# Patient Record
Sex: Female | Born: 1950 | Race: White | Hispanic: No | Smoking: Never smoker
Health system: Southern US, Community
[De-identification: ages and names within clinical notes are randomized; demographics above are authoritative.]

## PROBLEM LIST (undated history)

## (undated) DIAGNOSIS — Z974 Presence of external hearing-aid: Secondary | ICD-10-CM

## (undated) DIAGNOSIS — R198 Other specified symptoms and signs involving the digestive system and abdomen: Secondary | ICD-10-CM

## (undated) DIAGNOSIS — C55 Malignant neoplasm of uterus, part unspecified: Secondary | ICD-10-CM

## (undated) DIAGNOSIS — C801 Malignant (primary) neoplasm, unspecified: Secondary | ICD-10-CM

## (undated) HISTORY — PX: ABDOMINAL HYSTERECTOMY: SHX81

## (undated) HISTORY — PX: TONSILLECTOMY: SUR1361

## (undated) HISTORY — DX: Malignant (primary) neoplasm, unspecified: C80.1

## (undated) HISTORY — DX: Malignant neoplasm of uterus, part unspecified: C55

## (undated) HISTORY — DX: Other specified symptoms and signs involving the digestive system and abdomen: R19.8

---

## 2010-12-06 DIAGNOSIS — C801 Malignant (primary) neoplasm, unspecified: Secondary | ICD-10-CM

## 2010-12-06 HISTORY — DX: Malignant (primary) neoplasm, unspecified: C80.1

## 2011-04-06 ENCOUNTER — Ambulatory Visit: Payer: Self-pay | Admitting: Gynecologic Oncology

## 2011-04-30 ENCOUNTER — Ambulatory Visit: Payer: Self-pay | Admitting: Obstetrics and Gynecology

## 2011-05-04 ENCOUNTER — Ambulatory Visit: Payer: Self-pay | Admitting: Gynecologic Oncology

## 2011-05-05 LAB — CA 125: CA 125: 12.2 U/mL (ref 0.0–34.0)

## 2011-05-07 ENCOUNTER — Ambulatory Visit: Payer: Self-pay | Admitting: Gynecologic Oncology

## 2011-05-18 ENCOUNTER — Ambulatory Visit: Payer: Self-pay | Admitting: Obstetrics and Gynecology

## 2011-05-25 ENCOUNTER — Inpatient Hospital Stay: Payer: Self-pay | Admitting: Gynecologic Oncology

## 2011-05-25 ENCOUNTER — Encounter: Payer: Self-pay | Admitting: Oncology

## 2011-06-06 ENCOUNTER — Ambulatory Visit: Payer: Self-pay | Admitting: Gynecologic Oncology

## 2011-06-24 ENCOUNTER — Ambulatory Visit: Payer: Self-pay | Admitting: Vascular Surgery

## 2011-07-07 ENCOUNTER — Ambulatory Visit: Payer: Self-pay | Admitting: Gynecologic Oncology

## 2011-08-07 ENCOUNTER — Ambulatory Visit: Payer: Self-pay | Admitting: Gynecologic Oncology

## 2011-09-06 ENCOUNTER — Ambulatory Visit: Payer: Self-pay | Admitting: Gynecologic Oncology

## 2011-09-26 ENCOUNTER — Emergency Department: Payer: Self-pay | Admitting: Emergency Medicine

## 2011-09-28 ENCOUNTER — Inpatient Hospital Stay: Payer: Self-pay | Admitting: Internal Medicine

## 2011-10-07 ENCOUNTER — Ambulatory Visit: Payer: Self-pay | Admitting: Gynecologic Oncology

## 2011-11-06 ENCOUNTER — Ambulatory Visit: Payer: Self-pay | Admitting: Gynecologic Oncology

## 2011-12-07 ENCOUNTER — Ambulatory Visit: Payer: Self-pay | Admitting: Gynecologic Oncology

## 2011-12-07 DIAGNOSIS — R198 Other specified symptoms and signs involving the digestive system and abdomen: Secondary | ICD-10-CM

## 2011-12-07 HISTORY — DX: Other specified symptoms and signs involving the digestive system and abdomen: R19.8

## 2011-12-08 ENCOUNTER — Ambulatory Visit: Payer: Self-pay | Admitting: Obstetrics and Gynecology

## 2011-12-08 LAB — CBC CANCER CENTER
Basophil #: 0 x10 3/mm (ref 0.0–0.1)
Eosinophil #: 0.5 x10 3/mm (ref 0.0–0.7)
HCT: 33.7 % — ABNORMAL LOW (ref 35.0–47.0)
HGB: 11.8 g/dL — ABNORMAL LOW (ref 12.0–16.0)
Lymphocyte %: 12.5 %
MCH: 31.6 pg (ref 26.0–34.0)
MCHC: 35 g/dL (ref 32.0–36.0)
Monocyte %: 6.9 %
Neutrophil #: 2.4 x10 3/mm (ref 1.4–6.5)
Neutrophil %: 65.9 %
Platelet: 188 x10 3/mm (ref 150–440)
RBC: 3.73 10*6/uL — ABNORMAL LOW (ref 3.80–5.20)
RDW: 13.4 % (ref 11.5–14.5)

## 2011-12-08 LAB — COMPREHENSIVE METABOLIC PANEL
Alkaline Phosphatase: 109 U/L (ref 50–136)
Anion Gap: 8 (ref 7–16)
BUN: 17 mg/dL (ref 7–18)
Co2: 28 mmol/L (ref 21–32)
Creatinine: 0.68 mg/dL (ref 0.60–1.30)
EGFR (African American): >60
EGFR (Non-African Amer.): >60
Glucose: 83 mg/dL (ref 65–99)
Osmolality: 276 (ref 275–301)
SGOT(AST): 27 U/L (ref 15–37)
SGPT (ALT): 44 U/L
Total Protein: 6.7 g/dL (ref 6.4–8.2)

## 2011-12-15 LAB — CBC CANCER CENTER
Eosinophil #: 0.3 x10 3/mm (ref 0.0–0.7)
Eosinophil %: 9.2 %
MCH: 31.2 pg (ref 26.0–34.0)
Monocyte #: 0.3 x10 3/mm (ref 0.0–0.7)
Neutrophil #: 2.4 x10 3/mm (ref 1.4–6.5)
Neutrophil %: 68.7 %
Platelet: 183 x10 3/mm (ref 150–440)
RBC: 3.76 10*6/uL — ABNORMAL LOW (ref 3.80–5.20)
RDW: 13.3 % (ref 11.5–14.5)

## 2012-01-07 ENCOUNTER — Ambulatory Visit: Payer: Self-pay | Admitting: Gynecologic Oncology

## 2012-02-03 ENCOUNTER — Ambulatory Visit: Payer: Self-pay | Admitting: Gastroenterology

## 2012-02-04 ENCOUNTER — Ambulatory Visit: Payer: Self-pay | Admitting: Gynecologic Oncology

## 2012-02-07 LAB — CBC CANCER CENTER
Basophil %: 0.3 %
Eosinophil %: 3.8 %
HGB: 11.2 g/dL — ABNORMAL LOW (ref 12.0–16.0)
Lymphocyte #: 0.3 x10 3/mm — ABNORMAL LOW (ref 1.0–3.6)
MCV: 89 fL (ref 80–100)
Monocyte #: 0.3 x10 3/mm (ref 0.0–0.7)
Neutrophil %: 78.7 %
Platelet: 191 x10 3/mm (ref 150–440)
RBC: 3.59 10*6/uL — ABNORMAL LOW (ref 3.80–5.20)
WBC: 3.6 x10 3/mm (ref 3.6–11.0)

## 2012-02-07 LAB — COMPREHENSIVE METABOLIC PANEL
Albumin: 3.6 g/dL (ref 3.4–5.0)
Anion Gap: 9 (ref 7–16)
BUN: 12 mg/dL (ref 7–18)
Calcium, Total: 8.4 mg/dL — ABNORMAL LOW (ref 8.5–10.1)
Glucose: 91 mg/dL (ref 65–99)
Osmolality: 282 (ref 275–301)
Potassium: 3.4 mmol/L — ABNORMAL LOW (ref 3.5–5.1)
Sodium: 142 mmol/L (ref 136–145)
Total Protein: 6.6 g/dL (ref 6.4–8.2)

## 2012-02-08 LAB — CA 125: CA 125: 7 U/mL (ref 0.0–34.0)

## 2012-03-06 ENCOUNTER — Ambulatory Visit: Payer: Self-pay | Admitting: Gynecologic Oncology

## 2012-04-24 ENCOUNTER — Ambulatory Visit: Payer: Self-pay | Admitting: Oncology

## 2012-04-24 LAB — URINALYSIS, COMPLETE
Bacteria: NONE SEEN
Glucose,UR: NEGATIVE mg/dL (ref 0–75)
Ketone: NEGATIVE
Leukocyte Esterase: NEGATIVE
Protein: NEGATIVE
RBC,UR: NONE SEEN /HPF (ref 0–5)
Squamous Epithelial: NONE SEEN
WBC UR: <1 /HPF (ref 0–5)

## 2012-04-28 LAB — BASIC METABOLIC PANEL
BUN: 14 mg/dL (ref 7–18)
Chloride: 102 mmol/L (ref 98–107)
Co2: 26 mmol/L (ref 21–32)
EGFR (African American): >60
EGFR (Non-African Amer.): >60
Glucose: 95 mg/dL (ref 65–99)
Osmolality: 276 (ref 275–301)
Potassium: 4.2 mmol/L (ref 3.5–5.1)
Sodium: 138 mmol/L (ref 136–145)

## 2012-04-28 LAB — CBC CANCER CENTER
Basophil %: 1.1 %
Eosinophil #: 0.2 x10 3/mm (ref 0.0–0.7)
Eosinophil %: 5.6 %
HCT: 37.9 % (ref 35.0–47.0)
Lymphocyte %: 17.1 %
Monocyte #: 0.3 x10 3/mm (ref 0.2–0.9)
Monocyte %: 8.4 %
Neutrophil #: 2 x10 3/mm (ref 1.4–6.5)
Platelet: 199 x10 3/mm (ref 150–440)
RBC: 4.22 10*6/uL (ref 3.80–5.20)
RDW: 12.9 % (ref 11.5–14.5)
WBC: 3 x10 3/mm — ABNORMAL LOW (ref 3.6–11.0)

## 2012-04-28 LAB — MAGNESIUM: Magnesium: 2 mg/dL

## 2012-05-06 ENCOUNTER — Ambulatory Visit: Payer: Self-pay | Admitting: Oncology

## 2012-05-16 LAB — COMPREHENSIVE METABOLIC PANEL
Albumin: 3.6 g/dL (ref 3.4–5.0)
Alkaline Phosphatase: 72 U/L (ref 50–136)
BUN: 12 mg/dL (ref 7–18)
Chloride: 104 mmol/L (ref 98–107)
Co2: 25 mmol/L (ref 21–32)
EGFR (African American): >60
Glucose: 100 mg/dL — ABNORMAL HIGH (ref 65–99)
SGOT(AST): 30 U/L (ref 15–37)
SGPT (ALT): 32 U/L
Sodium: 140 mmol/L (ref 136–145)

## 2012-05-16 LAB — CBC CANCER CENTER
Eosinophil #: 0.2 x10 3/mm (ref 0.0–0.7)
Eosinophil %: 4.6 %
Lymphocyte #: 0.5 x10 3/mm — ABNORMAL LOW (ref 1.0–3.6)
MCH: 29.8 pg (ref 26.0–34.0)
MCHC: 32.7 g/dL (ref 32.0–36.0)
Monocyte #: 0.3 x10 3/mm (ref 0.2–0.9)
Neutrophil #: 2.6 x10 3/mm (ref 1.4–6.5)
Platelet: 204 x10 3/mm (ref 150–440)
RBC: 4.17 10*6/uL (ref 3.80–5.20)
RDW: 13.6 % (ref 11.5–14.5)
WBC: 3.5 x10 3/mm — ABNORMAL LOW (ref 3.6–11.0)

## 2012-06-05 ENCOUNTER — Ambulatory Visit: Payer: Self-pay | Admitting: Oncology

## 2012-06-08 ENCOUNTER — Inpatient Hospital Stay: Payer: Self-pay | Admitting: Family Medicine

## 2012-06-08 LAB — URINALYSIS, COMPLETE
Bilirubin,UR: NEGATIVE
Blood: NEGATIVE
Granular Cast: 83
Hyaline Cast: 17
Ketone: NEGATIVE
Ph: 5 (ref 4.5–8.0)
RBC,UR: 1 /HPF (ref 0–5)
Specific Gravity: 1.017 (ref 1.003–1.030)
Squamous Epithelial: 2

## 2012-06-08 LAB — CBC
HCT: 40.7 % (ref 35.0–47.0)
RDW: 13.4 % (ref 11.5–14.5)

## 2012-06-08 LAB — COMPREHENSIVE METABOLIC PANEL
Alkaline Phosphatase: 89 U/L (ref 50–136)
Bilirubin,Total: 0.4 mg/dL (ref 0.2–1.0)
Calcium, Total: 9.5 mg/dL (ref 8.5–10.1)
Co2: 23 mmol/L (ref 21–32)
Creatinine: 0.96 mg/dL (ref 0.60–1.30)
EGFR (African American): >60
EGFR (Non-African Amer.): >60
Glucose: 122 mg/dL — ABNORMAL HIGH (ref 65–99)
Osmolality: 280 (ref 275–301)
SGOT(AST): 28 U/L (ref 15–37)
Sodium: 139 mmol/L (ref 136–145)

## 2012-06-10 LAB — CBC WITH DIFFERENTIAL/PLATELET
Eosinophil %: 4.8 %
Lymphocyte #: 0.7 10*3/uL — ABNORMAL LOW (ref 1.0–3.6)
MCHC: 34.3 g/dL (ref 32.0–36.0)
MCV: 90 fL (ref 80–100)
Monocyte %: 9.8 %
Neutrophil %: 56.6 %
Platelet: 168 10*3/uL (ref 150–440)
RBC: 3.45 10*6/uL — ABNORMAL LOW (ref 3.80–5.20)
RDW: 13.3 % (ref 11.5–14.5)
WBC: 2.3 10*3/uL — ABNORMAL LOW (ref 3.6–11.0)

## 2012-06-10 LAB — BASIC METABOLIC PANEL
BUN: 5 mg/dL — ABNORMAL LOW (ref 7–18)
Calcium, Total: 8.1 mg/dL — ABNORMAL LOW (ref 8.5–10.1)
Co2: 24 mmol/L (ref 21–32)
Creatinine: 0.69 mg/dL (ref 0.60–1.30)
EGFR (African American): >60
Potassium: 3.6 mmol/L (ref 3.5–5.1)
Sodium: 144 mmol/L (ref 136–145)

## 2012-06-11 LAB — CBC WITH DIFFERENTIAL/PLATELET
Basophil #: 0 10*3/uL (ref 0.0–0.1)
Basophil %: 0.8 %
Eosinophil #: 0.1 10*3/uL (ref 0.0–0.7)
Eosinophil %: 5.3 %
HCT: 31.3 % — ABNORMAL LOW (ref 35.0–47.0)
HGB: 10.8 g/dL — ABNORMAL LOW (ref 12.0–16.0)
Lymphocyte #: 0.6 10*3/uL — ABNORMAL LOW (ref 1.0–3.6)
MCH: 30.7 pg (ref 26.0–34.0)
MCV: 89 fL (ref 80–100)
Monocyte #: 0.2 x10 3/mm (ref 0.2–0.9)
Monocyte %: 9.7 %
Neutrophil #: 1.6 10*3/uL (ref 1.4–6.5)
RBC: 3.52 10*6/uL — ABNORMAL LOW (ref 3.80–5.20)
WBC: 2.5 10*3/uL — ABNORMAL LOW (ref 3.6–11.0)

## 2012-06-11 LAB — BASIC METABOLIC PANEL
Anion Gap: 6 — ABNORMAL LOW (ref 7–16)
Calcium, Total: 8.5 mg/dL (ref 8.5–10.1)
Co2: 28 mmol/L (ref 21–32)
EGFR (African American): >60
Osmolality: 283 (ref 275–301)

## 2012-06-12 LAB — CBC WITH DIFFERENTIAL/PLATELET
Basophil #: 0 10*3/uL (ref 0.0–0.1)
Basophil %: 0.8 %
Eosinophil #: 0.2 10*3/uL (ref 0.0–0.7)
Eosinophil %: 6 %
HCT: 33.8 % — ABNORMAL LOW (ref 35.0–47.0)
Lymphocyte #: 0.7 10*3/uL — ABNORMAL LOW (ref 1.0–3.6)
Lymphocyte %: 21.4 %
MCH: 30.4 pg (ref 26.0–34.0)
MCHC: 33.9 g/dL (ref 32.0–36.0)
MCV: 90 fL (ref 80–100)
Neutrophil #: 1.9 10*3/uL (ref 1.4–6.5)
RDW: 13.2 % (ref 11.5–14.5)

## 2012-06-12 LAB — BASIC METABOLIC PANEL
Anion Gap: 11 (ref 7–16)
BUN: 6 mg/dL — ABNORMAL LOW (ref 7–18)
Calcium, Total: 8.8 mg/dL (ref 8.5–10.1)
Chloride: 106 mmol/L (ref 98–107)
Co2: 28 mmol/L (ref 21–32)
Creatinine: 0.72 mg/dL (ref 0.60–1.30)
EGFR (African American): >60
Osmolality: 285 (ref 275–301)
Sodium: 145 mmol/L (ref 136–145)

## 2012-06-13 LAB — CBC WITH DIFFERENTIAL/PLATELET
Basophil #: 0 10*3/uL (ref 0.0–0.1)
Eosinophil #: 0.2 10*3/uL (ref 0.0–0.7)
HCT: 33.6 % — ABNORMAL LOW (ref 35.0–47.0)
HGB: 11.4 g/dL — ABNORMAL LOW (ref 12.0–16.0)
Lymphocyte %: 15.8 %
MCH: 30.1 pg (ref 26.0–34.0)
MCHC: 33.9 g/dL (ref 32.0–36.0)
Monocyte %: 8 %
Neutrophil #: 2.1 10*3/uL (ref 1.4–6.5)
Neutrophil %: 69.5 %
Platelet: 190 10*3/uL (ref 150–440)
RDW: 13.2 % (ref 11.5–14.5)

## 2012-06-13 LAB — BASIC METABOLIC PANEL
BUN: 8 mg/dL (ref 7–18)
Calcium, Total: 8.4 mg/dL — ABNORMAL LOW (ref 8.5–10.1)
Chloride: 108 mmol/L — ABNORMAL HIGH (ref 98–107)
Co2: 29 mmol/L (ref 21–32)
Creatinine: 0.91 mg/dL (ref 0.60–1.30)
EGFR (African American): >60
Potassium: 3.8 mmol/L (ref 3.5–5.1)

## 2012-06-14 LAB — CBC WITH DIFFERENTIAL/PLATELET
Basophil #: 0 10*3/uL (ref 0.0–0.1)
Basophil %: 1 %
Eosinophil %: 7.2 %
HCT: 31.3 % — ABNORMAL LOW (ref 35.0–47.0)
Lymphocyte #: 0.6 10*3/uL — ABNORMAL LOW (ref 1.0–3.6)
MCV: 89 fL (ref 80–100)
Monocyte #: 0.3 x10 3/mm (ref 0.2–0.9)
Monocyte %: 9.9 %
Neutrophil #: 1.6 10*3/uL (ref 1.4–6.5)
Neutrophil %: 59.9 %
Platelet: 171 10*3/uL (ref 150–440)
RBC: 3.53 10*6/uL — ABNORMAL LOW (ref 3.80–5.20)
RDW: 13.2 % (ref 11.5–14.5)
WBC: 2.7 10*3/uL — ABNORMAL LOW (ref 3.6–11.0)

## 2012-06-14 LAB — COMPREHENSIVE METABOLIC PANEL
Albumin: 3 g/dL — ABNORMAL LOW (ref 3.4–5.0)
Alkaline Phosphatase: 75 U/L (ref 50–136)
Anion Gap: 6 — ABNORMAL LOW (ref 7–16)
BUN: 5 mg/dL — ABNORMAL LOW (ref 7–18)
Calcium, Total: 8.2 mg/dL — ABNORMAL LOW (ref 8.5–10.1)
EGFR (Non-African Amer.): >60
Glucose: 78 mg/dL (ref 65–99)
SGOT(AST): 40 U/L — ABNORMAL HIGH (ref 15–37)
SGPT (ALT): 42 U/L
Total Protein: 5.7 g/dL — ABNORMAL LOW (ref 6.4–8.2)

## 2012-06-14 LAB — LIPASE, BLOOD: Lipase: 60 U/L — ABNORMAL LOW (ref 73–393)

## 2012-06-15 LAB — CBC WITH DIFFERENTIAL/PLATELET
Basophil #: 0 x10 3/mm 3
Basophil %: 0.3 %
Eosinophil #: 0.2 x10 3/mm 3
Eosinophil %: 3.7 %
HCT: 33.1 % — ABNORMAL LOW
HGB: 11.3 g/dL — ABNORMAL LOW
Lymphocyte %: 12 %
Lymphs Abs: 0.6 x10 3/mm 3 — ABNORMAL LOW
MCH: 30.3 pg
MCHC: 34.1 g/dL
MCV: 89 fL
Monocyte #: 0.4 "x10 3/mm "
Monocyte %: 6.9 %
Neutrophil #: 4 x10 3/mm 3
Neutrophil %: 77.1 %
Platelet: 174 x10 3/mm 3
RBC: 3.72 X10 6/mm 3 — ABNORMAL LOW
RDW: 13.3 %
WBC: 5.2 x10 3/mm 3

## 2012-06-15 LAB — BASIC METABOLIC PANEL
Anion Gap: 6 — ABNORMAL LOW (ref 7–16)
BUN: 6 mg/dL — ABNORMAL LOW (ref 7–18)
Chloride: 107 mmol/L (ref 98–107)
Creatinine: 0.74 mg/dL (ref 0.60–1.30)
Osmolality: 283 (ref 275–301)
Sodium: 144 mmol/L (ref 136–145)

## 2012-06-16 LAB — BASIC METABOLIC PANEL
Anion Gap: 7 (ref 7–16)
BUN: 8 mg/dL (ref 7–18)
Calcium, Total: 8.8 mg/dL (ref 8.5–10.1)
Co2: 29 mmol/L (ref 21–32)
Creatinine: 0.76 mg/dL (ref 0.60–1.30)
EGFR (African American): >60
Glucose: 88 mg/dL (ref 65–99)
Sodium: 144 mmol/L (ref 136–145)

## 2012-06-16 LAB — CBC WITH DIFFERENTIAL/PLATELET
Basophil #: 0.1 10*3/uL (ref 0.0–0.1)
Eosinophil #: 0.2 10*3/uL (ref 0.0–0.7)
Eosinophil %: 5.3 %
Lymphocyte #: 0.6 10*3/uL — ABNORMAL LOW (ref 1.0–3.6)
Lymphocyte %: 18.4 %
MCH: 30.8 pg (ref 26.0–34.0)
MCHC: 34.7 g/dL (ref 32.0–36.0)
Monocyte #: 0.3 x10 3/mm (ref 0.2–0.9)
Neutrophil #: 2.1 10*3/uL (ref 1.4–6.5)
Neutrophil %: 65.8 %
RBC: 3.64 10*6/uL — ABNORMAL LOW (ref 3.80–5.20)
RDW: 13.1 % (ref 11.5–14.5)
WBC: 3.3 10*3/uL — ABNORMAL LOW (ref 3.6–11.0)

## 2012-06-17 LAB — COMPREHENSIVE METABOLIC PANEL
Alkaline Phosphatase: 128 U/L (ref 50–136)
Anion Gap: 8 (ref 7–16)
Bilirubin,Total: 0.3 mg/dL (ref 0.2–1.0)
Calcium, Total: 8.5 mg/dL (ref 8.5–10.1)
Chloride: 108 mmol/L — ABNORMAL HIGH (ref 98–107)
Creatinine: 0.79 mg/dL (ref 0.60–1.30)
EGFR (African American): >60
EGFR (Non-African Amer.): >60
Glucose: 81 mg/dL (ref 65–99)
Osmolality: 286 (ref 275–301)
Potassium: 3.6 mmol/L (ref 3.5–5.1)
SGOT(AST): 97 U/L — ABNORMAL HIGH (ref 15–37)
SGPT (ALT): 80 U/L — ABNORMAL HIGH
Total Protein: 6.2 g/dL — ABNORMAL LOW (ref 6.4–8.2)

## 2012-06-17 LAB — CBC WITH DIFFERENTIAL/PLATELET
Basophil #: 0 10*3/uL (ref 0.0–0.1)
Eosinophil #: 0.1 10*3/uL (ref 0.0–0.7)
HCT: 33.2 % — ABNORMAL LOW (ref 35.0–47.0)
Lymphocyte #: 0.7 10*3/uL — ABNORMAL LOW (ref 1.0–3.6)
Lymphocyte %: 21.4 %
MCHC: 34.6 g/dL (ref 32.0–36.0)
MCV: 89 fL (ref 80–100)
Monocyte %: 10.3 %
Neutrophil #: 2 10*3/uL (ref 1.4–6.5)
Platelet: 172 10*3/uL (ref 150–440)
RDW: 13.6 % (ref 11.5–14.5)

## 2012-06-19 LAB — WBCS, STOOL

## 2012-06-29 LAB — COMPREHENSIVE METABOLIC PANEL
Albumin: 3.8 g/dL (ref 3.4–5.0)
Alkaline Phosphatase: 169 U/L — ABNORMAL HIGH (ref 50–136)
Anion Gap: 11 (ref 7–16)
BUN: 15 mg/dL (ref 7–18)
Calcium, Total: 8.9 mg/dL (ref 8.5–10.1)
Co2: 24 mmol/L (ref 21–32)
EGFR (African American): >60
Glucose: 87 mg/dL (ref 65–99)
Potassium: 4.2 mmol/L (ref 3.5–5.1)
SGOT(AST): 61 U/L — ABNORMAL HIGH (ref 15–37)
SGPT (ALT): 96 U/L — ABNORMAL HIGH
Total Protein: 6.8 g/dL (ref 6.4–8.2)

## 2012-06-29 LAB — CBC CANCER CENTER
Basophil %: 1 %
Eosinophil %: 3.6 %
HGB: 12.2 g/dL (ref 12.0–16.0)
Lymphocyte #: 0.6 x10 3/mm — ABNORMAL LOW (ref 1.0–3.6)
MCH: 30.9 pg (ref 26.0–34.0)
MCHC: 34.7 g/dL (ref 32.0–36.0)
MCV: 89 fL (ref 80–100)
Neutrophil %: 65.8 %
RBC: 3.96 10*6/uL (ref 3.80–5.20)
WBC: 3.2 x10 3/mm — ABNORMAL LOW (ref 3.6–11.0)

## 2012-06-30 LAB — CA 125: CA 125: 13.2 U/mL (ref 0.0–34.0)

## 2012-07-06 ENCOUNTER — Ambulatory Visit: Payer: Self-pay | Admitting: Oncology

## 2012-07-19 LAB — CA 125: CA 125: 12.1 U/mL (ref 0.0–34.0)

## 2012-08-06 ENCOUNTER — Ambulatory Visit: Payer: Self-pay | Admitting: Oncology

## 2012-08-22 LAB — COMPREHENSIVE METABOLIC PANEL
Albumin: 3.8 g/dL (ref 3.4–5.0)
Alkaline Phosphatase: 173 U/L — ABNORMAL HIGH (ref 50–136)
BUN: 13 mg/dL (ref 7–18)
Calcium, Total: 9 mg/dL (ref 8.5–10.1)
Co2: 29 mmol/L (ref 21–32)
EGFR (Non-African Amer.): >60
Glucose: 89 mg/dL (ref 65–99)
Osmolality: 275 (ref 275–301)
SGOT(AST): 85 U/L — ABNORMAL HIGH (ref 15–37)
SGPT (ALT): 127 U/L — ABNORMAL HIGH (ref 12–78)
Sodium: 138 mmol/L (ref 136–145)

## 2012-08-22 LAB — CBC CANCER CENTER
Basophil #: 0 x10 3/mm (ref 0.0–0.1)
Eosinophil #: 0.1 x10 3/mm (ref 0.0–0.7)
HCT: 36 % (ref 35.0–47.0)
HGB: 11.9 g/dL — ABNORMAL LOW (ref 12.0–16.0)
Lymphocyte %: 17.3 %
MCHC: 33.1 g/dL (ref 32.0–36.0)
Monocyte %: 9 %
Neutrophil #: 2.3 x10 3/mm (ref 1.4–6.5)
Neutrophil %: 69.7 %
Platelet: 207 x10 3/mm (ref 150–440)
RBC: 3.93 10*6/uL (ref 3.80–5.20)
RDW: 14 % (ref 11.5–14.5)
WBC: 3.3 x10 3/mm — ABNORMAL LOW (ref 3.6–11.0)

## 2012-09-05 ENCOUNTER — Ambulatory Visit: Payer: Self-pay | Admitting: Oncology

## 2012-09-19 LAB — IRON AND TIBC
Iron Bind.Cap.(Total): 319 ug/dL (ref 250–450)
Iron Saturation: 29 %
Iron: 94 ug/dL (ref 50–170)
Unbound Iron-Bind.Cap.: 225 ug/dL

## 2012-09-19 LAB — PROTIME-INR: Prothrombin Time: 12.2 secs (ref 11.5–14.7)

## 2012-09-19 LAB — HEPATIC FUNCTION PANEL A (ARMC)
Albumin: 3.7 g/dL (ref 3.4–5.0)
Alkaline Phosphatase: 154 U/L — ABNORMAL HIGH (ref 50–136)
Bilirubin, Direct: 0.1 mg/dL (ref 0.00–0.20)
SGPT (ALT): 98 U/L — ABNORMAL HIGH (ref 12–78)
Total Protein: 6.9 g/dL (ref 6.4–8.2)

## 2012-09-19 LAB — FERRITIN: Ferritin (ARMC): 63 ng/mL (ref 8–388)

## 2012-10-06 ENCOUNTER — Ambulatory Visit: Payer: Self-pay | Admitting: Oncology

## 2012-10-17 LAB — HEPATIC FUNCTION PANEL A (ARMC)
Albumin: 3.7 g/dL (ref 3.4–5.0)
Alkaline Phosphatase: 113 U/L (ref 50–136)
Bilirubin, Direct: 0.1 mg/dL (ref 0.00–0.20)
Bilirubin,Total: 0.3 mg/dL (ref 0.2–1.0)
SGPT (ALT): 43 U/L (ref 12–78)

## 2012-11-05 ENCOUNTER — Ambulatory Visit: Payer: Self-pay | Admitting: Oncology

## 2012-11-14 LAB — HEPATIC FUNCTION PANEL A (ARMC): Bilirubin, Direct: 0.1 mg/dL (ref 0.00–0.20)

## 2012-11-14 LAB — CBC CANCER CENTER
Basophil #: 0 x10 3/mm (ref 0.0–0.1)
Eosinophil #: 0.1 x10 3/mm (ref 0.0–0.7)
Eosinophil %: 2.8 %
HGB: 12.2 g/dL (ref 12.0–16.0)
Lymphocyte %: 18.9 %
MCHC: 34.7 g/dL (ref 32.0–36.0)
Neutrophil %: 69.9 %
RDW: 13 % (ref 11.5–14.5)

## 2012-11-14 LAB — COMPREHENSIVE METABOLIC PANEL
Alkaline Phosphatase: 109 U/L (ref 50–136)
BUN: 12 mg/dL (ref 7–18)
Chloride: 103 mmol/L (ref 98–107)
Co2: 29 mmol/L (ref 21–32)
EGFR (African American): >60
EGFR (Non-African Amer.): >60
Glucose: 69 mg/dL (ref 65–99)
SGOT(AST): 24 U/L (ref 15–37)
SGPT (ALT): 38 U/L (ref 12–78)
Sodium: 142 mmol/L (ref 136–145)
Total Protein: 6.6 g/dL (ref 6.4–8.2)

## 2012-12-06 ENCOUNTER — Ambulatory Visit: Payer: Self-pay | Admitting: Oncology

## 2013-01-06 ENCOUNTER — Ambulatory Visit: Payer: Self-pay | Admitting: Oncology

## 2013-01-16 LAB — URINALYSIS, COMPLETE
Glucose,UR: NEGATIVE mg/dL (ref 0–75)
Ketone: NEGATIVE
Ph: 5 (ref 4.5–8.0)
Protein: NEGATIVE
Specific Gravity: 1.02 (ref 1.003–1.030)
WBC UR: 1 /HPF (ref 0–5)

## 2013-01-17 LAB — URINE CULTURE

## 2013-01-17 LAB — CA 125: CA 125: 8.1 U/mL (ref 0.0–34.0)

## 2013-02-03 ENCOUNTER — Ambulatory Visit: Payer: Self-pay | Admitting: Oncology

## 2013-03-06 ENCOUNTER — Ambulatory Visit: Payer: Self-pay | Admitting: Oncology

## 2013-03-13 LAB — CBC CANCER CENTER
Basophil %: 0.8 %
Eosinophil #: 0.1 x10 3/mm (ref 0.0–0.7)
HCT: 35.9 % (ref 35.0–47.0)
Lymphocyte %: 12.4 %
MCH: 29.3 pg (ref 26.0–34.0)
MCHC: 33.5 g/dL (ref 32.0–36.0)
Monocyte #: 0.4 x10 3/mm (ref 0.2–0.9)
Monocyte %: 8 %
Neutrophil %: 76.4 %
Platelet: 258 x10 3/mm (ref 150–440)
RBC: 4.1 10*6/uL (ref 3.80–5.20)

## 2013-03-13 LAB — COMPREHENSIVE METABOLIC PANEL
Albumin: 3.4 g/dL (ref 3.4–5.0)
BUN: 10 mg/dL (ref 7–18)
Bilirubin,Total: 0.3 mg/dL (ref 0.2–1.0)
Calcium, Total: 8.6 mg/dL (ref 8.5–10.1)
Co2: 30 mmol/L (ref 21–32)
Creatinine: 0.85 mg/dL (ref 0.60–1.30)
EGFR (Non-African Amer.): >60
Glucose: 89 mg/dL (ref 65–99)
Osmolality: 272 (ref 275–301)
Potassium: 4.6 mmol/L (ref 3.5–5.1)
Sodium: 137 mmol/L (ref 136–145)

## 2013-04-03 ENCOUNTER — Ambulatory Visit: Payer: Self-pay | Admitting: Family Medicine

## 2013-04-05 ENCOUNTER — Ambulatory Visit: Payer: Self-pay | Admitting: Oncology

## 2013-04-11 ENCOUNTER — Ambulatory Visit: Payer: Self-pay | Admitting: Oncology

## 2013-04-16 ENCOUNTER — Ambulatory Visit (INDEPENDENT_AMBULATORY_CARE_PROVIDER_SITE_OTHER): Payer: BC Managed Care – PPO | Admitting: General Surgery

## 2013-04-16 ENCOUNTER — Encounter: Payer: Self-pay | Admitting: General Surgery

## 2013-04-16 ENCOUNTER — Other Ambulatory Visit: Payer: Self-pay

## 2013-04-16 VITALS — BP 120/60 | HR 72 | Resp 14 | Ht 63.0 in | Wt 161.0 lb

## 2013-04-16 DIAGNOSIS — Z8542 Personal history of malignant neoplasm of other parts of uterus: Secondary | ICD-10-CM | POA: Insufficient documentation

## 2013-04-16 DIAGNOSIS — R590 Localized enlarged lymph nodes: Secondary | ICD-10-CM

## 2013-04-16 DIAGNOSIS — R599 Enlarged lymph nodes, unspecified: Secondary | ICD-10-CM

## 2013-04-16 NOTE — Progress Notes (Signed)
Patient ID: [name] Graham, female   DOB: October 03, 1951, 62 y.o.   MRN: [id number]  Chief Complaint  Patient presents with  . New Patient    evaluation for supraclavicular biopsy    HPI [name] Graham is a 62 y.o. female who presents as a new patient. The patient is here today for an evaluation of a supraclavicular lymphnode. Patient has had PET scan done. Patient has previous history of uterine cancer with  hysterectomy followed by chemo and radiation.  HPI  Past Medical History  Diagnosis Date  . Cancer 2012  . GI problem 2013    Past Surgical History  Procedure Laterality Date  . Abdominal hysterectomy      History reviewed. No pertinent family history.  Social History History  Substance Use Topics  . Smoking status: Never Smoker   . Smokeless tobacco: Never Used  . Alcohol Use: Yes    Allergies  Allergen Reactions  . Penicillins Rash    Current Outpatient Prescriptions  Medication Sig Dispense Refill  . ALPRAZolam (XANAX) 0.25 MG tablet       . bismuth subsalicylate (PEPTO BISMOL) 262 MG chewable tablet Chew 524 mg by mouth as needed for indigestion.      . cholestyramine (QUESTRAN) 4 G packet Take 1 packet by mouth 3 (three) times daily as needed.       . fexofenadine (ALLEGRA) 180 MG tablet Take 180 mg by mouth daily.      . fluticasone (FLONASE) 50 MCG/ACT nasal spray Place 1-2 sprays into the nose daily as needed.      . ondansetron (ZOFRAN) 4 MG tablet Take 1 tablet by mouth every 4 (four) hours as needed.      Marland Kitchen oxybutynin (DITROPAN-XL) 10 MG 24 hr tablet Take 10 mg by mouth daily.      Marland Kitchen PENTASA 500 MG CR capsule Take 2 capsules by mouth 4 (four) times daily.      . ranitidine (ZANTAC) 150 MG tablet Take 1 tablet by mouth 2 (two) times daily.      . sertraline (ZOLOFT) 50 MG tablet Take 1 tablet by mouth daily.       No current facility-administered medications for this visit.    Review of Systems Review of Systems  Constitutional: Negative.   HENT:  Positive for neck pain. Negative for hearing loss, ear pain, nosebleeds, congestion, facial swelling, rhinorrhea, sneezing, neck stiffness, postnasal drip, tinnitus and ear discharge.   Respiratory: Negative.   Cardiovascular: Negative.   Gastrointestinal: Negative.     Blood pressure 120/60, pulse 72, resp. rate 14, height 5\' 3"  (1.6 m), weight 161 lb (73.029 kg).  Physical Exam Physical Exam  Constitutional: She is oriented to person, place, and time. She appears well-developed and well-nourished.  Eyes: Conjunctivae are normal. No scleral icterus.  Neck: Neck supple.  Cardiovascular: Normal rate, regular rhythm and normal heart sounds.   Pulmonary/Chest: Effort normal and breath sounds normal.  Abdominal: Soft. Bowel sounds are normal.  Lymphadenopathy:    She has cervical adenopathy.       Left cervical: Posterior cervical adenopathy present.    She has no axillary adenopathy.  One cm firm mobile node behind left sternomastoid.  Neurological: She is alert and oriented to person, place, and time.    Data Reviewed PET scan was reviewed  Assessment    Suspicious left neck lymph node     Plan    Core needle biopsy discussed with patient and she is agreeable  Procedure: core biopsy left posterior cervical  Node. Anesth: 1% xylocaine mixed with 0.5%marcaine, 6 ml Device: Bard 14 g U/S guidance -used. No immediate problems from procedure.    SANKAR,SEEPLAPUTHUR G 04/16/2013, 1:43 PM

## 2013-04-16 NOTE — Patient Instructions (Addendum)
Pt will be advised on path when available.

## 2013-04-18 LAB — PATHOLOGY

## 2013-05-02 LAB — COMPREHENSIVE METABOLIC PANEL WITH GFR
Albumin: 3.6 g/dL
Alkaline Phosphatase: 128 U/L
Anion Gap: 11
BUN: 11 mg/dL
Bilirubin,Total: 0.3 mg/dL
Calcium, Total: 9.1 mg/dL
Chloride: 100 mmol/L
Co2: 25 mmol/L
Creatinine: 0.78 mg/dL
EGFR (African American): >60
EGFR (Non-African Amer.): >60
Glucose: 88 mg/dL
Osmolality: 271
Potassium: 4.1 mmol/L
SGOT(AST): 27 U/L
SGPT (ALT): 42 U/L
Sodium: 136 mmol/L
Total Protein: 6.9 g/dL

## 2013-05-02 LAB — CBC CANCER CENTER
Basophil #: 0 "x10 3/mm "
Basophil %: 0.6 %
Eosinophil #: 0.1 "x10 3/mm "
Eosinophil %: 1.5 %
HCT: 35.5 %
HGB: 12 g/dL
Lymphocyte %: 14 %
Lymphs Abs: 0.7 "x10 3/mm " — ABNORMAL LOW
MCH: 28.9 pg
MCHC: 33.8 g/dL
MCV: 85 fL
Monocyte #: 0.3 "x10 3/mm "
Monocyte %: 5.6 %
Neutrophil #: 4.2 "x10 3/mm "
Neutrophil %: 78.3 %
Platelet: 244 "x10 3/mm "
RBC: 4.16 "x10 6/mm "
RDW: 14.2 %
WBC: 5.3 "x10 3/mm "

## 2013-05-02 LAB — IRON AND TIBC
Iron Bind.Cap.(Total): 279 ug/dL
Iron Saturation: 14 %
Iron: 38 ug/dL — ABNORMAL LOW
Unbound Iron-Bind.Cap.: 241 ug/dL

## 2013-05-02 LAB — FERRITIN: Ferritin (ARMC): 53 ng/mL (ref 8–388)

## 2013-05-04 LAB — OCCULT BLOOD X 1 CARD TO LAB, STOOL
Occult Blood, Feces: NEGATIVE
Occult Blood, Feces: NEGATIVE

## 2013-05-06 ENCOUNTER — Ambulatory Visit: Payer: Self-pay | Admitting: Oncology

## 2013-05-15 LAB — BASIC METABOLIC PANEL
Anion Gap: 7 (ref 7–16)
BUN: 5 mg/dL — ABNORMAL LOW (ref 7–18)
Calcium, Total: 8.5 mg/dL (ref 8.5–10.1)
Chloride: 103 mmol/L (ref 98–107)
Co2: 28 mmol/L (ref 21–32)
Creatinine: 0.87 mg/dL (ref 0.60–1.30)
EGFR (Non-African Amer.): >60
Glucose: 100 mg/dL — ABNORMAL HIGH (ref 65–99)
Sodium: 138 mmol/L (ref 136–145)

## 2013-05-15 LAB — MAGNESIUM: Magnesium: 1.5 mg/dL — ABNORMAL LOW

## 2013-05-15 LAB — CBC CANCER CENTER
Basophil #: 0 x10 3/mm (ref 0.0–0.1)
Basophil %: 0.2 %
Eosinophil #: 0.1 x10 3/mm (ref 0.0–0.7)
HCT: 32.4 % — ABNORMAL LOW (ref 35.0–47.0)
Lymphocyte #: 1 x10 3/mm (ref 1.0–3.6)
MCH: 29.3 pg (ref 26.0–34.0)
MCV: 86 fL (ref 80–100)
Neutrophil #: 17 x10 3/mm — ABNORMAL HIGH (ref 1.4–6.5)
Neutrophil %: 91.1 %
RBC: 3.78 10*6/uL — ABNORMAL LOW (ref 3.80–5.20)
WBC: 18.6 x10 3/mm — ABNORMAL HIGH (ref 3.6–11.0)

## 2013-05-17 LAB — CBC CANCER CENTER
Basophil #: 0.1 x10 3/mm (ref 0.0–0.1)
Basophil %: 0.5 %
Eosinophil #: 0.1 x10 3/mm (ref 0.0–0.7)
HGB: 10.2 g/dL — ABNORMAL LOW (ref 12.0–16.0)
Lymphocyte #: 1.8 x10 3/mm (ref 1.0–3.6)
MCH: 29 pg (ref 26.0–34.0)
MCHC: 33.8 g/dL (ref 32.0–36.0)
MCV: 86 fL (ref 80–100)
Neutrophil #: 14.9 x10 3/mm — ABNORMAL HIGH (ref 1.4–6.5)
Neutrophil %: 86.4 %
Platelet: 157 x10 3/mm (ref 150–440)

## 2013-05-17 LAB — URINALYSIS, COMPLETE
Bilirubin,UR: NEGATIVE
Blood: NEGATIVE
Glucose,UR: NEGATIVE mg/dL (ref 0–75)
Leukocyte Esterase: NEGATIVE
Protein: NEGATIVE
RBC,UR: <1 /HPF (ref 0–5)
Squamous Epithelial: NONE SEEN
WBC UR: 1 /HPF (ref 0–5)

## 2013-05-17 LAB — COMPREHENSIVE METABOLIC PANEL
Alkaline Phosphatase: 191 U/L — ABNORMAL HIGH (ref 50–136)
BUN: 10 mg/dL (ref 7–18)
Bilirubin,Total: 0.2 mg/dL (ref 0.2–1.0)
Co2: 28 mmol/L (ref 21–32)
Creatinine: 0.8 mg/dL (ref 0.60–1.30)
EGFR (Non-African Amer.): >60
Glucose: 88 mg/dL (ref 65–99)
Osmolality: 278 (ref 275–301)
SGOT(AST): 40 U/L — ABNORMAL HIGH (ref 15–37)
SGPT (ALT): 53 U/L (ref 12–78)
Sodium: 140 mmol/L (ref 136–145)
Total Protein: 5.8 g/dL — ABNORMAL LOW (ref 6.4–8.2)

## 2013-05-18 LAB — COMPREHENSIVE METABOLIC PANEL
Alkaline Phosphatase: 182 U/L — ABNORMAL HIGH (ref 50–136)
BUN: 9 mg/dL (ref 7–18)
Bilirubin,Total: 0.2 mg/dL (ref 0.2–1.0)
Calcium, Total: 8.3 mg/dL — ABNORMAL LOW (ref 8.5–10.1)
Glucose: 93 mg/dL (ref 65–99)
Osmolality: 278 (ref 275–301)
SGOT(AST): 31 U/L (ref 15–37)
SGPT (ALT): 57 U/L (ref 12–78)
Total Protein: 5.7 g/dL — ABNORMAL LOW (ref 6.4–8.2)

## 2013-05-18 LAB — CBC CANCER CENTER
Basophil #: 0 x10 3/mm (ref 0.0–0.1)
Basophil %: 0.3 %
Eosinophil #: 0 x10 3/mm (ref 0.0–0.7)
Eosinophil %: 0.2 %
HGB: 9.6 g/dL — ABNORMAL LOW (ref 12.0–16.0)
Lymphocyte %: 9.6 %
MCV: 86 fL (ref 80–100)
Monocyte %: 3.4 %
Platelet: 154 x10 3/mm (ref 150–440)
RDW: 14 % (ref 11.5–14.5)
WBC: 17.2 x10 3/mm — ABNORMAL HIGH (ref 3.6–11.0)

## 2013-05-18 LAB — MAGNESIUM: Magnesium: 1.8 mg/dL

## 2013-05-22 LAB — CBC CANCER CENTER
Basophil %: 0.5 %
Eosinophil %: 1.6 %
HCT: 35.9 % (ref 35.0–47.0)
Lymphocyte #: 0.8 x10 3/mm — ABNORMAL LOW (ref 1.0–3.6)
Lymphocyte %: 9.9 %
MCHC: 34.2 g/dL (ref 32.0–36.0)
MCV: 87 fL (ref 80–100)
Monocyte %: 5.5 %
Neutrophil #: 6.9 x10 3/mm — ABNORMAL HIGH (ref 1.4–6.5)
WBC: 8.4 x10 3/mm (ref 3.6–11.0)

## 2013-05-29 LAB — CBC CANCER CENTER
Basophil #: 0.1 x10 3/mm (ref 0.0–0.1)
Basophil %: 1.1 %
Eosinophil #: 0.1 x10 3/mm (ref 0.0–0.7)
Eosinophil %: 2.3 %
HGB: 11.4 g/dL — ABNORMAL LOW (ref 12.0–16.0)
Lymphocyte #: 0.6 x10 3/mm — ABNORMAL LOW (ref 1.0–3.6)
Lymphocyte %: 13.1 %
Monocyte #: 0.4 x10 3/mm (ref 0.2–0.9)
Monocyte %: 8.6 %
Neutrophil #: 3.5 x10 3/mm (ref 1.4–6.5)
Neutrophil %: 74.9 %
Platelet: 263 x10 3/mm (ref 150–440)
RDW: 15.2 % — ABNORMAL HIGH (ref 11.5–14.5)
WBC: 4.7 x10 3/mm (ref 3.6–11.0)

## 2013-06-05 ENCOUNTER — Ambulatory Visit: Payer: Self-pay | Admitting: Oncology

## 2013-06-05 LAB — CBC CANCER CENTER
Basophil %: 0.6 %
Eosinophil #: 0.3 x10 3/mm (ref 0.0–0.7)
HCT: 33.6 % — ABNORMAL LOW (ref 35.0–47.0)
Lymphocyte %: 17.1 %
MCHC: 34.5 g/dL (ref 32.0–36.0)
MCV: 87 fL (ref 80–100)
Monocyte %: 7.6 %
Neutrophil #: 3 x10 3/mm (ref 1.4–6.5)
Neutrophil %: 68.9 %
RBC: 3.85 10*6/uL (ref 3.80–5.20)
RDW: 15.9 % — ABNORMAL HIGH (ref 11.5–14.5)
WBC: 4.4 x10 3/mm (ref 3.6–11.0)

## 2013-06-12 LAB — CBC CANCER CENTER
Basophil #: 0 x10 3/mm (ref 0.0–0.1)
Basophil %: 0.9 %
Eosinophil %: 5.4 %
HGB: 11.4 g/dL — ABNORMAL LOW (ref 12.0–16.0)
Lymphocyte #: 0.7 x10 3/mm — ABNORMAL LOW (ref 1.0–3.6)
Lymphocyte %: 18.5 %
MCH: 29.9 pg (ref 26.0–34.0)
Neutrophil #: 2.5 x10 3/mm (ref 1.4–6.5)
Neutrophil %: 66 %
WBC: 3.8 x10 3/mm (ref 3.6–11.0)

## 2013-06-12 LAB — COMPREHENSIVE METABOLIC PANEL
Alkaline Phosphatase: 120 U/L (ref 50–136)
BUN: 18 mg/dL (ref 7–18)
Co2: 27 mmol/L (ref 21–32)
Creatinine: 0.82 mg/dL (ref 0.60–1.30)
EGFR (Non-African Amer.): >60
Glucose: 95 mg/dL (ref 65–99)
Osmolality: 281 (ref 275–301)
Potassium: 3.9 mmol/L (ref 3.5–5.1)
SGPT (ALT): 31 U/L (ref 12–78)
Sodium: 140 mmol/L (ref 136–145)
Total Protein: 6.7 g/dL (ref 6.4–8.2)

## 2013-06-13 LAB — CA 125: CA 125: 17.4 U/mL (ref 0.0–34.0)

## 2013-06-19 LAB — CBC CANCER CENTER
Basophil #: 0.2 x10 3/mm — ABNORMAL HIGH (ref 0.0–0.1)
Eosinophil #: 0.1 x10 3/mm (ref 0.0–0.7)
Eosinophil %: 0.9 %
HCT: 33.5 % — ABNORMAL LOW (ref 35.0–47.0)
HGB: 11.4 g/dL — ABNORMAL LOW (ref 12.0–16.0)
Lymphocyte %: 9.3 %
MCH: 29.8 pg (ref 26.0–34.0)
MCHC: 34 g/dL (ref 32.0–36.0)
MCV: 88 fL (ref 80–100)
Monocyte #: 0.8 x10 3/mm (ref 0.2–0.9)
Monocyte %: 5.6 %
Neutrophil #: 11.7 x10 3/mm — ABNORMAL HIGH (ref 1.4–6.5)
Neutrophil %: 82.7 %
RBC: 3.83 10*6/uL (ref 3.80–5.20)

## 2013-06-26 LAB — CBC CANCER CENTER
Basophil %: 0.8 %
Eosinophil #: 0.1 x10 3/mm (ref 0.0–0.7)
Eosinophil %: 1 %
HCT: 29.9 % — ABNORMAL LOW (ref 35.0–47.0)
HGB: 10.4 g/dL — ABNORMAL LOW (ref 12.0–16.0)
Lymphocyte #: 0.8 x10 3/mm — ABNORMAL LOW (ref 1.0–3.6)
Lymphocyte %: 11.2 %
MCH: 30.6 pg (ref 26.0–34.0)
MCHC: 34.9 g/dL (ref 32.0–36.0)
MCV: 88 fL (ref 80–100)
Neutrophil %: 81.3 %
Platelet: 207 x10 3/mm (ref 150–440)
RBC: 3.41 10*6/uL — ABNORMAL LOW (ref 3.80–5.20)

## 2013-07-03 LAB — CBC CANCER CENTER
Basophil #: 0 x10 3/mm (ref 0.0–0.1)
Basophil %: 1.3 %
Eosinophil #: 0.1 x10 3/mm (ref 0.0–0.7)
Eosinophil %: 4.2 %
HCT: 30.9 % — ABNORMAL LOW (ref 35.0–47.0)
HGB: 10.8 g/dL — ABNORMAL LOW (ref 12.0–16.0)
Lymphocyte #: 0.5 x10 3/mm — ABNORMAL LOW (ref 1.0–3.6)
Lymphocyte %: 15.9 %
MCH: 30.9 pg (ref 26.0–34.0)
MCHC: 34.8 g/dL (ref 32.0–36.0)
MCV: 89 fL (ref 80–100)
Monocyte #: 0.3 x10 3/mm (ref 0.2–0.9)
Monocyte %: 8.8 %
Neutrophil #: 2.4 x10 3/mm (ref 1.4–6.5)
Neutrophil %: 69.8 %
Platelet: 233 x10 3/mm (ref 150–440)
RBC: 3.49 10*6/uL — ABNORMAL LOW (ref 3.80–5.20)
RDW: 16.7 % — ABNORMAL HIGH (ref 11.5–14.5)
WBC: 3.4 x10 3/mm — ABNORMAL LOW (ref 3.6–11.0)

## 2013-07-03 LAB — POTASSIUM: Potassium: 4.1 mmol/L (ref 3.5–5.1)

## 2013-07-06 ENCOUNTER — Ambulatory Visit: Payer: Self-pay | Admitting: Oncology

## 2013-07-10 LAB — CBC CANCER CENTER
Basophil #: 0 x10 3/mm (ref 0.0–0.1)
Basophil %: 0.9 %
Eosinophil #: 0.2 x10 3/mm (ref 0.0–0.7)
Eosinophil %: 4.4 %
HGB: 11.6 g/dL — ABNORMAL LOW (ref 12.0–16.0)
MCH: 30.6 pg (ref 26.0–34.0)
MCHC: 34.7 g/dL (ref 32.0–36.0)
MCV: 88 fL (ref 80–100)
Monocyte #: 0.3 x10 3/mm (ref 0.2–0.9)
Monocyte %: 8.3 %
Neutrophil #: 2.7 x10 3/mm (ref 1.4–6.5)
Platelet: 241 x10 3/mm (ref 150–440)
RBC: 3.78 10*6/uL — ABNORMAL LOW (ref 3.80–5.20)
WBC: 3.7 x10 3/mm (ref 3.6–11.0)

## 2013-07-10 LAB — COMPREHENSIVE METABOLIC PANEL
Albumin: 3.8 g/dL (ref 3.4–5.0)
BUN: 18 mg/dL (ref 7–18)
Bilirubin,Total: 0.3 mg/dL (ref 0.2–1.0)
Calcium, Total: 9.3 mg/dL (ref 8.5–10.1)
Co2: 26 mmol/L (ref 21–32)
Creatinine: 0.92 mg/dL (ref 0.60–1.30)
EGFR (Non-African Amer.): >60
Glucose: 90 mg/dL (ref 65–99)
Osmolality: 277 (ref 275–301)
SGOT(AST): 19 U/L (ref 15–37)
SGPT (ALT): 23 U/L (ref 12–78)
Sodium: 138 mmol/L (ref 136–145)

## 2013-07-11 LAB — CA 125: CA 125: 16.2 U/mL (ref 0.0–34.0)

## 2013-07-17 LAB — CBC CANCER CENTER
Basophil #: 0 x10 3/mm (ref 0.0–0.1)
Eosinophil %: 0.5 %
HCT: 28.8 % — ABNORMAL LOW (ref 35.0–47.0)
HGB: 10.2 g/dL — ABNORMAL LOW (ref 12.0–16.0)
Lymphocyte #: 0.5 x10 3/mm — ABNORMAL LOW (ref 1.0–3.6)
MCHC: 35.3 g/dL (ref 32.0–36.0)
MCV: 89 fL (ref 80–100)
Monocyte #: 0.2 x10 3/mm (ref 0.2–0.9)
Platelet: 96 x10 3/mm — ABNORMAL LOW (ref 150–440)
RBC: 3.25 10*6/uL — ABNORMAL LOW (ref 3.80–5.20)
WBC: 10.5 x10 3/mm (ref 3.6–11.0)

## 2013-07-24 ENCOUNTER — Ambulatory Visit: Payer: Self-pay | Admitting: Oncology

## 2013-07-24 LAB — CBC CANCER CENTER
Eosinophil %: 0.6 %
HGB: 10.5 g/dL — ABNORMAL LOW (ref 12.0–16.0)
Lymphocyte #: 0.7 x10 3/mm — ABNORMAL LOW (ref 1.0–3.6)
Lymphocyte %: 12.3 %
MCH: 30.6 pg (ref 26.0–34.0)
MCHC: 34.3 g/dL (ref 32.0–36.0)
Monocyte %: 4.7 %
Neutrophil %: 81.8 %
Platelet: 178 x10 3/mm (ref 150–440)
RBC: 3.44 10*6/uL — ABNORMAL LOW (ref 3.80–5.20)
RDW: 15.5 % — ABNORMAL HIGH (ref 11.5–14.5)
WBC: 5.9 x10 3/mm (ref 3.6–11.0)

## 2013-07-31 LAB — CBC CANCER CENTER
Basophil %: 1 %
Eosinophil %: 2.1 %
HGB: 10.9 g/dL — ABNORMAL LOW (ref 12.0–16.0)
Lymphocyte #: 1 x10 3/mm (ref 1.0–3.6)
MCHC: 35.1 g/dL (ref 32.0–36.0)
Monocyte #: 0.4 x10 3/mm (ref 0.2–0.9)
Neutrophil #: 3.7 x10 3/mm (ref 1.4–6.5)
Neutrophil %: 69.5 %
Platelet: 223 x10 3/mm (ref 150–440)
RBC: 3.45 10*6/uL — ABNORMAL LOW (ref 3.80–5.20)
RDW: 15.8 % — ABNORMAL HIGH (ref 11.5–14.5)

## 2013-08-06 ENCOUNTER — Ambulatory Visit: Payer: Self-pay | Admitting: Oncology

## 2013-08-07 LAB — CBC CANCER CENTER
Basophil #: 0 x10 3/mm (ref 0.0–0.1)
Basophil %: 0.7 %
Eosinophil #: 0.2 x10 3/mm (ref 0.0–0.7)
Eosinophil %: 3.7 %
Lymphocyte #: 0.6 x10 3/mm — ABNORMAL LOW (ref 1.0–3.6)
MCH: 30.9 pg (ref 26.0–34.0)
MCHC: 34.4 g/dL (ref 32.0–36.0)
Monocyte #: 0.4 x10 3/mm (ref 0.2–0.9)
Monocyte %: 8.9 %
Neutrophil #: 3 x10 3/mm (ref 1.4–6.5)
Neutrophil %: 72.3 %
RBC: 3.55 10*6/uL — ABNORMAL LOW (ref 3.80–5.20)
RDW: 15.5 % — ABNORMAL HIGH (ref 11.5–14.5)

## 2013-08-07 LAB — COMPREHENSIVE METABOLIC PANEL
Anion Gap: 9 (ref 7–16)
BUN: 14 mg/dL (ref 7–18)
Co2: 27 mmol/L (ref 21–32)
Creatinine: 1.05 mg/dL (ref 0.60–1.30)
EGFR (African American): >60
Glucose: 98 mg/dL (ref 65–99)
Potassium: 3.8 mmol/L (ref 3.5–5.1)
SGPT (ALT): 24 U/L (ref 12–78)
Sodium: 139 mmol/L (ref 136–145)
Total Protein: 6.3 g/dL — ABNORMAL LOW (ref 6.4–8.2)

## 2013-08-15 LAB — CBC CANCER CENTER
Basophil #: 0.1 x10 3/mm (ref 0.0–0.1)
Eosinophil #: 0.1 x10 3/mm (ref 0.0–0.7)
Eosinophil %: 0.5 %
HCT: 30.1 % — ABNORMAL LOW (ref 35.0–47.0)
Lymphocyte %: 8.8 %
MCH: 30.2 pg (ref 26.0–34.0)
MCHC: 33.9 g/dL (ref 32.0–36.0)
MCV: 89 fL (ref 80–100)
Monocyte %: 5.1 %
Neutrophil #: 10.7 x10 3/mm — ABNORMAL HIGH (ref 1.4–6.5)
Neutrophil %: 84.6 %
RDW: 14.4 % (ref 11.5–14.5)
WBC: 12.6 x10 3/mm — ABNORMAL HIGH (ref 3.6–11.0)

## 2013-08-28 LAB — CBC CANCER CENTER
Basophil #: 0 x10 3/mm (ref 0.0–0.1)
Eosinophil #: 0.2 x10 3/mm (ref 0.0–0.7)
HCT: 33.2 % — ABNORMAL LOW (ref 35.0–47.0)
Lymphocyte #: 0.6 x10 3/mm — ABNORMAL LOW (ref 1.0–3.6)
Lymphocyte %: 15.5 %
Monocyte #: 0.3 x10 3/mm (ref 0.2–0.9)
Monocyte %: 7.8 %
Neutrophil %: 71.9 %
Platelet: 268 x10 3/mm (ref 150–440)
RDW: 14.6 % — ABNORMAL HIGH (ref 11.5–14.5)
WBC: 3.9 x10 3/mm (ref 3.6–11.0)

## 2013-09-05 ENCOUNTER — Ambulatory Visit: Payer: Self-pay | Admitting: Oncology

## 2013-09-05 LAB — CBC CANCER CENTER
HGB: 11.3 g/dL — ABNORMAL LOW (ref 12.0–16.0)
Lymphocyte %: 23.6 %
MCHC: 33.8 g/dL (ref 32.0–36.0)
MCV: 92 fL (ref 80–100)
Monocyte #: 0.3 x10 3/mm (ref 0.2–0.9)
Monocyte %: 8.3 %
Neutrophil #: 2.4 x10 3/mm (ref 1.4–6.5)
RDW: 14.9 % — ABNORMAL HIGH (ref 11.5–14.5)
WBC: 3.8 x10 3/mm (ref 3.6–11.0)

## 2013-09-05 LAB — COMPREHENSIVE METABOLIC PANEL
Albumin: 3.5 g/dL (ref 3.4–5.0)
Alkaline Phosphatase: 121 U/L (ref 50–136)
Anion Gap: 12 (ref 7–16)
BUN: 16 mg/dL (ref 7–18)
Calcium, Total: 8.5 mg/dL (ref 8.5–10.1)
Creatinine: 0.93 mg/dL (ref 0.60–1.30)
EGFR (African American): >60
EGFR (Non-African Amer.): >60
Glucose: 145 mg/dL — ABNORMAL HIGH (ref 65–99)
Osmolality: 283 (ref 275–301)
SGOT(AST): 21 U/L (ref 15–37)
Total Protein: 6.5 g/dL (ref 6.4–8.2)

## 2013-09-19 LAB — CBC CANCER CENTER
Basophil #: 0 x10 3/mm (ref 0.0–0.1)
Basophil %: 0.6 %
Eosinophil %: 1.3 %
HGB: 10.2 g/dL — ABNORMAL LOW (ref 12.0–16.0)
Lymphocyte #: 0.7 x10 3/mm — ABNORMAL LOW (ref 1.0–3.6)
Lymphocyte %: 10.4 %
MCH: 31.5 pg (ref 26.0–34.0)
MCV: 91 fL (ref 80–100)
Monocyte #: 0.4 x10 3/mm (ref 0.2–0.9)
Monocyte %: 5.6 %
Neutrophil %: 82.1 %
Platelet: 170 x10 3/mm (ref 150–440)
RBC: 3.24 10*6/uL — ABNORMAL LOW (ref 3.80–5.20)
RDW: 14.1 % (ref 11.5–14.5)
WBC: 6.9 x10 3/mm (ref 3.6–11.0)

## 2013-10-04 LAB — COMPREHENSIVE METABOLIC PANEL
Alkaline Phosphatase: 113 U/L (ref 50–136)
Anion Gap: 10 (ref 7–16)
Bilirubin,Total: 0.2 mg/dL (ref 0.2–1.0)
EGFR (African American): >60
EGFR (Non-African Amer.): >60
Glucose: 110 mg/dL — ABNORMAL HIGH (ref 65–99)
Osmolality: 280 (ref 275–301)
Potassium: 4.1 mmol/L (ref 3.5–5.1)
Sodium: 139 mmol/L (ref 136–145)

## 2013-10-04 LAB — CBC CANCER CENTER
Eosinophil %: 3 %
HCT: 32.6 % — ABNORMAL LOW (ref 35.0–47.0)
Lymphocyte #: 0.7 x10 3/mm — ABNORMAL LOW (ref 1.0–3.6)
MCH: 31 pg (ref 26.0–34.0)
MCHC: 33.5 g/dL (ref 32.0–36.0)
MCV: 93 fL (ref 80–100)
Neutrophil #: 3.4 x10 3/mm (ref 1.4–6.5)
Platelet: 247 x10 3/mm (ref 150–440)
RDW: 14.2 % (ref 11.5–14.5)
WBC: 4.6 x10 3/mm (ref 3.6–11.0)

## 2013-10-06 ENCOUNTER — Ambulatory Visit: Payer: Self-pay | Admitting: Oncology

## 2013-10-11 LAB — COMPREHENSIVE METABOLIC PANEL
Albumin: 3.5 g/dL (ref 3.4–5.0)
Anion Gap: 12 (ref 7–16)
BUN: 19 mg/dL — ABNORMAL HIGH (ref 7–18)
Calcium, Total: 8.7 mg/dL (ref 8.5–10.1)
Chloride: 101 mmol/L (ref 98–107)
Co2: 24 mmol/L (ref 21–32)
Creatinine: 1.06 mg/dL (ref 0.60–1.30)
EGFR (Non-African Amer.): 55 — ABNORMAL LOW
Glucose: 121 mg/dL — ABNORMAL HIGH (ref 65–99)
Osmolality: 277 (ref 275–301)
Potassium: 3.9 mmol/L (ref 3.5–5.1)
SGPT (ALT): 22 U/L (ref 12–78)
Sodium: 137 mmol/L (ref 136–145)
Total Protein: 6.5 g/dL (ref 6.4–8.2)

## 2013-10-11 LAB — CBC CANCER CENTER
Eosinophil #: 0 x10 3/mm (ref 0.0–0.7)
Lymphocyte %: 10.9 %
MCH: 31 pg (ref 26.0–34.0)
MCHC: 34 g/dL (ref 32.0–36.0)
MCV: 91 fL (ref 80–100)
Platelet: 130 x10 3/mm — ABNORMAL LOW (ref 150–440)
RBC: 3.62 10*6/uL — ABNORMAL LOW (ref 3.80–5.20)
RDW: 13.6 % (ref 11.5–14.5)
WBC: 5.7 x10 3/mm (ref 3.6–11.0)

## 2013-10-11 LAB — MAGNESIUM: Magnesium: 1.5 mg/dL — ABNORMAL LOW

## 2013-10-18 LAB — CBC CANCER CENTER
HCT: 27.8 % — ABNORMAL LOW (ref 35.0–47.0)
Lymphocyte %: 12.1 %
MCH: 31 pg (ref 26.0–34.0)
MCHC: 34.1 g/dL (ref 32.0–36.0)
MCV: 91 fL (ref 80–100)
Monocyte %: 5.7 %
Neutrophil #: 6.7 x10 3/mm — ABNORMAL HIGH (ref 1.4–6.5)
Platelet: 170 x10 3/mm (ref 150–440)
RBC: 3.06 10*6/uL — ABNORMAL LOW (ref 3.80–5.20)
WBC: 8.3 x10 3/mm (ref 3.6–11.0)

## 2013-11-05 ENCOUNTER — Ambulatory Visit: Payer: Self-pay | Admitting: Oncology

## 2013-11-07 LAB — COMPREHENSIVE METABOLIC PANEL
Anion Gap: 5 — ABNORMAL LOW (ref 7–16)
BUN: 13 mg/dL (ref 7–18)
Bilirubin,Total: 0.3 mg/dL (ref 0.2–1.0)
Calcium, Total: 8.9 mg/dL (ref 8.5–10.1)
Chloride: 102 mmol/L (ref 98–107)
Co2: 30 mmol/L (ref 21–32)
EGFR (African American): >60
EGFR (Non-African Amer.): >60
Glucose: 110 mg/dL — ABNORMAL HIGH (ref 65–99)
Potassium: 4.1 mmol/L (ref 3.5–5.1)
SGOT(AST): 16 U/L (ref 15–37)
Sodium: 137 mmol/L (ref 136–145)
Total Protein: 6.7 g/dL (ref 6.4–8.2)

## 2013-11-07 LAB — CBC CANCER CENTER
Basophil #: 0 x10 3/mm (ref 0.0–0.1)
Eosinophil #: 0.1 x10 3/mm (ref 0.0–0.7)
Eosinophil %: 1.7 %
HCT: 31.6 % — ABNORMAL LOW (ref 35.0–47.0)
Lymphocyte #: 0.7 x10 3/mm — ABNORMAL LOW (ref 1.0–3.6)
Lymphocyte %: 10.3 %
MCH: 31.1 pg (ref 26.0–34.0)
MCV: 93 fL (ref 80–100)
Monocyte #: 0.3 x10 3/mm (ref 0.2–0.9)
Neutrophil %: 82.4 %
WBC: 6.6 x10 3/mm (ref 3.6–11.0)

## 2013-12-06 ENCOUNTER — Ambulatory Visit: Payer: Self-pay | Admitting: Oncology

## 2013-12-17 ENCOUNTER — Ambulatory Visit: Payer: Self-pay | Admitting: Family Medicine

## 2013-12-18 ENCOUNTER — Ambulatory Visit: Payer: Self-pay | Admitting: Oncology

## 2013-12-25 ENCOUNTER — Ambulatory Visit: Payer: Self-pay | Admitting: Family Medicine

## 2013-12-31 LAB — COMPREHENSIVE METABOLIC PANEL
ALBUMIN: 3.5 g/dL (ref 3.4–5.0)
ANION GAP: 9 (ref 7–16)
AST: 17 U/L (ref 15–37)
Alkaline Phosphatase: 79 U/L
BILIRUBIN TOTAL: 0.2 mg/dL (ref 0.2–1.0)
BUN: 16 mg/dL (ref 7–18)
CREATININE: 0.97 mg/dL (ref 0.60–1.30)
Calcium, Total: 8.4 mg/dL — ABNORMAL LOW (ref 8.5–10.1)
Chloride: 100 mmol/L (ref 98–107)
Co2: 27 mmol/L (ref 21–32)
EGFR (African American): >60
EGFR (Non-African Amer.): >60
Glucose: 96 mg/dL (ref 65–99)
Osmolality: 273 (ref 275–301)
POTASSIUM: 3.8 mmol/L (ref 3.5–5.1)
SGPT (ALT): 16 U/L (ref 12–78)
Sodium: 136 mmol/L (ref 136–145)
TOTAL PROTEIN: 6.6 g/dL (ref 6.4–8.2)

## 2013-12-31 LAB — CBC CANCER CENTER
Basophil #: 0 x10 3/mm (ref 0.0–0.1)
Basophil %: 0.7 %
Eosinophil #: 0.1 x10 3/mm (ref 0.0–0.7)
Eosinophil %: 1.1 %
HCT: 32.6 % — ABNORMAL LOW (ref 35.0–47.0)
HGB: 10.8 g/dL — AB (ref 12.0–16.0)
Lymphocyte #: 1 x10 3/mm (ref 1.0–3.6)
Lymphocyte %: 17.1 %
MCH: 29.4 pg (ref 26.0–34.0)
MCHC: 32.9 g/dL (ref 32.0–36.0)
MCV: 89 fL (ref 80–100)
MONO ABS: 0.3 x10 3/mm (ref 0.2–0.9)
MONOS PCT: 5.9 %
NEUTROS ABS: 4.2 x10 3/mm (ref 1.4–6.5)
Neutrophil %: 75.2 %
Platelet: 268 x10 3/mm (ref 150–440)
RBC: 3.66 10*6/uL — AB (ref 3.80–5.20)
RDW: 12.7 % (ref 11.5–14.5)
WBC: 5.6 x10 3/mm (ref 3.6–11.0)

## 2014-01-01 LAB — CA 125: CA 125: 14.2 U/mL (ref 0.0–34.0)

## 2014-01-06 ENCOUNTER — Ambulatory Visit: Payer: Self-pay | Admitting: Oncology

## 2014-01-28 LAB — COMPREHENSIVE METABOLIC PANEL
ANION GAP: 10 (ref 7–16)
Albumin: 3.1 g/dL — ABNORMAL LOW (ref 3.4–5.0)
Alkaline Phosphatase: 127 U/L — ABNORMAL HIGH
BILIRUBIN TOTAL: 0.1 mg/dL — AB (ref 0.2–1.0)
BUN: 18 mg/dL (ref 7–18)
CHLORIDE: 104 mmol/L (ref 98–107)
CREATININE: 1.06 mg/dL (ref 0.60–1.30)
Calcium, Total: 8.6 mg/dL (ref 8.5–10.1)
Co2: 26 mmol/L (ref 21–32)
GFR CALC NON AF AMER: 55 — AB
Glucose: 94 mg/dL (ref 65–99)
Osmolality: 281 (ref 275–301)
Potassium: 4.1 mmol/L (ref 3.5–5.1)
SGOT(AST): 26 U/L (ref 15–37)
SGPT (ALT): 51 U/L (ref 12–78)
Sodium: 140 mmol/L (ref 136–145)
Total Protein: 6.5 g/dL (ref 6.4–8.2)

## 2014-01-28 LAB — CBC CANCER CENTER
BASOS PCT: 0.5 %
Basophil #: 0 x10 3/mm (ref 0.0–0.1)
Eosinophil #: 0.2 x10 3/mm (ref 0.0–0.7)
Eosinophil %: 3.2 %
HCT: 31.4 % — AB (ref 35.0–47.0)
HGB: 10.2 g/dL — AB (ref 12.0–16.0)
Lymphocyte #: 0.8 x10 3/mm — ABNORMAL LOW (ref 1.0–3.6)
Lymphocyte %: 16.5 %
MCH: 28.8 pg (ref 26.0–34.0)
MCHC: 32.4 g/dL (ref 32.0–36.0)
MCV: 89 fL (ref 80–100)
MONO ABS: 0.4 x10 3/mm (ref 0.2–0.9)
Monocyte %: 7.9 %
Neutrophil #: 3.6 x10 3/mm (ref 1.4–6.5)
Neutrophil %: 71.9 %
PLATELETS: 265 x10 3/mm (ref 150–440)
RBC: 3.54 10*6/uL — ABNORMAL LOW (ref 3.80–5.20)
RDW: 13.5 % (ref 11.5–14.5)
WBC: 5 x10 3/mm (ref 3.6–11.0)

## 2014-01-29 LAB — CA 125: CA 125: 18.3 U/mL (ref 0.0–34.0)

## 2014-02-03 ENCOUNTER — Ambulatory Visit: Payer: Self-pay | Admitting: Oncology

## 2014-03-06 ENCOUNTER — Ambulatory Visit: Payer: Self-pay | Admitting: Oncology

## 2014-04-05 ENCOUNTER — Ambulatory Visit: Payer: Self-pay | Admitting: Oncology

## 2014-05-06 ENCOUNTER — Ambulatory Visit: Payer: Self-pay | Admitting: Oncology

## 2014-05-15 ENCOUNTER — Ambulatory Visit: Payer: Self-pay | Admitting: Oncology

## 2014-05-15 LAB — COMPREHENSIVE METABOLIC PANEL
ALBUMIN: 3.4 g/dL (ref 3.4–5.0)
ALK PHOS: 110 U/L
ALT: 21 U/L (ref 12–78)
ANION GAP: 6 — AB (ref 7–16)
BUN: 12 mg/dL (ref 7–18)
Bilirubin,Total: 0.2 mg/dL (ref 0.2–1.0)
CALCIUM: 9.2 mg/dL (ref 8.5–10.1)
CO2: 26 mmol/L (ref 21–32)
Chloride: 102 mmol/L (ref 98–107)
Creatinine: 0.8 mg/dL (ref 0.60–1.30)
Glucose: 89 mg/dL (ref 65–99)
Osmolality: 267 (ref 275–301)
POTASSIUM: 4 mmol/L (ref 3.5–5.1)
SGOT(AST): 35 U/L (ref 15–37)
SODIUM: 134 mmol/L — AB (ref 136–145)
Total Protein: 7 g/dL (ref 6.4–8.2)

## 2014-05-15 LAB — CBC CANCER CENTER
Basophil #: 0 x10 3/mm (ref 0.0–0.1)
Basophil %: 0.5 %
EOS ABS: 0.2 x10 3/mm (ref 0.0–0.7)
Eosinophil %: 3.4 %
HCT: 30.6 % — ABNORMAL LOW (ref 35.0–47.0)
HGB: 10.3 g/dL — ABNORMAL LOW (ref 12.0–16.0)
LYMPHS ABS: 0.8 x10 3/mm — AB (ref 1.0–3.6)
LYMPHS PCT: 12.8 %
MCH: 29 pg (ref 26.0–34.0)
MCHC: 33.7 g/dL (ref 32.0–36.0)
MCV: 86 fL (ref 80–100)
MONOS PCT: 7.6 %
Monocyte #: 0.5 x10 3/mm (ref 0.2–0.9)
NEUTROS PCT: 75.7 %
Neutrophil #: 4.7 x10 3/mm (ref 1.4–6.5)
Platelet: 276 x10 3/mm (ref 150–440)
RBC: 3.55 10*6/uL — ABNORMAL LOW (ref 3.80–5.20)
RDW: 13.1 % (ref 11.5–14.5)
WBC: 6.2 x10 3/mm (ref 3.6–11.0)

## 2014-05-16 LAB — CA 125: CA 125: 17.2 U/mL (ref 0.0–34.0)

## 2014-05-20 DIAGNOSIS — F329 Major depressive disorder, single episode, unspecified: Secondary | ICD-10-CM | POA: Insufficient documentation

## 2014-05-20 DIAGNOSIS — D649 Anemia, unspecified: Secondary | ICD-10-CM | POA: Insufficient documentation

## 2014-05-20 DIAGNOSIS — C55 Malignant neoplasm of uterus, part unspecified: Secondary | ICD-10-CM | POA: Insufficient documentation

## 2014-05-20 DIAGNOSIS — F32A Depression, unspecified: Secondary | ICD-10-CM | POA: Insufficient documentation

## 2014-05-20 DIAGNOSIS — F3341 Major depressive disorder, recurrent, in partial remission: Secondary | ICD-10-CM | POA: Insufficient documentation

## 2014-06-05 ENCOUNTER — Ambulatory Visit: Payer: Self-pay | Admitting: Oncology

## 2014-07-06 ENCOUNTER — Ambulatory Visit: Payer: Self-pay | Admitting: Oncology

## 2014-07-23 LAB — CBC CANCER CENTER
BASOS ABS: 0.1 x10 3/mm (ref 0.0–0.1)
BASOS PCT: 1 %
Eosinophil #: 0.1 x10 3/mm (ref 0.0–0.7)
Eosinophil %: 2.1 %
HCT: 32.7 % — AB (ref 35.0–47.0)
HGB: 10.6 g/dL — ABNORMAL LOW (ref 12.0–16.0)
Lymphocyte #: 1 x10 3/mm (ref 1.0–3.6)
Lymphocyte %: 17.9 %
MCH: 27.1 pg (ref 26.0–34.0)
MCHC: 32.3 g/dL (ref 32.0–36.0)
MCV: 84 fL (ref 80–100)
MONO ABS: 0.5 x10 3/mm (ref 0.2–0.9)
Monocyte %: 8.8 %
NEUTROS PCT: 70.2 %
Neutrophil #: 3.9 x10 3/mm (ref 1.4–6.5)
Platelet: 345 x10 3/mm (ref 150–440)
RBC: 3.9 10*6/uL (ref 3.80–5.20)
RDW: 14.7 % — ABNORMAL HIGH (ref 11.5–14.5)
WBC: 5.6 x10 3/mm (ref 3.6–11.0)

## 2014-07-23 LAB — COMPREHENSIVE METABOLIC PANEL
ALBUMIN: 3.3 g/dL — AB (ref 3.4–5.0)
ALT: 20 U/L
Alkaline Phosphatase: 115 U/L
Anion Gap: 10 (ref 7–16)
BUN: 16 mg/dL (ref 7–18)
Bilirubin,Total: 0.2 mg/dL (ref 0.2–1.0)
CO2: 26 mmol/L (ref 21–32)
CREATININE: 0.99 mg/dL (ref 0.60–1.30)
Calcium, Total: 9.2 mg/dL (ref 8.5–10.1)
Chloride: 102 mmol/L (ref 98–107)
EGFR (Non-African Amer.): 60 — ABNORMAL LOW
Glucose: 81 mg/dL (ref 65–99)
OSMOLALITY: 276 (ref 275–301)
Potassium: 3.8 mmol/L (ref 3.5–5.1)
SGOT(AST): 25 U/L (ref 15–37)
Sodium: 138 mmol/L (ref 136–145)
Total Protein: 7.4 g/dL (ref 6.4–8.2)

## 2014-07-24 LAB — CA 125: CA 125: 18.8 U/mL (ref 0.0–34.0)

## 2014-07-31 ENCOUNTER — Ambulatory Visit: Payer: Self-pay | Admitting: Oncology

## 2014-08-01 LAB — COMPREHENSIVE METABOLIC PANEL
ALBUMIN: 3 g/dL — AB (ref 3.4–5.0)
ANION GAP: 9 (ref 7–16)
AST: 14 U/L — AB (ref 15–37)
Alkaline Phosphatase: 100 U/L
BUN: 14 mg/dL (ref 7–18)
Bilirubin,Total: 0.2 mg/dL (ref 0.2–1.0)
CALCIUM: 8.5 mg/dL (ref 8.5–10.1)
CHLORIDE: 99 mmol/L (ref 98–107)
CREATININE: 1.83 mg/dL — AB (ref 0.60–1.30)
Co2: 29 mmol/L (ref 21–32)
EGFR (Non-African Amer.): 28 — ABNORMAL LOW
GFR CALC AF AMER: 33 — AB
GLUCOSE: 95 mg/dL (ref 65–99)
OSMOLALITY: 274 (ref 275–301)
POTASSIUM: 3.7 mmol/L (ref 3.5–5.1)
SGPT (ALT): 16 U/L
SODIUM: 137 mmol/L (ref 136–145)
Total Protein: 6.6 g/dL (ref 6.4–8.2)

## 2014-08-01 LAB — CBC CANCER CENTER
Basophil #: 0 x10 3/mm (ref 0.0–0.1)
Basophil %: 0.8 %
EOS ABS: 0.1 x10 3/mm (ref 0.0–0.7)
EOS PCT: 1.3 %
HCT: 27.9 % — AB (ref 35.0–47.0)
HGB: 9.2 g/dL — ABNORMAL LOW (ref 12.0–16.0)
Lymphocyte #: 0.7 x10 3/mm — ABNORMAL LOW (ref 1.0–3.6)
Lymphocyte %: 13.3 %
MCH: 27.3 pg (ref 26.0–34.0)
MCHC: 32.8 g/dL (ref 32.0–36.0)
MCV: 83 fL (ref 80–100)
MONOS PCT: 7.4 %
Monocyte #: 0.4 x10 3/mm (ref 0.2–0.9)
NEUTROS ABS: 4.3 x10 3/mm (ref 1.4–6.5)
Neutrophil %: 77.2 %
Platelet: 280 x10 3/mm (ref 150–440)
RBC: 3.36 10*6/uL — ABNORMAL LOW (ref 3.80–5.20)
RDW: 14.5 % (ref 11.5–14.5)
WBC: 5.5 x10 3/mm (ref 3.6–11.0)

## 2014-08-01 LAB — MAGNESIUM: Magnesium: 1.7 mg/dL — ABNORMAL LOW

## 2014-08-06 ENCOUNTER — Ambulatory Visit: Payer: Self-pay | Admitting: Oncology

## 2014-08-26 LAB — CBC CANCER CENTER
Basophil #: 0 x10 3/mm (ref 0.0–0.1)
Basophil %: 0.6 %
EOS ABS: 0.1 x10 3/mm (ref 0.0–0.7)
Eosinophil %: 1.9 %
HCT: 32.2 % — AB (ref 35.0–47.0)
HGB: 10.6 g/dL — ABNORMAL LOW (ref 12.0–16.0)
LYMPHS ABS: 0.3 x10 3/mm — AB (ref 1.0–3.6)
Lymphocyte %: 12 %
MCH: 26.6 pg (ref 26.0–34.0)
MCHC: 32.9 g/dL (ref 32.0–36.0)
MCV: 81 fL (ref 80–100)
MONO ABS: 0.1 x10 3/mm — AB (ref 0.2–0.9)
MONOS PCT: 4.9 %
NEUTROS ABS: 2.2 x10 3/mm (ref 1.4–6.5)
NEUTROS PCT: 80.6 %
PLATELETS: 162 x10 3/mm (ref 150–440)
RBC: 3.99 10*6/uL (ref 3.80–5.20)
RDW: 14.3 % (ref 11.5–14.5)
WBC: 2.7 x10 3/mm — ABNORMAL LOW (ref 3.6–11.0)

## 2014-08-26 LAB — COMPREHENSIVE METABOLIC PANEL
ALBUMIN: 2.9 g/dL — AB (ref 3.4–5.0)
ALK PHOS: 112 U/L
ANION GAP: 10 (ref 7–16)
AST: 21 U/L (ref 15–37)
BUN: 11 mg/dL (ref 7–18)
Bilirubin,Total: 0.2 mg/dL (ref 0.2–1.0)
CALCIUM: 8.7 mg/dL (ref 8.5–10.1)
CREATININE: 0.98 mg/dL (ref 0.60–1.30)
Chloride: 99 mmol/L (ref 98–107)
Co2: 25 mmol/L (ref 21–32)
EGFR (African American): >60
EGFR (Non-African Amer.): >60
Glucose: 99 mg/dL (ref 65–99)
Osmolality: 268 (ref 275–301)
Potassium: 3.8 mmol/L (ref 3.5–5.1)
SGPT (ALT): 19 U/L
Sodium: 134 mmol/L — ABNORMAL LOW (ref 136–145)
Total Protein: 7 g/dL (ref 6.4–8.2)

## 2014-08-26 LAB — MAGNESIUM: MAGNESIUM: 1.9 mg/dL

## 2014-08-30 LAB — BASIC METABOLIC PANEL
Anion Gap: 9 (ref 7–16)
BUN: 6 mg/dL — AB (ref 7–18)
CALCIUM: 8.9 mg/dL (ref 8.5–10.1)
CO2: 29 mmol/L (ref 21–32)
Chloride: 96 mmol/L — ABNORMAL LOW (ref 98–107)
Creatinine: 0.88 mg/dL (ref 0.60–1.30)
EGFR (African American): >60
EGFR (Non-African Amer.): >60
Glucose: 88 mg/dL (ref 65–99)
Osmolality: 265 (ref 275–301)
Potassium: 3.3 mmol/L — ABNORMAL LOW (ref 3.5–5.1)
Sodium: 134 mmol/L — ABNORMAL LOW (ref 136–145)

## 2014-08-30 LAB — CBC CANCER CENTER
Basophil #: 0 x10 3/mm (ref 0.0–0.1)
Basophil %: 0.7 %
Eosinophil #: 0.1 x10 3/mm (ref 0.0–0.7)
Eosinophil %: 3.4 %
HCT: 30 % — AB (ref 35.0–47.0)
HGB: 9.7 g/dL — ABNORMAL LOW (ref 12.0–16.0)
Lymphocyte #: 0.8 x10 3/mm — ABNORMAL LOW (ref 1.0–3.6)
Lymphocyte %: 27.8 %
MCH: 25.9 pg — AB (ref 26.0–34.0)
MCHC: 32.3 g/dL (ref 32.0–36.0)
MCV: 80 fL (ref 80–100)
MONO ABS: 0.3 x10 3/mm (ref 0.2–0.9)
MONOS PCT: 12.3 %
NEUTROS ABS: 1.6 x10 3/mm (ref 1.4–6.5)
NEUTROS PCT: 55.8 %
Platelet: 163 x10 3/mm (ref 150–440)
RBC: 3.73 10*6/uL — ABNORMAL LOW (ref 3.80–5.20)
RDW: 14.1 % (ref 11.5–14.5)
WBC: 2.8 x10 3/mm — ABNORMAL LOW (ref 3.6–11.0)

## 2014-08-30 LAB — MAGNESIUM: Magnesium: 1.8 mg/dL

## 2014-09-05 ENCOUNTER — Ambulatory Visit: Payer: Self-pay | Admitting: Oncology

## 2014-09-05 LAB — COMPREHENSIVE METABOLIC PANEL
ALBUMIN: 2.9 g/dL — AB (ref 3.4–5.0)
AST: 23 U/L (ref 15–37)
Alkaline Phosphatase: 94 U/L
Anion Gap: 10 (ref 7–16)
BUN: 12 mg/dL (ref 7–18)
Bilirubin,Total: 0.2 mg/dL (ref 0.2–1.0)
Calcium, Total: 8.9 mg/dL (ref 8.5–10.1)
Chloride: 101 mmol/L (ref 98–107)
Co2: 27 mmol/L (ref 21–32)
Creatinine: 0.84 mg/dL (ref 0.60–1.30)
EGFR (African American): >60
Glucose: 131 mg/dL — ABNORMAL HIGH (ref 65–99)
OSMOLALITY: 277 (ref 275–301)
POTASSIUM: 3.6 mmol/L (ref 3.5–5.1)
SGPT (ALT): 21 U/L
SODIUM: 138 mmol/L (ref 136–145)
Total Protein: 6.4 g/dL (ref 6.4–8.2)

## 2014-09-05 LAB — CBC CANCER CENTER
Basophil #: 0 x10 3/mm (ref 0.0–0.1)
Basophil %: 0.4 %
EOS ABS: 0.1 x10 3/mm (ref 0.0–0.7)
Eosinophil %: 1.2 %
HCT: 27.7 % — ABNORMAL LOW (ref 35.0–47.0)
HGB: 9 g/dL — ABNORMAL LOW (ref 12.0–16.0)
Lymphocyte #: 0.7 x10 3/mm — ABNORMAL LOW (ref 1.0–3.6)
Lymphocyte %: 15.2 %
MCH: 26 pg (ref 26.0–34.0)
MCHC: 32.6 g/dL (ref 32.0–36.0)
MCV: 80 fL (ref 80–100)
Monocyte #: 0.5 x10 3/mm (ref 0.2–0.9)
Monocyte %: 10.3 %
Neutrophil #: 3.5 x10 3/mm (ref 1.4–6.5)
Neutrophil %: 72.9 %
Platelet: 302 x10 3/mm (ref 150–440)
RBC: 3.49 10*6/uL — AB (ref 3.80–5.20)
RDW: 14.2 % (ref 11.5–14.5)
WBC: 4.8 x10 3/mm (ref 3.6–11.0)

## 2014-09-12 LAB — CBC CANCER CENTER
Basophil #: 0.1 x10 3/mm (ref 0.0–0.1)
Basophil %: 1.4 %
Eosinophil #: 0.3 x10 3/mm (ref 0.0–0.7)
Eosinophil %: 4.9 %
HCT: 29.5 % — ABNORMAL LOW (ref 35.0–47.0)
HGB: 9.5 g/dL — ABNORMAL LOW (ref 12.0–16.0)
LYMPHS ABS: 0.9 x10 3/mm — AB (ref 1.0–3.6)
Lymphocyte %: 17.4 %
MCH: 25.9 pg — AB (ref 26.0–34.0)
MCHC: 32.3 g/dL (ref 32.0–36.0)
MCV: 80 fL (ref 80–100)
MONOS PCT: 5.7 %
Monocyte #: 0.3 x10 3/mm (ref 0.2–0.9)
Neutrophil #: 3.8 x10 3/mm (ref 1.4–6.5)
Neutrophil %: 70.6 %
Platelet: 182 x10 3/mm (ref 150–440)
RBC: 3.68 10*6/uL — AB (ref 3.80–5.20)
RDW: 14.9 % — AB (ref 11.5–14.5)
WBC: 5.3 x10 3/mm (ref 3.6–11.0)

## 2014-09-20 LAB — COMPREHENSIVE METABOLIC PANEL
ANION GAP: 9 (ref 7–16)
Albumin: 3.2 g/dL — ABNORMAL LOW (ref 3.4–5.0)
Alkaline Phosphatase: 138 U/L — ABNORMAL HIGH
BILIRUBIN TOTAL: 0.3 mg/dL (ref 0.2–1.0)
BUN: 11 mg/dL (ref 7–18)
CO2: 29 mmol/L (ref 21–32)
Calcium, Total: 9.3 mg/dL (ref 8.5–10.1)
Chloride: 99 mmol/L (ref 98–107)
Creatinine: 0.94 mg/dL (ref 0.60–1.30)
EGFR (African American): >60
GLUCOSE: 95 mg/dL (ref 65–99)
OSMOLALITY: 273 (ref 275–301)
Potassium: 3.9 mmol/L (ref 3.5–5.1)
SGOT(AST): 20 U/L (ref 15–37)
SGPT (ALT): 24 U/L
SODIUM: 137 mmol/L (ref 136–145)
Total Protein: 7.2 g/dL (ref 6.4–8.2)

## 2014-09-20 LAB — CBC CANCER CENTER
Basophil #: 0.1 x10 3/mm (ref 0.0–0.1)
Basophil %: 0.6 %
Eosinophil #: 0.1 x10 3/mm (ref 0.0–0.7)
Eosinophil %: 0.6 %
HCT: 28.4 % — ABNORMAL LOW (ref 35.0–47.0)
HGB: 9.3 g/dL — ABNORMAL LOW (ref 12.0–16.0)
Lymphocyte #: 0.9 x10 3/mm — ABNORMAL LOW (ref 1.0–3.6)
Lymphocyte %: 10.8 %
MCH: 26.4 pg (ref 26.0–34.0)
MCHC: 32.7 g/dL (ref 32.0–36.0)
MCV: 81 fL (ref 80–100)
Monocyte #: 0.6 x10 3/mm (ref 0.2–0.9)
Monocyte %: 7.6 %
Neutrophil #: 6.4 x10 3/mm (ref 1.4–6.5)
Neutrophil %: 80.4 %
Platelet: 284 x10 3/mm (ref 150–440)
RBC: 3.51 10*6/uL — ABNORMAL LOW (ref 3.80–5.20)
RDW: 15.8 % — ABNORMAL HIGH (ref 11.5–14.5)
WBC: 8 x10 3/mm (ref 3.6–11.0)

## 2014-09-26 LAB — CBC CANCER CENTER
BASOS ABS: 0 x10 3/mm (ref 0.0–0.1)
BASOS PCT: 0.5 %
Eosinophil #: 0 x10 3/mm (ref 0.0–0.7)
Eosinophil %: 0.5 %
HCT: 27.7 % — ABNORMAL LOW (ref 35.0–47.0)
HGB: 9 g/dL — ABNORMAL LOW (ref 12.0–16.0)
LYMPHS ABS: 0.7 x10 3/mm — AB (ref 1.0–3.6)
Lymphocyte %: 8.1 %
MCH: 26.7 pg (ref 26.0–34.0)
MCHC: 32.6 g/dL (ref 32.0–36.0)
MCV: 82 fL (ref 80–100)
MONO ABS: 0.6 x10 3/mm (ref 0.2–0.9)
Monocyte %: 7.5 %
Neutrophil #: 7 x10 3/mm — ABNORMAL HIGH (ref 1.4–6.5)
Neutrophil %: 83.4 %
Platelet: 337 x10 3/mm (ref 150–440)
RBC: 3.39 10*6/uL — AB (ref 3.80–5.20)
RDW: 16.7 % — ABNORMAL HIGH (ref 11.5–14.5)
WBC: 8.4 x10 3/mm (ref 3.6–11.0)

## 2014-10-03 LAB — CBC CANCER CENTER
Basophil #: 0 x10 3/mm (ref 0.0–0.1)
Basophil %: 0.6 %
EOS PCT: 1 %
Eosinophil #: 0.1 x10 3/mm (ref 0.0–0.7)
HCT: 27 % — ABNORMAL LOW (ref 35.0–47.0)
HGB: 8.7 g/dL — ABNORMAL LOW (ref 12.0–16.0)
Lymphocyte #: 0.8 x10 3/mm — ABNORMAL LOW (ref 1.0–3.6)
Lymphocyte %: 13.8 %
MCH: 26.2 pg (ref 26.0–34.0)
MCHC: 32.2 g/dL (ref 32.0–36.0)
MCV: 81 fL (ref 80–100)
MONO ABS: 0.6 x10 3/mm (ref 0.2–0.9)
MONOS PCT: 10.8 %
NEUTROS ABS: 4.2 x10 3/mm (ref 1.4–6.5)
Neutrophil %: 73.8 %
Platelet: 325 x10 3/mm (ref 150–440)
RBC: 3.32 10*6/uL — AB (ref 3.80–5.20)
RDW: 17.6 % — ABNORMAL HIGH (ref 11.5–14.5)
WBC: 5.7 x10 3/mm (ref 3.6–11.0)

## 2014-10-03 LAB — COMPREHENSIVE METABOLIC PANEL
ALT: 34 U/L
ANION GAP: 11 (ref 7–16)
Albumin: 3.1 g/dL — ABNORMAL LOW (ref 3.4–5.0)
Alkaline Phosphatase: 153 U/L — ABNORMAL HIGH
BILIRUBIN TOTAL: 0.3 mg/dL (ref 0.2–1.0)
BUN: 15 mg/dL (ref 7–18)
CALCIUM: 9 mg/dL (ref 8.5–10.1)
CHLORIDE: 100 mmol/L (ref 98–107)
CO2: 26 mmol/L (ref 21–32)
Creatinine: 0.84 mg/dL (ref 0.60–1.30)
EGFR (African American): >60
GLUCOSE: 87 mg/dL (ref 65–99)
Osmolality: 274 (ref 275–301)
Potassium: 3.7 mmol/L (ref 3.5–5.1)
SGOT(AST): 25 U/L (ref 15–37)
SODIUM: 137 mmol/L (ref 136–145)
Total Protein: 6.8 g/dL (ref 6.4–8.2)

## 2014-10-06 ENCOUNTER — Ambulatory Visit: Payer: Self-pay | Admitting: Oncology

## 2014-10-07 ENCOUNTER — Encounter: Payer: Self-pay | Admitting: General Surgery

## 2014-10-10 LAB — CBC CANCER CENTER
Basophil #: 0 x10 3/mm (ref 0.0–0.1)
Basophil %: 1.2 %
Eosinophil #: 0.2 x10 3/mm (ref 0.0–0.7)
Eosinophil %: 6.3 %
HCT: 27.1 % — ABNORMAL LOW (ref 35.0–47.0)
HGB: 8.7 g/dL — ABNORMAL LOW (ref 12.0–16.0)
Lymphocyte #: 0.6 x10 3/mm — ABNORMAL LOW (ref 1.0–3.6)
Lymphocyte %: 24.5 %
MCH: 26 pg (ref 26.0–34.0)
MCHC: 32 g/dL (ref 32.0–36.0)
MCV: 81 fL (ref 80–100)
Monocyte #: 0.1 x10 3/mm — ABNORMAL LOW (ref 0.2–0.9)
Monocyte %: 6 %
NEUTROS PCT: 62 %
Neutrophil #: 1.5 x10 3/mm (ref 1.4–6.5)
PLATELETS: 143 x10 3/mm — AB (ref 150–440)
RBC: 3.34 10*6/uL — ABNORMAL LOW (ref 3.80–5.20)
RDW: 17.5 % — ABNORMAL HIGH (ref 11.5–14.5)
WBC: 2.5 x10 3/mm — ABNORMAL LOW (ref 3.6–11.0)

## 2014-10-15 LAB — PATHOLOGY REPORT

## 2014-10-17 LAB — CBC CANCER CENTER
Basophil #: 0.1 x10 3/mm (ref 0.0–0.1)
Basophil %: 0.7 %
EOS ABS: 0 x10 3/mm (ref 0.0–0.7)
EOS PCT: 0.2 %
HCT: 28.3 % — AB (ref 35.0–47.0)
HGB: 9.2 g/dL — AB (ref 12.0–16.0)
Lymphocyte #: 1 x10 3/mm (ref 1.0–3.6)
Lymphocyte %: 10.2 %
MCH: 26 pg (ref 26.0–34.0)
MCHC: 32.5 g/dL (ref 32.0–36.0)
MCV: 80 fL (ref 80–100)
Monocyte #: 0.6 x10 3/mm (ref 0.2–0.9)
Monocyte %: 5.9 %
NEUTROS PCT: 83 %
Neutrophil #: 8.5 x10 3/mm — ABNORMAL HIGH (ref 1.4–6.5)
Platelet: 290 x10 3/mm (ref 150–440)
RBC: 3.53 10*6/uL — AB (ref 3.80–5.20)
RDW: 18.4 % — ABNORMAL HIGH (ref 11.5–14.5)
WBC: 10.3 x10 3/mm (ref 3.6–11.0)

## 2014-10-25 LAB — CBC CANCER CENTER
BASOS PCT: 0.9 %
Basophil #: 0.1 x10 3/mm (ref 0.0–0.1)
Eosinophil #: 0 x10 3/mm (ref 0.0–0.7)
Eosinophil %: 0.5 %
HCT: 26.8 % — ABNORMAL LOW (ref 35.0–47.0)
HGB: 8.7 g/dL — ABNORMAL LOW (ref 12.0–16.0)
Lymphocyte #: 0.7 x10 3/mm — ABNORMAL LOW (ref 1.0–3.6)
Lymphocyte %: 10.9 %
MCH: 26.7 pg (ref 26.0–34.0)
MCHC: 32.3 g/dL (ref 32.0–36.0)
MCV: 83 fL (ref 80–100)
Monocyte #: 0.6 x10 3/mm (ref 0.2–0.9)
Monocyte %: 10.4 %
Neutrophil #: 4.7 x10 3/mm (ref 1.4–6.5)
Neutrophil %: 77.3 %
PLATELETS: 391 x10 3/mm (ref 150–440)
RBC: 3.24 10*6/uL — ABNORMAL LOW (ref 3.80–5.20)
RDW: 19.5 % — AB (ref 11.5–14.5)
WBC: 6.1 x10 3/mm (ref 3.6–11.0)

## 2014-10-30 LAB — CBC CANCER CENTER
BASOS ABS: 0.1 x10 3/mm (ref 0.0–0.1)
Basophil %: 1.6 %
EOS ABS: 0.1 x10 3/mm (ref 0.0–0.7)
Eosinophil %: 1 %
HCT: 24.9 % — ABNORMAL LOW (ref 35.0–47.0)
HGB: 8.1 g/dL — ABNORMAL LOW (ref 12.0–16.0)
Lymphocyte #: 0.7 x10 3/mm — ABNORMAL LOW (ref 1.0–3.6)
Lymphocyte %: 14.4 %
MCH: 27 pg (ref 26.0–34.0)
MCHC: 32.6 g/dL (ref 32.0–36.0)
MCV: 83 fL (ref 80–100)
MONO ABS: 0.5 x10 3/mm (ref 0.2–0.9)
Monocyte %: 9.4 %
NEUTROS ABS: 3.8 x10 3/mm (ref 1.4–6.5)
Neutrophil %: 73.6 %
PLATELETS: 331 x10 3/mm (ref 150–440)
RBC: 3 10*6/uL — AB (ref 3.80–5.20)
RDW: 19.3 % — ABNORMAL HIGH (ref 11.5–14.5)
WBC: 5.2 x10 3/mm (ref 3.6–11.0)

## 2014-11-05 ENCOUNTER — Ambulatory Visit: Payer: Self-pay | Admitting: Oncology

## 2014-11-07 LAB — COMPREHENSIVE METABOLIC PANEL
ANION GAP: 10 (ref 7–16)
AST: 18 U/L (ref 15–37)
Albumin: 3.1 g/dL — ABNORMAL LOW (ref 3.4–5.0)
Alkaline Phosphatase: 128 U/L — ABNORMAL HIGH
BUN: 10 mg/dL (ref 7–18)
Bilirubin,Total: 0.2 mg/dL (ref 0.2–1.0)
CALCIUM: 9.4 mg/dL (ref 8.5–10.1)
CO2: 27 mmol/L (ref 21–32)
Chloride: 98 mmol/L (ref 98–107)
Creatinine: 1.02 mg/dL (ref 0.60–1.30)
EGFR (Non-African Amer.): 58 — ABNORMAL LOW
Glucose: 103 mg/dL — ABNORMAL HIGH (ref 65–99)
OSMOLALITY: 269 (ref 275–301)
POTASSIUM: 3.9 mmol/L (ref 3.5–5.1)
SGPT (ALT): 17 U/L
Sodium: 135 mmol/L — ABNORMAL LOW (ref 136–145)
Total Protein: 7.2 g/dL (ref 6.4–8.2)

## 2014-11-07 LAB — CBC CANCER CENTER
BASOS PCT: 0.8 %
Basophil #: 0.1 x10 3/mm (ref 0.0–0.1)
Eosinophil #: 0.1 x10 3/mm (ref 0.0–0.7)
Eosinophil %: 1.2 %
HCT: 27.3 % — ABNORMAL LOW (ref 35.0–47.0)
HGB: 8.9 g/dL — ABNORMAL LOW (ref 12.0–16.0)
LYMPHS ABS: 0.8 x10 3/mm — AB (ref 1.0–3.6)
Lymphocyte %: 11.1 %
MCH: 26.4 pg (ref 26.0–34.0)
MCHC: 32.5 g/dL (ref 32.0–36.0)
MCV: 81 fL (ref 80–100)
MONOS PCT: 8.1 %
Monocyte #: 0.6 x10 3/mm (ref 0.2–0.9)
NEUTROS ABS: 5.5 x10 3/mm (ref 1.4–6.5)
NEUTROS PCT: 78.8 %
Platelet: 368 x10 3/mm (ref 150–440)
RBC: 3.37 10*6/uL — ABNORMAL LOW (ref 3.80–5.20)
RDW: 18.7 % — AB (ref 11.5–14.5)
WBC: 7 x10 3/mm (ref 3.6–11.0)

## 2014-11-12 LAB — CBC CANCER CENTER
Basophil #: 0.1 x10 3/mm (ref 0.0–0.1)
Basophil %: 0.6 %
EOS ABS: 0.2 x10 3/mm (ref 0.0–0.7)
Eosinophil %: 2 %
HCT: 28.1 % — ABNORMAL LOW (ref 35.0–47.0)
HGB: 9.1 g/dL — ABNORMAL LOW (ref 12.0–16.0)
LYMPHS PCT: 8.4 %
Lymphocyte #: 0.7 x10 3/mm — ABNORMAL LOW (ref 1.0–3.6)
MCH: 26.3 pg (ref 26.0–34.0)
MCHC: 32.2 g/dL (ref 32.0–36.0)
MCV: 82 fL (ref 80–100)
Monocyte #: 0.6 x10 3/mm (ref 0.2–0.9)
Monocyte %: 7.1 %
Neutrophil #: 6.9 x10 3/mm — ABNORMAL HIGH (ref 1.4–6.5)
Neutrophil %: 81.9 %
Platelet: 390 x10 3/mm (ref 150–440)
RBC: 3.45 10*6/uL — ABNORMAL LOW (ref 3.80–5.20)
RDW: 18.1 % — ABNORMAL HIGH (ref 11.5–14.5)
WBC: 8.5 x10 3/mm (ref 3.6–11.0)

## 2014-11-12 LAB — COMPREHENSIVE METABOLIC PANEL
ALBUMIN: 3 g/dL — AB (ref 3.4–5.0)
AST: 15 U/L (ref 15–37)
Alkaline Phosphatase: 140 U/L — ABNORMAL HIGH
Anion Gap: 10 (ref 7–16)
BUN: 9 mg/dL (ref 7–18)
Bilirubin,Total: 0.3 mg/dL (ref 0.2–1.0)
CHLORIDE: 100 mmol/L (ref 98–107)
Calcium, Total: 9.1 mg/dL (ref 8.5–10.1)
Co2: 28 mmol/L (ref 21–32)
Creatinine: 1 mg/dL (ref 0.60–1.30)
EGFR (African American): >60
EGFR (Non-African Amer.): 59 — ABNORMAL LOW
Glucose: 97 mg/dL (ref 65–99)
OSMOLALITY: 274 (ref 275–301)
POTASSIUM: 3.8 mmol/L (ref 3.5–5.1)
SGPT (ALT): 15 U/L
SODIUM: 138 mmol/L (ref 136–145)
Total Protein: 7.2 g/dL (ref 6.4–8.2)

## 2014-11-18 LAB — CBC CANCER CENTER
BASOS PCT: 0.4 %
Basophil #: 0 x10 3/mm (ref 0.0–0.1)
EOS ABS: 0 x10 3/mm (ref 0.0–0.7)
Eosinophil %: 3.2 %
HCT: 27.4 % — ABNORMAL LOW (ref 35.0–47.0)
HGB: 8.8 g/dL — ABNORMAL LOW (ref 12.0–16.0)
Lymphocyte #: 0.2 x10 3/mm — ABNORMAL LOW (ref 1.0–3.6)
Lymphocyte %: 23.9 %
MCH: 26.5 pg (ref 26.0–34.0)
MCHC: 32.1 g/dL (ref 32.0–36.0)
MCV: 83 fL (ref 80–100)
MONO ABS: 0 x10 3/mm — AB (ref 0.2–0.9)
MONOS PCT: 3.5 %
NEUTROS PCT: 69 %
Neutrophil #: 0.6 x10 3/mm — ABNORMAL LOW (ref 1.4–6.5)
Platelet: 130 x10 3/mm — ABNORMAL LOW (ref 150–440)
RBC: 3.33 10*6/uL — ABNORMAL LOW (ref 3.80–5.20)
RDW: 17.2 % — ABNORMAL HIGH (ref 11.5–14.5)
WBC: 0.8 x10 3/mm — CL (ref 3.6–11.0)

## 2014-11-18 LAB — COMPREHENSIVE METABOLIC PANEL
Albumin: 2.8 g/dL — ABNORMAL LOW (ref 3.4–5.0)
Alkaline Phosphatase: 119 U/L — ABNORMAL HIGH
Anion Gap: 8 (ref 7–16)
BILIRUBIN TOTAL: 0.5 mg/dL (ref 0.2–1.0)
BUN: 22 mg/dL — AB (ref 7–18)
CALCIUM: 8.9 mg/dL (ref 8.5–10.1)
CO2: 29 mmol/L (ref 21–32)
CREATININE: 0.99 mg/dL (ref 0.60–1.30)
Chloride: 95 mmol/L — ABNORMAL LOW (ref 98–107)
EGFR (Non-African Amer.): 60 — ABNORMAL LOW
Glucose: 118 mg/dL — ABNORMAL HIGH (ref 65–99)
Osmolality: 269 (ref 275–301)
Potassium: 3.5 mmol/L (ref 3.5–5.1)
SGOT(AST): 11 U/L — ABNORMAL LOW (ref 15–37)
SGPT (ALT): 16 U/L
Sodium: 132 mmol/L — ABNORMAL LOW (ref 136–145)
TOTAL PROTEIN: 6.4 g/dL (ref 6.4–8.2)

## 2014-11-18 LAB — MAGNESIUM: Magnesium: 1.6 mg/dL — ABNORMAL LOW

## 2014-11-20 LAB — COMPREHENSIVE METABOLIC PANEL
ALT: 19 U/L
AST: 9 U/L — AB (ref 15–37)
Albumin: 2.8 g/dL — ABNORMAL LOW (ref 3.4–5.0)
Alkaline Phosphatase: 118 U/L — ABNORMAL HIGH
Anion Gap: 11 (ref 7–16)
BILIRUBIN TOTAL: 0.4 mg/dL (ref 0.2–1.0)
BUN: 18 mg/dL (ref 7–18)
CHLORIDE: 94 mmol/L — AB (ref 98–107)
Calcium, Total: 8.2 mg/dL — ABNORMAL LOW (ref 8.5–10.1)
Co2: 27 mmol/L (ref 21–32)
Creatinine: 0.97 mg/dL (ref 0.60–1.30)
EGFR (Non-African Amer.): >60
Glucose: 142 mg/dL — ABNORMAL HIGH (ref 65–99)
Osmolality: 269 (ref 275–301)
POTASSIUM: 3 mmol/L — AB (ref 3.5–5.1)
SODIUM: 132 mmol/L — AB (ref 136–145)
Total Protein: 6.7 g/dL (ref 6.4–8.2)

## 2014-11-20 LAB — CBC CANCER CENTER
BASOS ABS: 0 x10 3/mm (ref 0.0–0.1)
Basophil %: 0.8 %
Eosinophil #: 0 x10 3/mm (ref 0.0–0.7)
Eosinophil %: 3.2 %
HCT: 25.2 % — AB (ref 35.0–47.0)
HGB: 8.1 g/dL — ABNORMAL LOW (ref 12.0–16.0)
Lymphocyte #: 0.2 x10 3/mm — ABNORMAL LOW (ref 1.0–3.6)
Lymphocyte %: 70.8 %
MCH: 25.9 pg — ABNORMAL LOW (ref 26.0–34.0)
MCHC: 32.3 g/dL (ref 32.0–36.0)
MCV: 80 fL (ref 80–100)
MONO ABS: 0 x10 3/mm — AB (ref 0.2–0.9)
Monocyte %: 2.7 %
Neutrophil #: 0.1 x10 3/mm — ABNORMAL LOW (ref 1.4–6.5)
Neutrophil %: 22.5 %
Platelet: 52 x10 3/mm — ABNORMAL LOW (ref 150–440)
RBC: 3.13 10*6/uL — ABNORMAL LOW (ref 3.80–5.20)
RDW: 16.3 % — AB (ref 11.5–14.5)
WBC: 0.3 x10 3/mm — CL (ref 3.6–11.0)

## 2014-11-20 LAB — MAGNESIUM: MAGNESIUM: 2.2 mg/dL

## 2014-11-22 ENCOUNTER — Inpatient Hospital Stay: Payer: Self-pay | Admitting: Oncology

## 2014-11-22 LAB — COMPREHENSIVE METABOLIC PANEL
ALBUMIN: 2.4 g/dL — AB (ref 3.4–5.0)
Alkaline Phosphatase: 98 U/L
Anion Gap: 11 (ref 7–16)
BUN: 14 mg/dL (ref 7–18)
Bilirubin,Total: 0.6 mg/dL (ref 0.2–1.0)
CALCIUM: 8 mg/dL — AB (ref 8.5–10.1)
CO2: 27 mmol/L (ref 21–32)
Chloride: 94 mmol/L — ABNORMAL LOW (ref 98–107)
Creatinine: 0.88 mg/dL (ref 0.60–1.30)
EGFR (African American): >60
EGFR (Non-African Amer.): >60
Glucose: 86 mg/dL (ref 65–99)
Osmolality: 264 (ref 275–301)
Potassium: 2.6 mmol/L — ABNORMAL LOW (ref 3.5–5.1)
SGOT(AST): 9 U/L — ABNORMAL LOW (ref 15–37)
SGPT (ALT): 13 U/L — ABNORMAL LOW
Sodium: 132 mmol/L — ABNORMAL LOW (ref 136–145)
Total Protein: 5.6 g/dL — ABNORMAL LOW (ref 6.4–8.2)

## 2014-11-22 LAB — URINALYSIS, COMPLETE
BACTERIA: NONE SEEN
Bilirubin,UR: NEGATIVE
GLUCOSE, UR: NEGATIVE mg/dL (ref 0–75)
KETONE: NEGATIVE
LEUKOCYTE ESTERASE: NEGATIVE
NITRITE: NEGATIVE
PH: 6 (ref 4.5–8.0)
PROTEIN: NEGATIVE
RBC,UR: <1 /HPF (ref 0–5)
SPECIFIC GRAVITY: 1.004 (ref 1.003–1.030)
SQUAMOUS EPITHELIAL: NONE SEEN
WBC UR: <1 /HPF (ref 0–5)

## 2014-11-22 LAB — CBC CANCER CENTER
Basophil #: 0 x10 3/mm (ref 0.0–0.1)
Basophil %: 2.5 %
Eosinophil #: 0 x10 3/mm (ref 0.0–0.7)
Eosinophil %: 9.5 %
HCT: 19.8 % — ABNORMAL LOW (ref 35.0–47.0)
HGB: 6.5 g/dL — ABNORMAL LOW (ref 12.0–16.0)
LYMPHS PCT: 77.5 %
Lymphocyte #: 0.2 x10 3/mm — ABNORMAL LOW (ref 1.0–3.6)
MCH: 26.4 pg (ref 26.0–34.0)
MCHC: 33 g/dL (ref 32.0–36.0)
MCV: 80 fL (ref 80–100)
Monocyte #: 0 x10 3/mm — ABNORMAL LOW (ref 0.2–0.9)
Monocyte %: 8.1 %
NEUTROS ABS: 0 x10 3/mm — AB (ref 1.4–6.5)
NEUTROS PCT: 2.4 %
PLATELETS: 12 x10 3/mm — AB (ref 150–440)
RBC: 2.47 10*6/uL — AB (ref 3.80–5.20)
RDW: 15.9 % — ABNORMAL HIGH (ref 11.5–14.5)
WBC: 0.2 x10 3/mm — AB (ref 3.6–11.0)

## 2014-11-22 LAB — MAGNESIUM: MAGNESIUM: 1.9 mg/dL

## 2014-11-22 LAB — CLOSTRIDIUM DIFFICILE(ARMC)

## 2014-11-23 LAB — CBC WITH DIFFERENTIAL/PLATELET
BASOS ABS: 0 10*3/uL (ref 0.0–0.1)
Basophil %: 0.9 %
EOS ABS: 0 10*3/uL (ref 0.0–0.7)
EOS PCT: 8.7 %
HCT: 26.6 % — ABNORMAL LOW (ref 35.0–47.0)
HGB: 8.8 g/dL — ABNORMAL LOW (ref 12.0–16.0)
LYMPHS PCT: 71 %
Lymphocyte #: 0.4 10*3/uL — ABNORMAL LOW (ref 1.0–3.6)
MCH: 26.8 pg (ref 26.0–34.0)
MCHC: 33.1 g/dL (ref 32.0–36.0)
MCV: 81 fL (ref 80–100)
Monocyte #: 0.1 x10 3/mm — ABNORMAL LOW (ref 0.2–0.9)
Monocyte %: 13 %
Neutrophil #: 0 10*3/uL — ABNORMAL LOW (ref 1.4–6.5)
Neutrophil %: 6.4 %
Platelet: 32 10*3/uL — ABNORMAL LOW (ref 150–440)
RBC: 3.29 10*6/uL — ABNORMAL LOW (ref 3.80–5.20)
RDW: 16.3 % — AB (ref 11.5–14.5)
WBC: 0.5 10*3/uL — AB (ref 3.6–11.0)

## 2014-11-23 LAB — BASIC METABOLIC PANEL
Anion Gap: 8 (ref 7–16)
BUN: 10 mg/dL (ref 7–18)
CALCIUM: 7.5 mg/dL — AB (ref 8.5–10.1)
Chloride: 104 mmol/L (ref 98–107)
Co2: 26 mmol/L (ref 21–32)
Creatinine: 0.73 mg/dL (ref 0.60–1.30)
EGFR (African American): >60
EGFR (Non-African Amer.): >60
GLUCOSE: 72 mg/dL (ref 65–99)
Osmolality: 273 (ref 275–301)
Potassium: 3.6 mmol/L (ref 3.5–5.1)
SODIUM: 138 mmol/L (ref 136–145)

## 2014-11-24 LAB — BASIC METABOLIC PANEL
ANION GAP: 7 (ref 7–16)
BUN: 6 mg/dL — AB (ref 7–18)
CALCIUM: 7.7 mg/dL — AB (ref 8.5–10.1)
CHLORIDE: 104 mmol/L (ref 98–107)
Co2: 28 mmol/L (ref 21–32)
Creatinine: 0.69 mg/dL (ref 0.60–1.30)
EGFR (African American): >60
Glucose: 66 mg/dL (ref 65–99)
Osmolality: 273 (ref 275–301)
Potassium: 4 mmol/L (ref 3.5–5.1)
Sodium: 139 mmol/L (ref 136–145)

## 2014-11-24 LAB — CBC WITH DIFFERENTIAL/PLATELET
BASOS ABS: 0 10*3/uL (ref 0.0–0.1)
BASOS PCT: 0.8 %
EOS ABS: 0.1 10*3/uL (ref 0.0–0.7)
EOS PCT: 10.9 %
HCT: 25.8 % — ABNORMAL LOW (ref 35.0–47.0)
HGB: 8.5 g/dL — AB (ref 12.0–16.0)
LYMPHS PCT: 31.4 %
Lymphocyte #: 0.3 10*3/uL — ABNORMAL LOW (ref 1.0–3.6)
MCH: 26.6 pg (ref 26.0–34.0)
MCHC: 33.1 g/dL (ref 32.0–36.0)
MCV: 80 fL (ref 80–100)
MONO ABS: 0.1 x10 3/mm — AB (ref 0.2–0.9)
Monocyte %: 10.6 %
Neutrophil #: 0.5 10*3/uL — ABNORMAL LOW (ref 1.4–6.5)
Neutrophil %: 46.3 %
Platelet: 21 10*3/uL — CL (ref 150–440)
RBC: 3.21 10*6/uL — ABNORMAL LOW (ref 3.80–5.20)
RDW: 16.8 % — ABNORMAL HIGH (ref 11.5–14.5)
WBC: 1.1 10*3/uL — CL (ref 3.6–11.0)

## 2014-11-24 LAB — MAGNESIUM: Magnesium: 1.5 mg/dL — ABNORMAL LOW

## 2014-11-24 LAB — URINE CULTURE

## 2014-11-25 LAB — CBC WITH DIFFERENTIAL/PLATELET
Basophil #: 0 10*3/uL (ref 0.0–0.1)
Basophil %: 0.5 %
EOS PCT: 4.9 %
Eosinophil #: 0.2 10*3/uL (ref 0.0–0.7)
HCT: 26.2 % — ABNORMAL LOW (ref 35.0–47.0)
HGB: 8.7 g/dL — ABNORMAL LOW (ref 12.0–16.0)
Lymphocyte #: 0.5 10*3/uL — ABNORMAL LOW (ref 1.0–3.6)
Lymphocyte %: 12 %
MCH: 26.7 pg (ref 26.0–34.0)
MCHC: 33.1 g/dL (ref 32.0–36.0)
MCV: 81 fL (ref 80–100)
MONO ABS: 0.3 x10 3/mm (ref 0.2–0.9)
Monocyte %: 7.1 %
Neutrophil #: 2.9 10*3/uL (ref 1.4–6.5)
Neutrophil %: 75.5 %
Platelet: 17 10*3/uL — CL (ref 150–440)
RBC: 3.25 10*6/uL — AB (ref 3.80–5.20)
RDW: 16.6 % — AB (ref 11.5–14.5)
WBC: 3.8 10*3/uL (ref 3.6–11.0)

## 2014-11-25 LAB — BASIC METABOLIC PANEL
Anion Gap: 6 — ABNORMAL LOW (ref 7–16)
BUN: 4 mg/dL — AB (ref 7–18)
CHLORIDE: 104 mmol/L (ref 98–107)
Calcium, Total: 8.1 mg/dL — ABNORMAL LOW (ref 8.5–10.1)
Co2: 29 mmol/L (ref 21–32)
Creatinine: 0.73 mg/dL (ref 0.60–1.30)
EGFR (Non-African Amer.): >60
Glucose: 71 mg/dL (ref 65–99)
OSMOLALITY: 273 (ref 275–301)
Potassium: 4.1 mmol/L (ref 3.5–5.1)
SODIUM: 139 mmol/L (ref 136–145)

## 2014-11-25 LAB — MAGNESIUM: MAGNESIUM: 1.8 mg/dL

## 2014-11-26 LAB — COMPREHENSIVE METABOLIC PANEL
ALK PHOS: 109 U/L
ANION GAP: 6 — AB (ref 7–16)
Albumin: 2.1 g/dL — ABNORMAL LOW (ref 3.4–5.0)
BILIRUBIN TOTAL: 0.3 mg/dL (ref 0.2–1.0)
BUN: 2 mg/dL — ABNORMAL LOW (ref 7–18)
CO2: 28 mmol/L (ref 21–32)
Calcium, Total: 8 mg/dL — ABNORMAL LOW (ref 8.5–10.1)
Chloride: 106 mmol/L (ref 98–107)
Creatinine: 0.77 mg/dL (ref 0.60–1.30)
GLUCOSE: 79 mg/dL (ref 65–99)
OSMOLALITY: 275 (ref 275–301)
POTASSIUM: 4.2 mmol/L (ref 3.5–5.1)
SGOT(AST): 16 U/L (ref 15–37)
SGPT (ALT): 9 U/L — ABNORMAL LOW
SODIUM: 140 mmol/L (ref 136–145)
TOTAL PROTEIN: 4.8 g/dL — AB (ref 6.4–8.2)

## 2014-11-26 LAB — CBC WITH DIFFERENTIAL/PLATELET
BASOS PCT: 0.2 %
Basophil #: 0 10*3/uL (ref 0.0–0.1)
EOS ABS: 0.3 10*3/uL (ref 0.0–0.7)
EOS PCT: 2.8 %
HCT: 26.2 % — ABNORMAL LOW (ref 35.0–47.0)
HGB: 8.6 g/dL — ABNORMAL LOW (ref 12.0–16.0)
LYMPHS ABS: 0.7 10*3/uL — AB (ref 1.0–3.6)
Lymphocyte %: 7.3 %
MCH: 26.6 pg (ref 26.0–34.0)
MCHC: 32.7 g/dL (ref 32.0–36.0)
MCV: 81 fL (ref 80–100)
MONO ABS: 0.5 x10 3/mm (ref 0.2–0.9)
MONOS PCT: 5 %
NEUTROS ABS: 8.1 10*3/uL — AB (ref 1.4–6.5)
Neutrophil %: 84.7 %
PLATELETS: 17 10*3/uL — AB (ref 150–440)
RBC: 3.22 10*6/uL — AB (ref 3.80–5.20)
RDW: 16.5 % — AB (ref 11.5–14.5)
WBC: 9.6 10*3/uL (ref 3.6–11.0)

## 2014-11-27 LAB — CBC WITH DIFFERENTIAL/PLATELET
Basophil #: 0 10*3/uL (ref 0.0–0.1)
Basophil %: 0.2 %
EOS ABS: 0.2 10*3/uL (ref 0.0–0.7)
Eosinophil %: 1.5 %
HCT: 26 % — AB (ref 35.0–47.0)
HGB: 8.4 g/dL — AB (ref 12.0–16.0)
LYMPHS ABS: 1 10*3/uL (ref 1.0–3.6)
LYMPHS PCT: 6.7 %
MCH: 26.5 pg (ref 26.0–34.0)
MCHC: 32.4 g/dL (ref 32.0–36.0)
MCV: 82 fL (ref 80–100)
MONOS PCT: 6.7 %
Monocyte #: 1 x10 3/mm — ABNORMAL HIGH (ref 0.2–0.9)
NEUTROS ABS: 12.5 10*3/uL — AB (ref 1.4–6.5)
NEUTROS PCT: 84.9 %
Platelet: 23 10*3/uL — CL (ref 150–440)
RBC: 3.17 10*6/uL — ABNORMAL LOW (ref 3.80–5.20)
RDW: 16.7 % — AB (ref 11.5–14.5)
WBC: 14.7 10*3/uL — AB (ref 3.6–11.0)

## 2014-11-27 LAB — CULTURE, BLOOD (SINGLE)

## 2014-11-27 LAB — STOOL CULTURE

## 2014-12-03 LAB — CBC CANCER CENTER
BASOS ABS: 0.2 x10 3/mm — AB (ref 0.0–0.1)
BASOS PCT: 2.6 %
Eosinophil #: 0 x10 3/mm (ref 0.0–0.7)
Eosinophil %: 0.3 %
HCT: 26.8 % — ABNORMAL LOW (ref 35.0–47.0)
HGB: 8.7 g/dL — ABNORMAL LOW (ref 12.0–16.0)
LYMPHS PCT: 8.4 %
Lymphocyte #: 0.7 x10 3/mm — ABNORMAL LOW (ref 1.0–3.6)
MCH: 26.2 pg (ref 26.0–34.0)
MCHC: 32.4 g/dL (ref 32.0–36.0)
MCV: 81 fL (ref 80–100)
MONOS PCT: 9.2 %
Monocyte #: 0.7 x10 3/mm (ref 0.2–0.9)
Neutrophil #: 6.4 x10 3/mm (ref 1.4–6.5)
Neutrophil %: 79.5 %
Platelet: 352 x10 3/mm (ref 150–440)
RBC: 3.31 10*6/uL — AB (ref 3.80–5.20)
RDW: 16.8 % — ABNORMAL HIGH (ref 11.5–14.5)
WBC: 8 x10 3/mm (ref 3.6–11.0)

## 2014-12-06 ENCOUNTER — Ambulatory Visit: Payer: Self-pay | Admitting: Oncology

## 2014-12-10 LAB — COMPREHENSIVE METABOLIC PANEL
ALK PHOS: 109 U/L
ALT: 10 U/L — AB
AST: 12 U/L — AB (ref 15–37)
Albumin: 2.7 g/dL — ABNORMAL LOW (ref 3.4–5.0)
Anion Gap: 8 (ref 7–16)
BUN: 9 mg/dL (ref 7–18)
Bilirubin,Total: 0.3 mg/dL (ref 0.2–1.0)
CHLORIDE: 98 mmol/L (ref 98–107)
CO2: 29 mmol/L (ref 21–32)
Calcium, Total: 8.6 mg/dL (ref 8.5–10.1)
Creatinine: 0.8 mg/dL (ref 0.60–1.30)
EGFR (Non-African Amer.): >60
Glucose: 96 mg/dL (ref 65–99)
Osmolality: 269 (ref 275–301)
Potassium: 4 mmol/L (ref 3.5–5.1)
Sodium: 135 mmol/L — ABNORMAL LOW (ref 136–145)
Total Protein: 6.7 g/dL (ref 6.4–8.2)

## 2014-12-10 LAB — CBC CANCER CENTER
Basophil #: 0.1 x10 3/mm (ref 0.0–0.1)
Basophil %: 0.8 %
EOS ABS: 0 x10 3/mm (ref 0.0–0.7)
Eosinophil %: 0.3 %
HCT: 25.6 % — ABNORMAL LOW (ref 35.0–47.0)
HGB: 8.4 g/dL — ABNORMAL LOW (ref 12.0–16.0)
LYMPHS ABS: 0.7 x10 3/mm — AB (ref 1.0–3.6)
Lymphocyte %: 7.7 %
MCH: 27.1 pg (ref 26.0–34.0)
MCHC: 32.6 g/dL (ref 32.0–36.0)
MCV: 83 fL (ref 80–100)
Monocyte #: 1 x10 3/mm — ABNORMAL HIGH (ref 0.2–0.9)
Monocyte %: 11.5 %
NEUTROS PCT: 79.7 %
Neutrophil #: 7 x10 3/mm — ABNORMAL HIGH (ref 1.4–6.5)
PLATELETS: 427 x10 3/mm (ref 150–440)
RBC: 3.09 10*6/uL — ABNORMAL LOW (ref 3.80–5.20)
RDW: 16.9 % — ABNORMAL HIGH (ref 11.5–14.5)
WBC: 8.7 x10 3/mm (ref 3.6–11.0)

## 2014-12-10 LAB — MAGNESIUM: MAGNESIUM: 1.9 mg/dL

## 2014-12-16 LAB — COMPREHENSIVE METABOLIC PANEL
ANION GAP: 8 (ref 7–16)
Albumin: 2.6 g/dL — ABNORMAL LOW (ref 3.4–5.0)
Alkaline Phosphatase: 125 U/L — ABNORMAL HIGH
BILIRUBIN TOTAL: 0.3 mg/dL (ref 0.2–1.0)
BUN: 16 mg/dL (ref 7–18)
CHLORIDE: 99 mmol/L (ref 98–107)
CREATININE: 0.99 mg/dL (ref 0.60–1.30)
Calcium, Total: 8.4 mg/dL — ABNORMAL LOW (ref 8.5–10.1)
Co2: 28 mmol/L (ref 21–32)
GFR CALC NON AF AMER: 60 — AB
Glucose: 94 mg/dL (ref 65–99)
Osmolality: 271 (ref 275–301)
POTASSIUM: 3.2 mmol/L — AB (ref 3.5–5.1)
SGOT(AST): 10 U/L — ABNORMAL LOW (ref 15–37)
SGPT (ALT): 15 U/L
Sodium: 135 mmol/L — ABNORMAL LOW (ref 136–145)
TOTAL PROTEIN: 6.3 g/dL — AB (ref 6.4–8.2)

## 2014-12-16 LAB — CBC CANCER CENTER
BASOS PCT: 0.3 %
Basophil #: 0 x10 3/mm (ref 0.0–0.1)
Eosinophil #: 0.1 x10 3/mm (ref 0.0–0.7)
Eosinophil %: 1.6 %
HCT: 24.1 % — ABNORMAL LOW (ref 35.0–47.0)
HGB: 8.1 g/dL — AB (ref 12.0–16.0)
LYMPHS ABS: 0.6 x10 3/mm — AB (ref 1.0–3.6)
Lymphocyte %: 11.5 %
MCH: 26.9 pg (ref 26.0–34.0)
MCHC: 33.7 g/dL (ref 32.0–36.0)
MCV: 80 fL (ref 80–100)
MONOS PCT: 1.2 %
Monocyte #: 0.1 x10 3/mm — ABNORMAL LOW (ref 0.2–0.9)
NEUTROS ABS: 4.1 x10 3/mm (ref 1.4–6.5)
Neutrophil %: 85.4 %
Platelet: 108 x10 3/mm — ABNORMAL LOW (ref 150–440)
RBC: 3.01 10*6/uL — ABNORMAL LOW (ref 3.80–5.20)
RDW: 16.2 % — AB (ref 11.5–14.5)
WBC: 4.8 x10 3/mm (ref 3.6–11.0)

## 2014-12-16 LAB — MAGNESIUM: MAGNESIUM: 1.7 mg/dL — AB

## 2014-12-19 LAB — CBC CANCER CENTER
BASOS PCT: 0 %
Basophil #: 0 x10 3/mm (ref 0.0–0.1)
Eosinophil #: 0 x10 3/mm (ref 0.0–0.7)
Eosinophil %: 17.8 %
HCT: 22.7 % — AB (ref 35.0–47.0)
HGB: 7.4 g/dL — AB (ref 12.0–16.0)
LYMPHS ABS: 0.1 x10 3/mm — AB (ref 1.0–3.6)
Lymphocyte %: 69.6 %
MCH: 26.4 pg (ref 26.0–34.0)
MCHC: 32.7 g/dL (ref 32.0–36.0)
MCV: 81 fL (ref 80–100)
Monocyte #: 0 x10 3/mm — ABNORMAL LOW (ref 0.2–0.9)
Monocyte %: 4.7 %
NEUTROS ABS: 0 x10 3/mm — AB (ref 1.4–6.5)
Neutrophil %: 7.9 %
Platelet: 31 x10 3/mm — ABNORMAL LOW (ref 150–440)
RBC: 2.81 10*6/uL — ABNORMAL LOW (ref 3.80–5.20)
RDW: 16 % — AB (ref 11.5–14.5)
WBC: 0.1 x10 3/mm — CL (ref 3.6–11.0)

## 2014-12-19 LAB — BASIC METABOLIC PANEL
ANION GAP: 9 (ref 7–16)
BUN: 15 mg/dL (ref 7–18)
CHLORIDE: 96 mmol/L — AB (ref 98–107)
Calcium, Total: 8.1 mg/dL — ABNORMAL LOW (ref 8.5–10.1)
Co2: 27 mmol/L (ref 21–32)
Creatinine: 1.17 mg/dL (ref 0.60–1.30)
EGFR (African American): 60 — ABNORMAL LOW
EGFR (Non-African Amer.): 49 — ABNORMAL LOW
Glucose: 138 mg/dL — ABNORMAL HIGH (ref 65–99)
Osmolality: 268 (ref 275–301)
POTASSIUM: 3.4 mmol/L — AB (ref 3.5–5.1)
Sodium: 132 mmol/L — ABNORMAL LOW (ref 136–145)

## 2014-12-19 LAB — MAGNESIUM: MAGNESIUM: 1.4 mg/dL — AB

## 2014-12-20 LAB — CBC CANCER CENTER
Basophil #: 0 x10 3/mm (ref 0.0–0.1)
Basophil %: 0 %
EOS ABS: 0 x10 3/mm (ref 0.0–0.7)
Eosinophil %: 9.4 %
HCT: 24.7 % — ABNORMAL LOW (ref 35.0–47.0)
HGB: 8.2 g/dL — ABNORMAL LOW (ref 12.0–16.0)
LYMPHS ABS: 0.2 x10 3/mm — AB (ref 1.0–3.6)
Lymphocyte %: 79.4 %
MCH: 27.3 pg (ref 26.0–34.0)
MCHC: 33.3 g/dL (ref 32.0–36.0)
MCV: 82 fL (ref 80–100)
MONO ABS: 0 x10 3/mm — AB (ref 0.2–0.9)
Monocyte %: 8.8 %
NEUTROS PCT: 2.4 %
Neutrophil #: 0 x10 3/mm — ABNORMAL LOW (ref 1.4–6.5)
PLATELETS: 12 x10 3/mm — AB (ref 150–440)
RBC: 3.02 10*6/uL — AB (ref 3.80–5.20)
RDW: 15.6 % — ABNORMAL HIGH (ref 11.5–14.5)
WBC: 0.3 x10 3/mm — CL (ref 3.6–11.0)

## 2014-12-23 LAB — CBC CANCER CENTER
BASOS PCT: 0.3 %
Basophil #: 0 x10 3/mm (ref 0.0–0.1)
EOS ABS: 0.1 x10 3/mm (ref 0.0–0.7)
Eosinophil %: 1.6 %
HCT: 23.4 % — AB (ref 35.0–47.0)
HGB: 7.8 g/dL — ABNORMAL LOW (ref 12.0–16.0)
Lymphocyte #: 0.7 x10 3/mm — ABNORMAL LOW (ref 1.0–3.6)
Lymphocyte %: 10.9 %
MCH: 27.3 pg (ref 26.0–34.0)
MCHC: 33.2 g/dL (ref 32.0–36.0)
MCV: 82 fL (ref 80–100)
Monocyte #: 0.3 x10 3/mm (ref 0.2–0.9)
Monocyte %: 4.5 %
NEUTROS ABS: 5.3 x10 3/mm (ref 1.4–6.5)
Neutrophil %: 82.7 %
Platelet: 9 x10 3/mm — CL (ref 150–440)
RBC: 2.84 10*6/uL — AB (ref 3.80–5.20)
RDW: 15.6 % — ABNORMAL HIGH (ref 11.5–14.5)
WBC: 6.4 x10 3/mm (ref 3.6–11.0)

## 2014-12-23 LAB — BASIC METABOLIC PANEL
ANION GAP: 9 (ref 7–16)
BUN: 15 mg/dL (ref 7–18)
CHLORIDE: 100 mmol/L (ref 98–107)
Calcium, Total: 7.9 mg/dL — ABNORMAL LOW (ref 8.5–10.1)
Co2: 29 mmol/L (ref 21–32)
Creatinine: 0.95 mg/dL (ref 0.60–1.30)
EGFR (Non-African Amer.): >60
Glucose: 92 mg/dL (ref 65–99)
Osmolality: 276 (ref 275–301)
Potassium: 2.8 mmol/L — ABNORMAL LOW (ref 3.5–5.1)
Sodium: 138 mmol/L (ref 136–145)

## 2014-12-23 LAB — MAGNESIUM: Magnesium: 1.7 mg/dL — ABNORMAL LOW

## 2014-12-25 LAB — CBC CANCER CENTER
Basophil #: 0.1 x10 3/mm (ref 0.0–0.1)
Basophil %: 0.6 %
EOS PCT: 1.7 %
Eosinophil #: 0.2 x10 3/mm (ref 0.0–0.7)
HCT: 28 % — ABNORMAL LOW (ref 35.0–47.0)
HGB: 9.1 g/dL — ABNORMAL LOW (ref 12.0–16.0)
LYMPHS ABS: 1.1 x10 3/mm (ref 1.0–3.6)
Lymphocyte %: 9.3 %
MCH: 27.1 pg (ref 26.0–34.0)
MCHC: 32.5 g/dL (ref 32.0–36.0)
MCV: 83 fL (ref 80–100)
MONO ABS: 0.5 x10 3/mm (ref 0.2–0.9)
MONOS PCT: 4.4 %
NEUTROS ABS: 10.3 x10 3/mm — AB (ref 1.4–6.5)
Neutrophil %: 84 %
Platelet: 32 x10 3/mm — ABNORMAL LOW (ref 150–440)
RBC: 3.36 10*6/uL — ABNORMAL LOW (ref 3.80–5.20)
RDW: 16.9 % — ABNORMAL HIGH (ref 11.5–14.5)
WBC: 12.2 x10 3/mm — ABNORMAL HIGH (ref 3.6–11.0)

## 2014-12-30 LAB — CBC CANCER CENTER
BASOS PCT: 0.1 %
Basophil #: 0 x10 3/mm (ref 0.0–0.1)
EOS PCT: 0.1 %
Eosinophil #: 0 x10 3/mm (ref 0.0–0.7)
HCT: 24 % — AB (ref 35.0–47.0)
HGB: 7.8 g/dL — AB (ref 12.0–16.0)
LYMPHS ABS: 0.5 x10 3/mm — AB (ref 1.0–3.6)
Lymphocyte %: 3.3 %
MCH: 27.4 pg (ref 26.0–34.0)
MCHC: 32.4 g/dL (ref 32.0–36.0)
MCV: 85 fL (ref 80–100)
Monocyte #: 0.5 x10 3/mm (ref 0.2–0.9)
Monocyte %: 3.2 %
Neutrophil #: 14.6 x10 3/mm — ABNORMAL HIGH (ref 1.4–6.5)
Neutrophil %: 93.3 %
PLATELETS: 218 x10 3/mm (ref 150–440)
RBC: 2.84 10*6/uL — AB (ref 3.80–5.20)
RDW: 17.2 % — ABNORMAL HIGH (ref 11.5–14.5)
WBC: 15.7 x10 3/mm — ABNORMAL HIGH (ref 3.6–11.0)

## 2014-12-30 LAB — COMPREHENSIVE METABOLIC PANEL
ALBUMIN: 2.5 g/dL — AB (ref 3.4–5.0)
ALT: 14 U/L
Alkaline Phosphatase: 147 U/L — ABNORMAL HIGH
Anion Gap: 7 (ref 7–16)
BUN: 14 mg/dL (ref 7–18)
Bilirubin,Total: 0.3 mg/dL (ref 0.2–1.0)
CHLORIDE: 103 mmol/L (ref 98–107)
Calcium, Total: 8 mg/dL — ABNORMAL LOW (ref 8.5–10.1)
Co2: 29 mmol/L (ref 21–32)
Creatinine: 1.1 mg/dL (ref 0.60–1.30)
EGFR (Non-African Amer.): 53 — ABNORMAL LOW
Glucose: 176 mg/dL — ABNORMAL HIGH (ref 65–99)
Osmolality: 282 (ref 275–301)
Potassium: 4.1 mmol/L (ref 3.5–5.1)
SGOT(AST): 13 U/L — ABNORMAL LOW (ref 15–37)
Sodium: 139 mmol/L (ref 136–145)
TOTAL PROTEIN: 5.8 g/dL — AB (ref 6.4–8.2)

## 2014-12-30 LAB — MAGNESIUM: Magnesium: 1.7 mg/dL — ABNORMAL LOW

## 2015-01-06 ENCOUNTER — Ambulatory Visit: Payer: Self-pay | Admitting: Oncology

## 2015-01-06 LAB — CBC CANCER CENTER
BASOS PCT: 0.3 %
Basophil #: 0 x10 3/mm (ref 0.0–0.1)
EOS PCT: 0 %
Eosinophil #: 0 x10 3/mm (ref 0.0–0.7)
HCT: 24.1 % — AB (ref 35.0–47.0)
HGB: 7.9 g/dL — AB (ref 12.0–16.0)
LYMPHS PCT: 2.8 %
Lymphocyte #: 0.2 x10 3/mm — ABNORMAL LOW (ref 1.0–3.6)
MCH: 27.9 pg (ref 26.0–34.0)
MCHC: 32.7 g/dL (ref 32.0–36.0)
MCV: 85 fL (ref 80–100)
Monocyte #: 0.1 x10 3/mm — ABNORMAL LOW (ref 0.2–0.9)
Monocyte %: 1.5 %
NEUTROS ABS: 6.4 x10 3/mm (ref 1.4–6.5)
Neutrophil %: 95.4 %
Platelet: 182 x10 3/mm (ref 150–440)
RBC: 2.83 10*6/uL — AB (ref 3.80–5.20)
RDW: 17.8 % — ABNORMAL HIGH (ref 11.5–14.5)
WBC: 6.7 x10 3/mm (ref 3.6–11.0)

## 2015-01-08 LAB — CBC CANCER CENTER
BASOS PCT: 1.3 %
Basophil #: 0 x10 3/mm (ref 0.0–0.1)
Eosinophil #: 0 x10 3/mm (ref 0.0–0.7)
Eosinophil %: 1.2 %
HCT: 20.9 % — ABNORMAL LOW (ref 35.0–47.0)
HGB: 6.9 g/dL — ABNORMAL LOW (ref 12.0–16.0)
LYMPHS PCT: 38.2 %
Lymphocyte #: 0.4 x10 3/mm — ABNORMAL LOW (ref 1.0–3.6)
MCH: 28 pg (ref 26.0–34.0)
MCHC: 33 g/dL (ref 32.0–36.0)
MCV: 85 fL (ref 80–100)
Monocyte #: 0 x10 3/mm — ABNORMAL LOW (ref 0.2–0.9)
Monocyte %: 1.3 %
Neutrophil #: 0.6 x10 3/mm — ABNORMAL LOW (ref 1.4–6.5)
Neutrophil %: 58 %
Platelet: 81 x10 3/mm — ABNORMAL LOW (ref 150–440)
RBC: 2.47 10*6/uL — ABNORMAL LOW (ref 3.80–5.20)
RDW: 17.1 % — ABNORMAL HIGH (ref 11.5–14.5)
WBC: 1 x10 3/mm — CL (ref 3.6–11.0)

## 2015-01-08 LAB — BASIC METABOLIC PANEL
Anion Gap: 9 (ref 7–16)
BUN: 17 mg/dL (ref 7–18)
Calcium, Total: 8.3 mg/dL — ABNORMAL LOW (ref 8.5–10.1)
Chloride: 100 mmol/L (ref 98–107)
Co2: 28 mmol/L (ref 21–32)
Creatinine: 0.82 mg/dL (ref 0.60–1.30)
EGFR (African American): >60
GLUCOSE: 91 mg/dL (ref 65–99)
OSMOLALITY: 275 (ref 275–301)
POTASSIUM: 3.2 mmol/L — AB (ref 3.5–5.1)
Sodium: 137 mmol/L (ref 136–145)

## 2015-01-08 LAB — MAGNESIUM: MAGNESIUM: 1.6 mg/dL — AB

## 2015-01-13 LAB — COMPREHENSIVE METABOLIC PANEL
ALK PHOS: 156 U/L — AB (ref 46–116)
ANION GAP: 7 (ref 7–16)
Albumin: 2.7 g/dL — ABNORMAL LOW (ref 3.4–5.0)
BILIRUBIN TOTAL: 0.3 mg/dL (ref 0.2–1.0)
BUN: 20 mg/dL — ABNORMAL HIGH (ref 7–18)
CO2: 32 mmol/L (ref 21–32)
CREATININE: 0.97 mg/dL (ref 0.60–1.30)
Calcium, Total: 8.3 mg/dL — ABNORMAL LOW (ref 8.5–10.1)
Chloride: 101 mmol/L (ref 98–107)
EGFR (African American): >60
Glucose: 88 mg/dL (ref 65–99)
OSMOLALITY: 281 (ref 275–301)
Potassium: 3.3 mmol/L — ABNORMAL LOW (ref 3.5–5.1)
SGOT(AST): 14 U/L — ABNORMAL LOW (ref 15–37)
SGPT (ALT): 18 U/L (ref 14–63)
SODIUM: 140 mmol/L (ref 136–145)
Total Protein: 5.9 g/dL — ABNORMAL LOW (ref 6.4–8.2)

## 2015-01-13 LAB — CBC CANCER CENTER
Basophil #: 0 x10 3/mm (ref 0.0–0.1)
Basophil %: 0.3 %
EOS ABS: 0.1 x10 3/mm (ref 0.0–0.7)
Eosinophil %: 0.6 %
HCT: 27.1 % — AB (ref 35.0–47.0)
HGB: 9 g/dL — ABNORMAL LOW (ref 12.0–16.0)
Lymphocyte #: 1.1 x10 3/mm (ref 1.0–3.6)
Lymphocyte %: 7.3 %
MCH: 28 pg (ref 26.0–34.0)
MCHC: 33.2 g/dL (ref 32.0–36.0)
MCV: 84 fL (ref 80–100)
MONOS PCT: 5 %
Monocyte #: 0.7 x10 3/mm (ref 0.2–0.9)
NEUTROS ABS: 12 x10 3/mm — AB (ref 1.4–6.5)
NEUTROS PCT: 86.8 %
PLATELETS: 16 x10 3/mm — AB (ref 150–440)
RBC: 3.23 10*6/uL — ABNORMAL LOW (ref 3.80–5.20)
RDW: 16 % — AB (ref 11.5–14.5)
WBC: 13.9 x10 3/mm — AB (ref 3.6–11.0)

## 2015-02-04 ENCOUNTER — Ambulatory Visit
Admit: 2015-02-04 | Disposition: A | Discharge status: Home / Self Care | Payer: Self-pay | Attending: Oncology | Admitting: Oncology

## 2015-02-17 ENCOUNTER — Inpatient Hospital Stay: Payer: Self-pay | Admitting: Oncology

## 2015-03-04 LAB — CBC CANCER CENTER
BASOS ABS: 0 x10 3/mm (ref 0.0–0.1)
Basophil %: 0.4 %
EOS ABS: 0 x10 3/mm (ref 0.0–0.7)
Eosinophil %: 0.1 %
HCT: 24.8 % — ABNORMAL LOW (ref 35.0–47.0)
HGB: 8.3 g/dL — ABNORMAL LOW (ref 12.0–16.0)
LYMPHS ABS: 0.4 x10 3/mm — AB (ref 1.0–3.6)
Lymphocyte %: 6.5 %
MCH: 29.3 pg (ref 26.0–34.0)
MCHC: 33.4 g/dL (ref 32.0–36.0)
MCV: 88 fL (ref 80–100)
Monocyte #: 0.7 x10 3/mm (ref 0.2–0.9)
Monocyte %: 10.4 %
Neutrophil #: 5.5 x10 3/mm (ref 1.4–6.5)
Neutrophil %: 82.6 %
Platelet: 249 x10 3/mm (ref 150–440)
RBC: 2.83 10*6/uL — ABNORMAL LOW (ref 3.80–5.20)
RDW: 16.8 % — AB (ref 11.5–14.5)
WBC: 6.6 x10 3/mm (ref 3.6–11.0)

## 2015-03-04 LAB — COMPREHENSIVE METABOLIC PANEL
ALK PHOS: 113 U/L
ALT: 14 U/L
Albumin: 3.1 g/dL — ABNORMAL LOW
Anion Gap: 10 (ref 7–16)
BUN: 21 mg/dL — ABNORMAL HIGH
Bilirubin,Total: 0.7 mg/dL
CALCIUM: 9 mg/dL
CHLORIDE: 97 mmol/L — AB
Co2: 27 mmol/L
Creatinine: 0.91 mg/dL
EGFR (African American): >60
EGFR (Non-African Amer.): >60
GLUCOSE: 117 mg/dL — AB
Potassium: 3.6 mmol/L
SGOT(AST): 15 U/L
Sodium: 134 mmol/L — ABNORMAL LOW
Total Protein: 6.5 g/dL

## 2015-03-04 LAB — MAGNESIUM: MAGNESIUM: 1.7 mg/dL

## 2015-03-07 ENCOUNTER — Ambulatory Visit
Admit: 2015-03-07 | Disposition: A | Discharge status: Home / Self Care | Payer: Self-pay | Attending: Oncology | Admitting: Oncology

## 2015-03-12 LAB — CBC CANCER CENTER
BASOS ABS: 0 x10 3/mm (ref 0.0–0.1)
Basophil %: 0.1 %
EOS ABS: 0 x10 3/mm (ref 0.0–0.7)
Eosinophil %: 0 %
HCT: 25.9 % — ABNORMAL LOW (ref 35.0–47.0)
HGB: 8.6 g/dL — AB (ref 12.0–16.0)
Lymphocyte #: 0.2 x10 3/mm — ABNORMAL LOW (ref 1.0–3.6)
Lymphocyte %: 2.3 %
MCH: 29.5 pg (ref 26.0–34.0)
MCHC: 33.1 g/dL (ref 32.0–36.0)
MCV: 89 fL (ref 80–100)
MONO ABS: 0.3 x10 3/mm (ref 0.2–0.9)
Monocyte %: 3.3 %
NEUTROS PCT: 94.3 %
Neutrophil #: 8.9 x10 3/mm — ABNORMAL HIGH (ref 1.4–6.5)
Platelet: 286 x10 3/mm (ref 150–440)
RBC: 2.9 10*6/uL — ABNORMAL LOW (ref 3.80–5.20)
RDW: 17.3 % — AB (ref 11.5–14.5)
WBC: 9.5 x10 3/mm (ref 3.6–11.0)

## 2015-03-25 NOTE — Discharge Summary (Signed)
PATIENT NAME:  Graham, [name] MR#:  [id number] DATE OF BIRTH:  August 21, 1951  DATE OF ADMISSION:  06/08/2012 DATE OF DISCHARGE:  06/19/2012  DISCHARGE DIAGNOSES:  1. Radiation enterocolitis. 2. History of uterine cancer. 3. History of depression.  4. Hypertension that is new.  5. Urinary tract infection, resolved, status post treatment.  DISCHARGE MEDICATIONS:  1. Zoloft 50 mg p.o. daily.  2. Probiotic 1 capsule p.o. daily.  3. Zantac 150 mg p.o. twice a day. 4. Lisinopril 10 mg p.o. daily.  5. Pentasa extended release 1000 mg p.o. four times daily. 6. Questran 4 mg p.o. three times daily with meals.  7. Promethazine 25 mg p.o. p.r.n. for nausea and vomiting.  8. MiraLax 17 grams p.o. daily p.r.n. for constipation.   CONSULTANTS:  1. Verdie Shire, MD - Gastroenterology. 2. Dia Crawford, MD - Surgery.  PROCEDURES: The patient underwent a capsule study that will be interpreted by gastroenterology.   PERTINENT LABS: On 06/17/2012 the patient had sodium 145, potassium 3.6, and creatinine 0.79. White blood cell count 3.2, hemoglobin 11.5, and platelets 172.   BRIEF HOSPITAL COURSE:  1. Abdominal pain secondary to radiation enterocolitis. The patient initially came in complaining of abdominal pain with GI symptoms, nausea and vomiting with diarrhea. Initials abdominal films showed partial small bowel obstruction and also inflammation consistent with colitis secondary to possible radiation-induced colitis. The patient was evaluated by gastroenterology and also evaluated by surgery who did not think it was a surgical abdomen and deferred to gastroenterology. Gastroenterology evaluated the patient with a capsule study, which is still pending results. The patient was started on Questran and also started on Pentasa which did help. It appears that the patient's symptoms improves when she has an empty colon and worsens with solid foods. It is felt that this is likely going to be a recurrent issue for her  unless she has some type of surgical resection which is postponed for now. She will follow up with Dr. Candace Cruise as an outpatient and continue on a low residue diet and symptom control at this time.  2. Urinary tract infection. The patient was noted to have dysuria with pyuria. She was treated appropriately with Cipro.  3. Hypertension, new onset diagnosis. She was started on lisinopril and will continue on that regimen as an outpatient.   DISPOSITION: She is in stable condition to be discharged to home and followup with Dr. Candace Cruise in one week and followup with Dr. Netty Starring within 14 days of discharge.  ____________________________ [name], MD kl:slb D: 06/19/2012 16:36:48 ET T: 06/20/2012 12:43:41 ET JOB#: [id number]  cc: [name], MD, <Dictator> [name] MD ELECTRONICALLY SIGNED 07/06/2012 8:30

## 2015-03-26 LAB — CBC CANCER CENTER
BASOS ABS: 0 x10 3/mm (ref 0.0–0.1)
Basophil %: 0.3 %
EOS PCT: 0 %
Eosinophil #: 0 x10 3/mm (ref 0.0–0.7)
HCT: 25.7 % — ABNORMAL LOW (ref 35.0–47.0)
HGB: 8.3 g/dL — AB (ref 12.0–16.0)
LYMPHS ABS: 0.3 x10 3/mm — AB (ref 1.0–3.6)
LYMPHS PCT: 3 %
MCH: 28.8 pg (ref 26.0–34.0)
MCHC: 32.2 g/dL (ref 32.0–36.0)
MCV: 89 fL (ref 80–100)
MONO ABS: 0.3 x10 3/mm (ref 0.2–0.9)
MONOS PCT: 2.9 %
Neutrophil #: 9.6 x10 3/mm — ABNORMAL HIGH (ref 1.4–6.5)
Neutrophil %: 93.8 %
PLATELETS: 292 x10 3/mm (ref 150–440)
RBC: 2.88 10*6/uL — AB (ref 3.80–5.20)
RDW: 17 % — ABNORMAL HIGH (ref 11.5–14.5)
WBC: 10.2 x10 3/mm (ref 3.6–11.0)

## 2015-03-26 LAB — COMPREHENSIVE METABOLIC PANEL
Albumin: 3.2 g/dL — ABNORMAL LOW
Alkaline Phosphatase: 131 U/L — ABNORMAL HIGH
Anion Gap: 9 (ref 7–16)
BUN: 25 mg/dL — ABNORMAL HIGH
Bilirubin,Total: 0.4 mg/dL
CALCIUM: 9.1 mg/dL
CHLORIDE: 96 mmol/L — AB
CO2: 27 mmol/L
Creatinine: 1.06 mg/dL — ABNORMAL HIGH
EGFR (Non-African Amer.): 55 — ABNORMAL LOW
Glucose: 209 mg/dL — ABNORMAL HIGH
Potassium: 4.8 mmol/L
SGOT(AST): 15 U/L
SGPT (ALT): 17 U/L
SODIUM: 132 mmol/L — AB
Total Protein: 7.3 g/dL

## 2015-03-30 NOTE — Consult Note (Signed)
Chief Complaint:   Subjective/Chief Complaint Video capsule study in place. Started at [place]. Interestingly, when she get cleaned out with mg citrate, pain resolved.   VITAL SIGNS/ANCILLARY NOTES: **Vital Signs.:   11-Jul-13 04:55   Vital Signs Type Routine   Temperature Temperature (F) 97.8   Celsius 36.5   Temperature Source Oral   Pulse Pulse 59   Respirations Respirations 20   Systolic BP Systolic BP 563   Diastolic BP (mmHg) Diastolic BP (mmHg) 72   Mean BP 95   Pulse Ox % Pulse Ox % 96   Pulse Ox Activity Level  At rest   Oxygen Delivery Room Air/ 21 %   Brief Assessment:   Cardiac Regular    Respiratory clear BS    Gastrointestinal Normal   Lab Results: Routine Chem:  11-Jul-13 06:24    Glucose, Serum 78   BUN  6   Creatinine (comp) 0.74   Sodium, Serum 144   Potassium, Serum 4.0   Chloride, Serum 107   CO2, Serum 31   Calcium (Total), Serum 8.7   Anion Gap  6   Osmolality (calc) 283   eGFR (African American) >60   eGFR (Non-African American) >60 (eGFR values <32m/min/1.73 m2 may be an indication of chronic kidney disease (CKD). Calculated eGFR is useful in patients with stable renal function. The eGFR calculation will not be reliable in acutely ill patients when serum creatinine is changing rapidly. It is not useful in  patients on dialysis. The eGFR calculation may not be applicable to patients at the low and high extremes of body sizes, pregnant women, and vegetarians.)  Routine Hem:  11-Jul-13 06:24    WBC (CBC) 5.2   RBC (CBC)  3.72   Hemoglobin (CBC)  11.3   Hematocrit (CBC)  33.1   Platelet Count (CBC) 174   MCV 89   MCH 30.3   MCHC 34.1   RDW 13.3   Neutrophil % 77.1   Lymphocyte % 12.0   Monocyte % 6.9   Eosinophil % 3.7   Basophil % 0.3   Neutrophil # 4.0   Lymphocyte #  0.6   Monocyte # 0.4   Eosinophil # 0.2   Basophil # 0.0 (Result(s) reported on 15 Jun 2012 at 06:55AM.)   Assessment/Plan:  Assessment/Plan:   Assessment Abd  pain. Radiation enteritis?    Plan To start solids later today. If tolerated, ok for discharge tomorrow. Will try to read capsule study tomorrow once study finished and downloaded into computer.   Electronic Signatures: OVerdie Shire(MD)  (Signed 11-Jul-13 13:08)  Authored: Chief Complaint, VITAL SIGNS/ANCILLARY NOTES, Brief Assessment, Lab Results, Assessment/Plan   Last Updated: 11-Jul-13 13:08 by OVerdie Shire(MD)

## 2015-03-30 NOTE — Consult Note (Signed)
Chief Complaint:   Subjective/Chief Complaint Increasing abd pain with solids. Reviewed video capsule study. Diffuse telengiectasias in distal small intestine. Radiation changes? Capsule did not reach cecum.   VITAL SIGNS/ANCILLARY NOTES: **Vital Signs.:   12-Jul-13 04:27   Vital Signs Type Routine   Temperature Temperature (F) 97.8   Celsius 36.5   Temperature Source Oral   Pulse Pulse 65   Respirations Respirations 20   Systolic BP Systolic BP 585   Diastolic BP (mmHg) Diastolic BP (mmHg) 81   Mean BP 108   Pulse Ox % Pulse Ox % 96   Pulse Ox Activity Level  At rest   Oxygen Delivery Room Air/ 21 %   Brief Assessment:   Cardiac Regular    Respiratory clear BS    Gastrointestinal low abd tenderness   Lab Results: Routine Chem:  12-Jul-13 05:05    Result Comment labs - This specimen was collected through an   - indwelling catheter or arterial line.  - A minimum of 25ms of blood was wasted prior    - to collecting the sample.  Interpret  - results with caution.  Result(s) reported on 16 Jun 2012 at 05:25AM.   Glucose, Serum 88   BUN 8   Creatinine (comp) 0.76   Sodium, Serum 144   Potassium, Serum 3.8   Chloride, Serum  108   CO2, Serum 29   Calcium (Total), Serum 8.8   Anion Gap 7   Osmolality (calc) 285   eGFR (African American) >60   eGFR (Non-African American) >60 (eGFR values <628mmin/1.73 m2 may be an indication of chronic kidney disease (CKD). Calculated eGFR is useful in patients with stable renal function. The eGFR calculation will not be reliable in acutely ill patients when serum creatinine is changing rapidly. It is not useful in  patients on dialysis. The eGFR calculation may not be applicable to patients at the low and high extremes of body sizes, pregnant women, and vegetarians.)  Routine Hem:  12-Jul-13 05:05    WBC (CBC)  3.3   RBC (CBC)  3.64   Hemoglobin (CBC)  11.2   Hematocrit (CBC)  32.3   Platelet Count (CBC) 167   MCV 89   MCH  30.8   MCHC 34.7   RDW 13.1   Neutrophil % 65.8   Lymphocyte % 18.4   Monocyte % 8.6   Eosinophil % 5.3   Basophil % 1.9   Neutrophil # 2.1   Lymphocyte #  0.6   Monocyte # 0.3   Eosinophil # 0.2   Basophil # 0.1   Radiology Results: XRay:    09-Jul-13 12:35, Upper GI With Small Bowel   Upper GI With Small Bowel    REASON FOR EXAM:    hx of radiation enteritis, abd pain, diarrhea,   abnormal CT  COMMENTS:       PROCEDURE: FL  - FL UPPER GI WITH SMALL BOWEL  - Jun 13 2012 12:35PM     RESULT: Comparison: CT of the abdomen and pelvis 06/08/2012    Technique: Standard double contrast upper GI as well as small bowel   follow-through was performed with monitoring by intermittent fluoroscopy   and overhead images.    Findings:  The thoracic esophagus is normal in caliber. No intraluminal filling   defects or stricture identified. There was normal peristalsis of the     thoracic esophagus with swallowing. The gastroesophageal junction was   normal in caliber. No gastroesophageal reflux seen during  examination.   The stomach was normal in contour and caliber. No intraluminal filling   defects or ulceration identified. 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    Oral contrast material reached the a ascending colon by 2 hours. The   small bowel loops are normal in caliber. There are a few loops of small   bowel in the left lower quadrant which demonstrate evidence of bowel wall   thickening, with sharpening of the folds of the valvula conniventes. The   terminal ileum was unremarkable.    IMPRESSION:   1. There are a few loops of small bowel in the left hemipelvis which   demonstrate evidence of bowel wall thickening. This may be infectious,     inflammatory, or ischemic in etiology. Given the patient's history, these   findings couldbe in keeping with radiation enteritis.  2. No evidence of small bowel  obstruction.          Verified By: Gregor Hams, M.D., MD   Assessment/Plan:  Assessment/Plan:   Assessment Radiation enteritis? Abnormal SB series/video capsule study.    Plan Check KUB to make sure capsule passed. Trial of pentasa and see if sxs improve. Dr. Dionne Milo to see patient over the weekend. THanks.   Electronic Signatures: Verdie Shire (MD)  (Signed 12-Jul-13 13:37)  Authored: Chief Complaint, VITAL SIGNS/ANCILLARY NOTES, Brief Assessment, Lab Results, Radiology Results, Assessment/Plan   Last Updated: 12-Jul-13 13:37 by Verdie Shire (MD)

## 2015-03-30 NOTE — Consult Note (Signed)
PATIENT NAME:  [name] Graham, [name] Graham MR#:  [id number] DATE OF BIRTH:  10-31-1951  DATE OF CONSULTATION:  06/08/2012  REFERRING PHYSICIAN:   CONSULTING PHYSICIAN:  Elysse Polidore A. Marina Gravel, MD  REASON FOR CONSULTATION: Abdominal pain and possible partial small bowel obstruction.   HISTORY: This is a 64 year old white female with a history of gynecological malignancy who is status post total abdominal hysterectomy by Dr. Sabra Heck followed by chemoradiation therapy finishing up two months ago. Patient has had two months of abdominal pain which has gotten worse in the last several weeks. She has sought medical attention from multiple caregivers. A colonoscopy performed in February 2013 demonstrated no findings. Patient states that she has had daily abdominal pain with bouts of intermittent nausea and vomiting over the course of the last several weeks. Pain, nausea and vomiting became acutely worse. She was also having chronic nonbloody diarrhea and some dysuria. No hematuria is noted. No hematochezia. Patient is accompanied by her husband today. She brought herself to the Emergency Room for further evaluation and treatment. While in the Emergency Room, a CT scan was obtained with oral contrast which I have personally reviewed . It demonstrates findings consistent with potentially a small bowel obstruction with some small bowel wall thickening consistent with possible radiation enteritis. Patient states that she would like to have something more done to address her chronic abdominal pain. She seemed very frustrated with her lack of progress in this. A stool sample was obtained in the Emergency Room, C. difficile of which is negative. Patient denies any recent travel, no sick contacts. Accompanied by her husband.   ALLERGIES: Tape and penicillin.   HOME MEDICATIONS:  1. Cipro. 2. Percocet. 3. Sertraline. 4. Zantac.   PAST MEDICAL HISTORY: Uterine malignancy as described above.   PAST SURGICAL HISTORY: As described above.  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   REVIEW OF SYSTEMS: As described above.   PHYSICAL EXAMINATION:  GENERAL: Patient is alert and oriented, no obvious distress, accompanied by her husband.   VITAL SIGNS: Temperature 96.7, pulse 98, blood pressure 110/55.   LUNGS: Clear.   HEART: Regular rate and rhythm.   ABDOMEN: Soft, mildly distended. There is a well-healed lower midline incision. There is mild tenderness throughout. No peritoneal signs noted.   EXTREMITIES: Warm and well perfused.   RECTAL/GENITOURINARY: Deferred.   LABORATORY, DIAGNOSTIC, AND RADIOLOGICAL DATA: Urinalysis 2+ leukocyte esterase, 14 high-power field white blood cell count. Clostridium difficile assay is negative. White count 8.2, hemoglobin 13.8, platelet count 248,000. Electrolytes are unremarkable. Glucose 122. Review of CT scan is as described above.   IMPRESSION: Intermittent partial small bowel obstruction with nausea and vomiting and chronic abdominal pain suspicious for radiation enteritis. There is no surgical intervention existing at this present time.   RECOMMENDATIONS: Medical admission. Gastroenterology consultation. We will follow with you while in the hospital.   ____________________________ Jeannette How. Marina Gravel, MD mab:cms D: 06/08/2012 22:39:42 ET T: 06/09/2012 10:53:52 ET  JOB#: [id number] cc: Elta Guadeloupe A. Marina Gravel, MD, <Dictator> Weber Cooks, MD Martie Lee. Oliva Bustard, MD [name], MD Franke Menter Bettina Gavia MD ELECTRONICALLY SIGNED 06/09/2012 20:00

## 2015-03-30 NOTE — Consult Note (Signed)
Chief Complaint:   Subjective/Chief Complaint Feelos better. No diarrhea since using Questran.   VITAL SIGNS/ANCILLARY NOTES: **Vital Signs.:   14-Jul-13 05:47   Vital Signs Type Routine   Temperature Temperature (F) 98   Celsius 36.6   Temperature Source Oral   Pulse Pulse 61   Respirations Respirations 18   Systolic BP Systolic BP 583   Diastolic BP (mmHg) Diastolic BP (mmHg) 79   Mean BP 102   Pulse Ox % Pulse Ox % 96   Pulse Ox Activity Level  At rest   Oxygen Delivery Room Air/ 21 %   Brief Assessment:   Additional Physical Exam Abdomen is soft and benign.   Lab Results: Hepatic:  13-Jul-13 06:27    Bilirubin, Total 0.3   Alkaline Phosphatase 128   SGPT (ALT)  80 (12-78 NOTE: NEW REFERENCE RANGE 10/29/2011)   SGOT (AST)  97   Total Protein, Serum  6.2   Albumin, Serum  3.3  Routine Chem:  13-Jul-13 06:27    Glucose, Serum 81   BUN 7   Creatinine (comp) 0.79   Sodium, Serum 145   Potassium, Serum 3.6   Chloride, Serum  108   CO2, Serum 29   Calcium (Total), Serum 8.5   Osmolality (calc) 286   eGFR (African American) >60   eGFR (Non-African American) >60 (eGFR values <53m/min/1.73 m2 may be an indication of chronic kidney disease (CKD). Calculated eGFR is useful in patients with stable renal function. The eGFR calculation will not be reliable in acutely ill patients when serum creatinine is changing rapidly. It is not useful in  patients on dialysis. The eGFR calculation may not be applicable to patients at the low and high extremes of body sizes, pregnant women, and vegetarians.)   Anion Gap 8  Routine Hem:  13-Jul-13 06:27    WBC (CBC)  3.2   RBC (CBC)  3.72   Hemoglobin (CBC)  11.5   Hematocrit (CBC)  33.2   Platelet Count (CBC) 172   MCV 89   MCH 30.8   MCHC 34.6   RDW 13.6   Neutrophil % 63.1   Lymphocyte % 21.4   Monocyte % 10.3   Eosinophil % 4.4   Basophil % 0.8   Neutrophil # 2.0   Lymphocyte #  0.7   Monocyte # 0.3   Eosinophil  # 0.1   Basophil # 0.0 (Result(s) reported on 17 Jun 2012 at 07:08AM.)   Assessment/Plan:  Assessment/Plan:   Assessment Enteritis, ? radiation induced. Clinically the diarrhea is resolved.    Plan Continue Pentasa and Questran. Agree with repeating abdominal x -rays in am to confirm capsule migration. Dr. OCandace Cruisewill resume care in am.   Electronic Signatures: IJill Side(MD)  (Signed 14-Jul-13 13:43)  Authored: Chief Complaint, VITAL SIGNS/ANCILLARY NOTES, Brief Assessment, Lab Results, Assessment/Plan   Last Updated: 14-Jul-13 13:43 by IJill Side(MD)

## 2015-03-30 NOTE — Consult Note (Signed)
PATIENT NAME:  [name] Graham, [name] Graham MR#:  [id number] DATE OF BIRTH:  09-25-1951  DATE OF CONSULTATION:  06/08/2012  REFERRING PHYSICIAN:   CONSULTING PHYSICIAN:  Payton Emerald, NP  ADDENDUM:  PHYSICAL EXAMINATION:   VITAL SIGNS: Temperature 96.7 with a pulse of 98, blood pressure 110/50.   GENERAL: Well developed, well nourished 64 year old Caucasian female who appeared mildly ill. Friend at bedside.   HEENT: Normocephalic, atraumatic. Pupils equal, reactive to light. Conjunctivae clear. Sclerae anicteric.   NECK: Supple. Trachea midline. No lymphadenopathy or thyromegaly.   PULMONARY: Symmetric rise and fall of chest. Clear to auscultation throughout.   CARDIOVASCULAR: Regular rhythm, S1, S2. No murmurs. No gallops.   ABDOMEN: Soft. Hypoactive bowel sounds. Marked tenderness throughout entire abdomen. No bruits. No masses.   RECTAL: Deferred.   MUSCULOSKELETAL: Moving all four extremities. No contractures. No clubbing.   EXTREMITIES: No edema.   SKIN: Warm, dry. Color slightly pale. No lesions. No rashes.      NEUROLOGICAL: No gross neurological deficits.    ____________________________ Payton Emerald, NP dsh:drc D: 06/13/2012 12:20:00 ET T: 06/13/2012 12:58:28 ET JOB#: [id number]  cc: Payton Emerald, NP, <Dictator> Payton Emerald MD ELECTRONICALLY SIGNED 06/15/2012 18:28

## 2015-03-30 NOTE — Consult Note (Signed)
Chief Complaint:   Subjective/Chief Complaint Just returned from X-ray. Some nausea and low abd pain/cramping. Diet advanced to full liquids this AM. Passing gas.   VITAL SIGNS/ANCILLARY NOTES: **Vital Signs.:   06-Jul-13 04:35   Vital Signs Type Routine   Temperature Temperature (F) 98.1   Celsius 36.7   Temperature Source Oral   Pulse Pulse 65   Respirations Respirations 20   Systolic BP Systolic BP 680   Diastolic BP (mmHg) Diastolic BP (mmHg) 69   Mean BP 84   Pulse Ox % Pulse Ox % 96   Pulse Ox Activity Level  At rest   Oxygen Delivery Room Air/ 21 %   Brief Assessment:   Cardiac Regular    Respiratory clear BS    Gastrointestinal low abd tenderness   Lab Results: Routine Chem:  06-Jul-13 04:46    Glucose, Serum 87   BUN  5   Creatinine (comp) 0.69   Sodium, Serum 144   Potassium, Serum 3.6   Chloride, Serum  110   CO2, Serum 24   Calcium (Total), Serum  8.1   Anion Gap 10   Osmolality (calc) 283   eGFR (African American) >60   eGFR (Non-African American) >60 (eGFR values <77m/min/1.73 m2 may be an indication of chronic kidney disease (CKD). Calculated eGFR is useful in patients with stable renal function. The eGFR calculation will not be reliable in acutely ill patients when serum creatinine is changing rapidly. It is not useful in  patients on dialysis. The eGFR calculation may not be applicable to patients at the low and high extremes of body sizes, pregnant women, and vegetarians.)  Routine Hem:  06-Jul-13 04:46    WBC (CBC)  2.3   RBC (CBC)  3.45   Hemoglobin (CBC)  10.6   Hematocrit (CBC)  30.9   Platelet Count (CBC) 168   MCV 90   MCH 30.7   MCHC 34.3   RDW 13.3   Neutrophil % 56.6   Lymphocyte % 27.9   Monocyte % 9.8   Eosinophil % 4.8   Basophil % 0.9   Neutrophil #  1.3   Lymphocyte #  0.7   Monocyte # 0.2   Eosinophil # 0.1   Basophil # 0.0 (Result(s) reported on 10 Jun 2012 at 05:46AM.)   Radiology Results: XRay:    06-Jul-13  10:09, Abdomen Flat and Erect   Abdomen Flat and Erect    REASON FOR EXAM:    SBO  COMMENTS:       PROCEDURE: DXR - DXR ABDOMEN 2 V FLAT AND ERECT  - Jun 10 2012 10:09AM     RESULT: Comparisons:  None    Findings:      Supine and upright views of the abdomen are provided.    There is a nonspecific bowel gaspattern. There is no bowel dilatation to   suggest obstruction. There is contrast material within the colon from   recent CT of the abdomen. There are a few colonic air-fluid levels. There   is no pathologic calcification along the expected course ofthe ureters.     There is no evidence of pneumoperitoneum, portal venous gas, or   pneumatosis.    The osseous structures are unremarkable.    IMPRESSION:     No evidence of bowel obstruction.    Dictation Site: 1          Verified By: HJennette Banker M.D., MD   Assessment/Plan:  Assessment/Plan:   Assessment Radiation enteritis.  X-ray shows improvement.    Plan Continue to advance diet as tolerated. Serial x-rays pending.   Electronic Signatures: Verdie Shire (MD)  (Signed 06-Jul-13 10:44)  Authored: Chief Complaint, VITAL SIGNS/ANCILLARY NOTES, Brief Assessment, Lab Results, Radiology Results, Assessment/Plan   Last Updated: 06-Jul-13 10:44 by Verdie Shire (MD)

## 2015-03-30 NOTE — Consult Note (Signed)
PATIENT NAME:  [name] Graham, [name] Graham MR#:  [id number] DATE OF BIRTH:  [birth year]  DATE OF CONSULTATION:  06/09/2012  REFERRING PHYSICIAN:  Fritzi Mandes, MD CONSULTING PHYSICIAN:  Verdie Shire, MD / Payton Emerald, NP  REASON FOR CONSULTATION: Diarrhea and vomiting, possible radiation induced.   HISTORY OF PRESENT ILLNESS: Ms. [name] Graham is a 64 year old Caucasian female who presented to [facility] Emergency Room yesterday. She is known to our office. She recently had a colonoscopy which was done for the indication of screening, but there was no evidence of colitis or inflammation at that time. She had notified our office Monday of this week with the concern of continued diarrhea. Based on findings of CT scan of abdomen and pelvis, which was done on 05/15/2012, the recommendation was made for her to start on Questran one scoop p.o. twice a day as well as to use NuLev 0.125 mg 1 tablet p.o. sublingual every 4 to 6 hours as needed. CT scan of abdomen and pelvis on 05/15/2012 with contrast revealed the patient to be status post hysterectomy, numerous segments of small bowel and the supra vesicle region of the pelvis with diffuse wall thickening consistent with enteritis felt to be possibly in correlation with radiation therapy history. She does have a significant history of endometrial cancer status post chemotherapy as well as radiation therapy. She is followed at [facility] by Dr. Forest Gleason, Dr. Donella Stade, and Dr. Jacquelyne Balint. There was no evidence of pleural or pericardial effusion or ascites. The liver showed no masses or enlargement. The spleen enhanced homogenously and normal in size. A tiny sliding hiatal hernia was present. The aorta was normal in caliber. The kidneys enhanced normally. No gallstones. Pancreas within normal limits. The other portions of the colon and the small bowel appeared within normal limits. The urinary bladder was somewhat thickened, especially superiorly. There was no definite local  or regional metastatic disease being evident. No inguinal or pelvic mass or adenopathy being present.   When the patient notified our office on Monday of this week she was stating that she was continuing with abdominal pain mostly to the lower abdomen. Her number of bowel movements a day was 10 to 12. She states that she had already been to [facility] and was advised to follow-up with [place]. She was recommended to do the trial of Questran as well as NuLev and then to followup in our office. She does state that Tuesday she had taken two doses of the Questran and used NuLev as needed. The loose consistency to her stools had improved to by that evening her stool had become formed. Over the past 36 to 48 hours though she started experiencing pain epigastrically as well and states that she had become very clammy, felt like she was going to pass out, and vomited. She has had hiccups intermittently. No fevers or cold sweats. She has not vomited since being admitted to her room. Indigestion at times. She currently is on a clear liquid diet. Complaining of a terrible headache. She questions whether that is because she has not been able to have any caffeine. She did not eat at all yesterday. The sharp achy pain which she was experiencing prior to admission has resolved and she has had passage of flatulence in the last 24 hours.   A CT scan of the abdomen and pelvis was done 06/08/2012 with contrast which revealed that there was evidence of partial mid to distal small bowel obstruction, possibly related to adhesions, in the  anterior aspect of the lower abdomen and upper pelvis and small bowel transition zone was seen. There was a small amount of fluid in the mesenteric fat. 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 Loops of small bowel within the pelvis on yesterday's study. Did not exhibit a thickened wall, but a small amount of fluid in the pelvis. There was no acute abnormality to the colon. There was no  evidence of acute hepatobiliary abnormality nor acute urinary tract abnormality.   PAST MEDICAL HISTORY:  1. Uterine cancer. 2. History of depression, situational. 3. Enteritis in 2013.  PAST SURGICAL HISTORY:  1. Complete hysterectomy in 2012.  2. Right knee surgery in 2008. 3. Sinus surgery in 2005. 4. Port cath insertion in 2012. 5. Tonsillectomy.  6. Colonoscopy 02/03/2012, unremarkable.   MEDICATIONS:  1. Zoloft 50 mg once a day. 2. Promethazine 25 mg a day as directed as needed.  3. Amantadine 150 mg twice a day.  4. NuLev 0.125 mg 1 tablet p.o. sublingual as directed as needed.  5. Questran one scoop twice daily.   ALLERGIES: Penicillin.   FAMILY HISTORY: Mother with history of colon cancer diagnosed in her 48s. Brother with history of colonic polyps. No celiac disease or IBD.   SOCIAL HISTORY: No tobacco. One or two alcoholic drinks a week. Caffeine on a daily basis.   REVIEW OF SYSTEMS: CONSTITUTIONAL: Significant for chills. Denies any fevers. No weight loss. Significant for fatigue and weakness. HEENT: No blurred vision. No double vision. No glaucoma. No tinnitus. No ear pain or hearing loss. RESPIRATORY: No coughing, no wheezing, and no hemoptysis. No history of chronic obstructive pulmonary disease. CARDIOVASCULAR: No chest pain. No edema. No hypertension. GASTROINTESTINAL: See history of present illness. GENITOURINARY: Significant for dysuria and urinary frequency. Diagnosis of urinary tract infection on admission. ENDOCRINE: No polyuria or nocturia. HEMATOLOGIC: Denies anemia or significant easy bruising and bleeding. SKIN: No rashes. No lesions. MUSCULOSKELETAL: Significant for arthritis. NEUROLOGIC: No history of cerebrovascular accident or transient ischemic attack or seizure disorder. PSYCH: Significant for depression and anxiety.   LABORATORY, DIAGNOSTIC AND RADIOLOGIC DATA: Chemistry panel on admission: Glucose was elevated at 122, otherwise within normal limits.  Hepatic panel within normal limits. CBC within normal limits. C. difficile toxin A and B and PCR negative.   Urinalysis revealed +2 leukocytes, 1 per high-power field RBC,. WBC 14 per high-power field, bacteria trace, epithelial cells 2 per high-power field, hyaline casts 17 per low-power field, and granular casts are 83 per high-power field.   A CT scan of abdomen and pelvis with contrast as stated above.   IMPRESSION:  1. Abdominal pain with associated nausea and vomiting. 2. Diagnosis of enteritis, suspect radiation related with history of uterine cancer.  3. Abnormal CT scan of abdomen and pelvis with findings concerning for a partial mid to distal small bowel obstruction.   PLAN: The patient's presentation was discussed with Dr. Verdie Shire. The patient will be seen and evaluated by Dr. Candace Cruise prior to gastroenterology recommendations being made. If radiation is the cause of enteritis, then surgical management will be necessary. Further gastroenterology recommendations to follow once seen by Dr. Candace Cruise.     These services provided by Payton Emerald, MS, APRN, [facility], FNP under collaborative agreement with Dr. Verdie Shire. ____________________________ Payton Emerald, NP dsh:slb D: 06/09/2012 13:10:58 ET T: 06/09/2012 14:47:37 ET JOB#: [id number]  cc: Payton Emerald, NP, <Dictator> Payton Emerald MD ELECTRONICALLY SIGNED 06/09/2012 17:04

## 2015-03-30 NOTE — Consult Note (Signed)
Chief Complaint:   Subjective/Chief Complaint Continues with abdominal pain with associated diarrhea on Sunday.  Diet had been advanced to regular diet. When advanced she started to experience diarrhea again.  Six bowel movements on Sunday.  No bowel movement since yesterday.  Small amount.  Abdominal pain has increased.  Significnat for abdominal bloating.  Mild nausea.  Small amount of passage of flatulence.  Not feeling better today.  Scheduled for small bowel follow through today.  Tolerating liquids well.   VITAL SIGNS/ANCILLARY NOTES: **Vital Signs.:   09-Jul-13 04:43   Vital Signs Type Routine   Temperature Temperature (F) 98   Celsius 36.6   Temperature Source Oral   Pulse Pulse 58   Respirations Respirations 18   Systolic BP Systolic BP 151   Diastolic BP (mmHg) Diastolic BP (mmHg) 67   Mean BP 92   Pulse Ox % Pulse Ox % 97   Pulse Ox Activity Level  At rest   Oxygen Delivery Room Air/ 21 %   Brief Assessment:   Cardiac Regular  no murmur    Respiratory normal resp effort    Gastrointestinal details normal No masses palpable  No rebound tenderness  No gaurding  Abdomen soft.  Hypoactive bowel sounds.  Tenderness throughout abdomen.  More locaized to left lower qudrant.    Additional Physical Exam Alert and oriented x 4.  Color pink.  Skin warm and dry.  NAD noted.   Lab Results:  Routine Chem:  06-Jul-13 04:46    Chloride, Serum  110  08-Jul-13 05:48    Glucose, Serum 83   BUN  6   Creatinine (comp) 0.72   Sodium, Serum 145   Potassium, Serum 3.5   Chloride, Serum 106   CO2, Serum 28   Calcium (Total), Serum 8.8   Anion Gap 11   Osmolality (calc) 285   eGFR (African American) >60   eGFR (Non-African American) >60 (eGFR values <7m/min/1.73 m2 may be an indication of chronic kidney disease (CKD). Calculated eGFR is useful in patients with stable renal function. The eGFR calculation will not be reliable in acutely ill patients when serum creatinine is  changing rapidly. It is not useful in  patients on dialysis. The eGFR calculation may not be applicable to patients at the low and high extremes of body sizes, pregnant women, and vegetarians.)  Routine Hem:  08-Jul-13 05:48    WBC (CBC)  3.0   RBC (CBC)  3.77   Hemoglobin (CBC)  11.5   Hematocrit (CBC)  33.8   Platelet Count (CBC) 199   MCV 90   MCH 30.4   MCHC 33.9   RDW 13.2   Neutrophil % 61.6   Lymphocyte % 21.4   Monocyte % 10.2   Eosinophil % 6.0   Basophil % 0.8   Neutrophil # 1.9   Lymphocyte #  0.7   Monocyte # 0.3   Eosinophil # 0.2   Basophil # 0.0 (Result(s) reported on 12 Jun 2012 at 07:17AM.)   Radiology Results: XRay:    07-Jul-13 07:40, Abdomen Flat and Erect   Abdomen Flat and Erect    REASON FOR EXAM:    SBO  COMMENTS:       PROCEDURE: DXR - DXR ABDOMEN 2 V FLAT AND ERECT  - Jun 11 2012  7:40AM     RESULT: Comparisons: 06/10/2012    Findings:     Supine and upright views of the abdomen are provided.     There is  a nonspecific bowel gas pattern. There is no bowel dilatation to   suggest obstruction. There is contrast material within the colon from   recent CT of the abdomen. There are a few colonic air-fluid levels. There   is no pathologic calcification along the expected courseof the ureters.     There is no evidence of pneumoperitoneum, portal venous gas, or   pneumatosis.     The osseous structures are unremarkable.    IMPRESSION:      No evidence of bowel obstruction.     Dictation Site: 1          Verified By: Jennette Banker, M.D., MD   Assessment/Plan:  Assessment/Plan:   Assessment Admitted for small bowel obstruction.  History of diarrhea.  With advancement of diet caused diarrhea to exacerbate again.  Increase abdominal pain.    Plan 1.  Pending small bowel follow through results today.   2.  Depending on results.  Consideration of proceeding with capsule enteroscopy study tomorrow if abnormality is noted on SBFT.   3.  In  agreement for patient to resume clear liquid diet today.  Helpful in preparation if pill cam is necessary tomorrow.   4.  Will continue to monitor.   Plan was discussed with Dr. Verdie Shire.   Electronic Signatures: Payton Emerald (NP)  (Signed 09-Jul-13 10:17)  Authored: Chief Complaint, VITAL SIGNS/ANCILLARY NOTES, Brief Assessment, Lab Results, Radiology Results, Assessment/Plan   Last Updated: 09-Jul-13 10:17 by Payton Emerald (NP)

## 2015-03-30 NOTE — Consult Note (Signed)
Brief Consult Note: Diagnosis: Abdominal pain with nausea and vomiting.  Diagnosed with enteritis radiation related.  History of uterine cancer.  CT scan of abdomen and pelvis revealing evidence of partial mid to distal small bowel obstruction.   Consult note dictated.   Discussed with Attending MD.   Comments: Patient's presentation discussed with Dr. Verdie Shire.  Patient will be seen and evaluated by Dr. Verdie Shire prior to GI recommendations are made.  If radiation is the cause of enteritis then surgical management would be necessary.  Further GI recommendation will follow once seen by Dr. Candace Cruise.  Electronic Signatures: Payton Emerald (NP)  (Signed 05-Jul-13 13:11)  Authored: Brief Consult Note   Last Updated: 05-Jul-13 13:11 by Payton Emerald (NP)

## 2015-03-30 NOTE — Consult Note (Signed)
Chief Complaint:   Subjective/Chief Complaint As long as she takes liquids, she is ok. Chronic abd pain. No BM since yest after starting questran.   VITAL SIGNS/ANCILLARY NOTES: **Vital Signs.:   10-Jul-13 04:27   Vital Signs Type Routine   Temperature Temperature (F) 97.6   Celsius 36.4   Temperature Source Oral   Pulse Pulse 54   Respirations Respirations 18   Systolic BP Systolic BP 032   Diastolic BP (mmHg) Diastolic BP (mmHg) 65   Mean BP 79   Pulse Ox % Pulse Ox % 96   Pulse Ox Activity Level  At rest   Oxygen Delivery Room Air/ 21 %   Brief Assessment:   Cardiac Regular    Respiratory clear BS    Gastrointestinal low abd tenderness   Lab Results: Hepatic:  10-Jul-13 04:05    Bilirubin, Total 0.3   Alkaline Phosphatase 75   SGPT (ALT) 42 (12-78 NOTE: NEW REFERENCE RANGE 10/29/2011)   SGOT (AST)  40   Total Protein, Serum  5.7   Albumin, Serum  3.0  Routine Chem:  10-Jul-13 04:05    Glucose, Serum 78   BUN  5   Creatinine (comp) 0.89   Sodium, Serum 145   Potassium, Serum 3.8   Chloride, Serum  109   CO2, Serum 30   Calcium (Total), Serum  8.2   Osmolality (calc) 285   eGFR (African American) >60   eGFR (Non-African American) >60 (eGFR values <82m/min/1.73 m2 may be an indication of chronic kidney disease (CKD). Calculated eGFR is useful in patients with stable renal function. The eGFR calculation will not be reliable in acutely ill patients when serum creatinine is changing rapidly. It is not useful in  patients on dialysis. The eGFR calculation may not be applicable to patients at the low and high extremes of body sizes, pregnant women, and vegetarians.)   Result Comment labs - This specimen was collected through an   - indwelling catheter or arterial line.  - A minimum of 511m of blood was wasted prior    - to collecting the sample.  Interpret  - results with caution.  Result(s) reported on 14 Jun 2012 at 04:29AM.   Anion Gap  6   Lipase  60  (Result(s) reported on 14 Jun 2012 at 04:29AM.)  Routine Hem:  10-Jul-13 04:05    WBC (CBC)  2.7   RBC (CBC)  3.53   Hemoglobin (CBC)  10.8   Hematocrit (CBC)  31.3   Platelet Count (CBC) 171   MCV 89   MCH 30.6   MCHC 34.5   RDW 13.2   Neutrophil % 59.9   Lymphocyte % 22.0   Monocyte % 9.9   Eosinophil % 7.2   Basophil % 1.0   Neutrophil # 1.6   Lymphocyte #  0.6   Monocyte # 0.3   Eosinophil # 0.2   Basophil # 0.0 (Result(s) reported on 14 Jun 2012 at 04:15AM.)   Assessment/Plan:  Assessment/Plan:   Assessment Radiation enteritis?    Plan Discussed starting solids again or proceeding with video capsule 1st. After discussion, will plan capsule study in AM and then can resume solids tomorrow afternoon. Thanks   Electronic Signatures: OhVerdie ShireMD)  (Signed 10-Jul-13 13:23)  Authored: Chief Complaint, VITAL SIGNS/ANCILLARY NOTES, Brief Assessment, Lab Results, Assessment/Plan   Last Updated: 10-Jul-13 13:23 by OhVerdie ShireMD)

## 2015-03-30 NOTE — Consult Note (Signed)
Chief Complaint:   Subjective/Chief Complaint Still with diarrhea and rumbling. Tolerating diet. Stool C. diff was negative few days ago.   VITAL SIGNS/ANCILLARY NOTES: **Vital Signs.:   13-Jul-13 14:02   Vital Signs Type Routine   Temperature Temperature (F) 98   Celsius 36.6   Temperature Source oral   Pulse Pulse 62   Respirations Respirations 20   Systolic BP Systolic BP 798   Diastolic BP (mmHg) Diastolic BP (mmHg) 76   Mean BP 100   Pulse Ox % Pulse Ox % 96   Pulse Ox Activity Level  At rest   Oxygen Delivery Room Air/ 21 %   Brief Assessment:   Additional Physical Exam Abdomen is soft with some tenderness in mid abdominal area. No rebound.   Lab Results: Hepatic:  13-Jul-13 06:27    Bilirubin, Total 0.3   Alkaline Phosphatase 128   SGPT (ALT)  80 (12-78 NOTE: NEW REFERENCE RANGE 10/29/2011)   SGOT (AST)  97   Total Protein, Serum  6.2   Albumin, Serum  3.3  Routine Chem:  13-Jul-13 06:27    Glucose, Serum 81   BUN 7   Creatinine (comp) 0.79   Sodium, Serum 145   Potassium, Serum 3.6   Chloride, Serum  108   CO2, Serum 29   Calcium (Total), Serum 8.5   Osmolality (calc) 286   eGFR (African American) >60   eGFR (Non-African American) >60 (eGFR values <71m/min/1.73 m2 may be an indication of chronic kidney disease (CKD). Calculated eGFR is useful in patients with stable renal function. The eGFR calculation will not be reliable in acutely ill patients when serum creatinine is changing rapidly. It is not useful in  patients on dialysis. The eGFR calculation may not be applicable to patients at the low and high extremes of body sizes, pregnant women, and vegetarians.)   Anion Gap 8  Routine Hem:  13-Jul-13 06:27    WBC (CBC)  3.2   RBC (CBC)  3.72   Hemoglobin (CBC)  11.5   Hematocrit (CBC)  33.2   Platelet Count (CBC) 172   MCV 89   MCH 30.8   MCHC 34.6   RDW 13.6   Neutrophil % 63.1   Lymphocyte % 21.4   Monocyte % 10.3   Eosinophil % 4.4    Basophil % 0.8   Neutrophil # 2.0   Lymphocyte #  0.7   Monocyte # 0.3   Eosinophil # 0.1   Basophil # 0.0 (Result(s) reported on 17 Jun 2012 at 07:08AM.)   Assessment/Plan:  Assessment/Plan:   Assessment Diarrhea, ? radiation entritis. patient was started on Pentasa. Doubt that capsule is still in small bowel.    Plan Continue Pentasa. Repeat KUB on Monday. If no response to Pentasa, will consider Entocort.   Electronic Signatures: IJill Side(MD)  (Signed 13-Jul-13 14:35)  Authored: Chief Complaint, VITAL SIGNS/ANCILLARY NOTES, Brief Assessment, Lab Results, Assessment/Plan   Last Updated: 13-Jul-13 14:35 by IJill Side(MD)

## 2015-03-30 NOTE — Consult Note (Signed)
Brief Consult Note: Diagnosis: radiation enterocolitis, chronic abdominal pain, hx of pelvic radiation.   Patient was seen by consultant.   Consult note dictated.   Discussed with Attending MD.   Comments: No surgical indications at present, needs GI workup and medical evaluation will follow as consult.  Discussed in detail with patient and family present.  I asked Dr. Ulice Brilliant to have medicine admit the patient.  Electronic Signatures: Sherri Rad (MD)  (Signed 04-Jul-13 20:43)  Authored: Brief Consult Note   Last Updated: 04-Jul-13 20:43 by Sherri Rad (MD)

## 2015-03-30 NOTE — Consult Note (Signed)
Chief Complaint:   Subjective/Chief Complaint Less abd pain and nausea. Resolution of SB partial obstruction at least on x-rays. Diet being advanced.   VITAL SIGNS/ANCILLARY NOTES: **Vital Signs.:   07-Jul-13 05:27   Vital Signs Type Routine   Temperature Temperature (F) 98   Celsius 36.6   Temperature Source oral   Pulse Pulse 72   Respirations Respirations 20   Systolic BP Systolic BP 403   Diastolic BP (mmHg) Diastolic BP (mmHg) 85   Mean BP 102   Pulse Ox % Pulse Ox % 95   Pulse Ox Activity Level  At rest   Oxygen Delivery Room Air/ 21 %   Brief Assessment:   Cardiac Regular    Respiratory clear BS    Gastrointestinal mild abd tendernes   Lab Results: Routine Chem:  07-Jul-13 04:21    Glucose, Serum 81   BUN  5   Creatinine (comp) 0.75   Sodium, Serum 144   Potassium, Serum 3.8   Chloride, Serum  110   CO2, Serum 28   Calcium (Total), Serum 8.5   Anion Gap  6   Osmolality (calc) 283   eGFR (African American) >60   eGFR (Non-African American) >60 (eGFR values <78m/min/1.73 m2 may be an indication of chronic kidney disease (CKD). Calculated eGFR is useful in patients with stable renal function. The eGFR calculation will not be reliable in acutely ill patients when serum creatinine is changing rapidly. It is not useful in  patients on dialysis. The eGFR calculation may not be applicable to patients at the low and high extremes of body sizes, pregnant women, and vegetarians.)  Routine Hem:  07-Jul-13 04:21    WBC (CBC)  2.5   RBC (CBC)  3.52   Hemoglobin (CBC)  10.8   Hematocrit (CBC)  31.3   Platelet Count (CBC) 183   MCV 89   MCH 30.7   MCHC 34.5   RDW 13.3   Neutrophil % 62.1   Lymphocyte % 22.1   Monocyte % 9.7   Eosinophil % 5.3   Basophil % 0.8   Neutrophil # 1.6   Lymphocyte #  0.6   Monocyte # 0.2   Eosinophil # 0.1   Basophil # 0.0 (Result(s) reported on 11 Jun 2012 at 0[facility])   Radiology Results: XRay:    07-Jul-13 07:40, Abdomen  Flat and Erect   Abdomen Flat and Erect    REASON FOR EXAM:    SBO  COMMENTS:       PROCEDURE: DXR - DXR ABDOMEN 2 V FLAT AND ERECT  - Jun 11 2012  7:40AM     RESULT: Comparisons: 06/10/2012    Findings:     Supine and upright views of the abdomen are provided.     There is a nonspecific bowel gas pattern. There is no bowel dilatation to   suggest obstruction. There is contrast material within the colon from   recent CT of the abdomen. There are a few colonic air-fluid levels. There   is no pathologic calcification along the expected courseof the ureters.     There is no evidence of pneumoperitoneum, portal venous gas, or   pneumatosis.     The osseous structures are unremarkable.    IMPRESSION:      No evidence of bowel obstruction.     Dictation Site: 1          Verified By: HJennette Banker M.D., MD   Assessment/Plan:  Assessment/Plan:   Assessment pSBO resolved.  Plan Will see how patient does on solids. Discussed options of doing EGD later with SB bx( though  doubt scope will reach the problem site), SB series, or video capsule study ( with risk of capsule getting lodged) if abd pain does not resolve.   Electronic Signatures: Verdie Shire (MD)  (Signed 07-Jul-13 11:42)  Authored: Chief Complaint, VITAL SIGNS/ANCILLARY NOTES, Brief Assessment, Lab Results, Radiology Results, Assessment/Plan   Last Updated: 07-Jul-13 11:42 by Verdie Shire (MD)

## 2015-03-30 NOTE — H&P (Signed)
PATIENT NAME:  [name] Graham, [name] Graham MR#:  [id number] DATE OF BIRTH:  Jan 27, 1951  DATE OF ADMISSION:  06/08/2012  PRIMARY CARE PHYSICIAN: [name], MD  ONCOLOGIST: Forest Gleason, MD  CHIEF COMPLAINT: Nausea, vomiting, and diarrhea on and off for several months, more worse since this Saturday.   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 The patient has also been having intermittent nausea and had vomiting several times this past Saturday along with several diarrheal bowel movements. The patient was seen at urgent care in [place] and given IV fluids and p.r.n. nausea medicine and sent home. She called [facility] and was prescribed some medication for her diarrhea and vomiting. She continued to have some nausea and vomiting hence came to the Emergency Room. CT of the abdomen was done in the Emergency Room which is indicative of partial mid to distal small bowel obstruction which may be related to adhesion in the anterior aspect of the lower abdomen and pelvis and/or possibility of radiation enteritis. She was seen by Dr. Sherri Rad, surgery, who recommends no surgical intervention, conservative management, hence Internal Medicine was consulted for admission.  MEDICATIONS:  1. Zantac 150 mg daily.  2. Sertraline 50 mg daily.  3. Percocet 5/325 mg 1 to 2 every four hours p.r.n.   PAST MEDICAL HISTORY:  1. Migraines.  2. Anxiety disorder.  3. Stage 3, B adenocarcinoma of the endometrium, status post total abdominal hysterectomy and bilateral salpingo-oophorectomy, status post chemotherapy and radiation,  last radiation was four months ago.  4. Tubal ligation.  5. Breast cyst removal.  6. Sinus surgery.  7. Right knee surgery.   ALLERGIES: Penicillin and tape.   FAMILY HISTORY: Colon cancer in mother.  REVIEW OF SYSTEMS: CONSTITUTIONAL: Positive for fatigue and weakness. EYES: No blurred or double vision. No glaucoma. ENT: No tinnitus, ear pain, or hearing loss. RESPIRATORY: No cough, wheeze, hemoptysis, or chronic obstructive pulmonary disease. CARDIOVASCULAR: No chest pain, orthopnea, edema, or hypertension. GASTROINTESTINAL: Positive for nausea, vomiting, diarrhea, and abdominal pain. GU: No dysuria or hematuria. ENDOCRINE: No polyuria or nocturia. HEMATOLOGY: No anemia or easy bruising. SKIN: No acne or rash. MUSCULOSKELETAL: Positive for arthritis. NEUROLOGIC: No cerebrovascular accident or transient ischemic attack. PSYCH: Positive for some anxiety. All other systems reviewed and negative.   LABORATORY AND RADIOLOGIC DATA: CT of the abdomen shows evidence of partial mid to distal small bowel obstruction which may be related to an adhesion in the anterior aspect of the lower abdomen and upper pelvis. There is a small amount of fluid in the mesenteric fat. Possibility of radiation enteritis also should be considered given the appearance of the small bowel in the pelvis on previous CT. There is no acute abnormality of the colon. No evidence of acute hepatobiliary abnormality or urinary tract abnormality.   Urinalysis negative for urinary tract infection.   CBC within normal limits. Comprehensive metabolic panel within normal limits. Lipase 87.   ASSESSMENT: 64 year old [name] Graham with:  1. 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 There is no  evidence of acute obstruction per Dr. Marina Gravel and conservative management  recommended. The patient will be admitted to Dr. Raylene Miyamoto service. We will give the patient a clear liquid diet, IV fluids, and p.r.n. Zofran. Continue home medications and p.r.n. Percocet for abdominal pain. Diet will be advanced as tolerated. The patient is requesting Gastroenterology consult. She sees Dr. Candace Cruise as an outpatient.  2. 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  3. Anxiety and depression. We will continue Zoloft at this time.   Further work-up according to the patient's clinical course. The hospital admission plan was discussed with the patient. No family members were present. The patient is a FULL CODE.  TIME SPENT: 50 minutes. ____________________________ Hart Rochester Posey Pronto, MD sap:slb D: 06/08/2012 21:31:03 ET T: 06/09/2012 09:36:34 ET JOB#: [id number]  cc: Aarron Wierzbicki A. Posey Pronto, MD, <Dictator> [name], MD  Ilda Basset MD ELECTRONICALLY SIGNED 06/09/2012 17:13

## 2015-03-30 NOTE — Consult Note (Signed)
Chief Complaint:   Subjective/Chief Complaint Feeling better. Some abd pain with eating. KUB showed no capsule remaining. Received laxatives over the weekend.   VITAL SIGNS/ANCILLARY NOTES: **Vital Signs.:   15-Jul-13 10:40   Vital Signs Type Routine   Temperature Temperature (F) 98.2   Celsius 36.7   Temperature Source oral   Pulse Pulse 92   Respirations Respirations 18   Systolic BP Systolic BP 116   Diastolic BP (mmHg) Diastolic BP (mmHg) 81   Mean BP 94   Pulse Ox % Pulse Ox % 97   Pulse Ox Activity Level  At rest   Oxygen Delivery Room Air/ 21 %   Brief Assessment:   Cardiac Regular    Respiratory clear BS    Gastrointestinal mild lower abd tenderness   Lab Results: Hepatic:  13-Jul-13 06:27    Bilirubin, Total 0.3   Alkaline Phosphatase 128   SGPT (ALT)  80 (12-78 NOTE: NEW REFERENCE RANGE 10/29/2011)   SGOT (AST)  97   Total Protein, Serum  6.2   Albumin, Serum  3.3  Routine Chem:  13-Jul-13 06:27    Glucose, Serum 81   BUN 7   Creatinine (comp) 0.79   Sodium, Serum 145   Potassium, Serum 3.6   Chloride, Serum  108   CO2, Serum 29   Calcium (Total), Serum 8.5   Osmolality (calc) 286   eGFR (African American) >60   eGFR (Non-African American) >60 (eGFR values <3m/min/1.73 m2 may be an indication of chronic kidney disease (CKD). Calculated eGFR is useful in patients with stable renal function. The eGFR calculation will not be reliable in acutely ill patients when serum creatinine is changing rapidly. It is not useful in  patients on dialysis. The eGFR calculation may not be applicable to patients at the low and high extremes of body sizes, pregnant women, and vegetarians.)   Anion Gap 8  Routine Hem:  13-Jul-13 06:27    WBC (CBC)  3.2   RBC (CBC)  3.72   Hemoglobin (CBC)  11.5   Hematocrit (CBC)  33.2   Platelet Count (CBC) 172   MCV 89   MCH 30.8   MCHC 34.6   RDW 13.6   Neutrophil % 63.1   Lymphocyte % 21.4   Monocyte % 10.3    Eosinophil % 4.4   Basophil % 0.8   Neutrophil # 2.0   Lymphocyte #  0.7   Monocyte # 0.3   Eosinophil # 0.1   Basophil # 0.0 (Result(s) reported on 17 Jun 2012 at [facility])   Radiology Results: XRay:    15-Jul-13 07:53, Abdomen Flat and Erect   Abdomen Flat and Erect    PRELIMINARY REPORT    The following is a PRELIMINARY Radiology report.  A final report will follow pending radiologist verification.      REASON FOR EXAM:    capsule and contrast passage  COMMENTS:       PROCEDURE: DXR - DXR ABDOMEN 2 V FLAT AND ERECT  - Jun 19 2012  7:53AM     RESULT: Comparison is made to a prior study dated 06/11/2012.    Findings: Air is seen within nondilated loops of large and small bowel.   There is a paucity of residual contrast. A moderate amount stool is   appreciated within the colon. Within the ascending region, there is a   moderate to large amount of stool. There is otherwise no radiographic   evidence of radiopaque foreign body to suggest  a retained bowel capsule.   The visualizedbony skeleton is unremarkable.    IMPRESSION:    1. Trace amount of residual contrast within the visualized portions of   the bowel.  2. Stool as described above.  3. No definite evidence to suggest a retained bowel observation capsule.    Thank you for the opportunity to contribute to the care of your patient.           Dictated By: Mikki Santee, M.D., MD   Assessment/Plan:  Assessment/Plan:   Assessment Radiation enteritis?    Plan If pain tolerable, patient can be discharged later today on questran and pentasa 1g QID( 517m pentasa available. Not available in the hospital). Would try pentasa for at least 4 wks before stopping. Pt can f/u with [facility] 2 wks. Should also f/u with Dr. MPearson Grippe Thanks.   Electronic Signatures: OVerdie Shire(MD)  (Signed 15-Jul-13 10:50)  Authored: Chief Complaint, VITAL SIGNS/ANCILLARY NOTES, Brief Assessment, Lab Results, Radiology Results,  Assessment/Plan   Last Updated: 15-Jul-13 10:50 by OVerdie Shire(MD)

## 2015-03-30 NOTE — Consult Note (Signed)
Chief Complaint:   Subjective/Chief Complaint Recurrent diarrhea after starting solids yest. Some nausea.   VITAL SIGNS/ANCILLARY NOTES: **Vital Signs.:   08-Jul-13 13:23   Vital Signs Type Routine   Temperature Temperature (F) 97.9   Celsius 36.6   Temperature Source oral   Pulse Pulse 63   Respirations Respirations 18   Systolic BP Systolic BP 539   Diastolic BP (mmHg) Diastolic BP (mmHg) 82   Mean BP 105   Pulse Ox % Pulse Ox % 97   Pulse Ox Activity Level  At rest   Oxygen Delivery Room Air/ 21 %   Brief Assessment:   Cardiac Regular    Respiratory clear BS    Gastrointestinal low abd tenderness   Lab Results: Routine Chem:  08-Jul-13 05:48    Glucose, Serum 83   BUN  6   Creatinine (comp) 0.72   Sodium, Serum 145   Potassium, Serum 3.5   Chloride, Serum 106   CO2, Serum 28   Calcium (Total), Serum 8.8   Anion Gap 11   Osmolality (calc) 285   eGFR (African American) >60   eGFR (Non-African American) >60 (eGFR values <62m/min/1.73 m2 may be an indication of chronic kidney disease (CKD). Calculated eGFR is useful in patients with stable renal function. The eGFR calculation will not be reliable in acutely ill patients when serum creatinine is changing rapidly. It is not useful in  patients on dialysis. The eGFR calculation may not be applicable to patients at the low and high extremes of body sizes, pregnant women, and vegetarians.)  Routine Hem:  08-Jul-13 05:48    WBC (CBC)  3.0   RBC (CBC)  3.77   Hemoglobin (CBC)  11.5   Hematocrit (CBC)  33.8   Platelet Count (CBC) 199   MCV 90   MCH 30.4   MCHC 33.9   RDW 13.2   Neutrophil % 61.6   Lymphocyte % 21.4   Monocyte % 10.2   Eosinophil % 6.0   Basophil % 0.8   Neutrophil # 1.9   Lymphocyte #  0.7   Monocyte # 0.3   Eosinophil # 0.2   Basophil # 0.0 (Result(s) reported on 12 Jun 2012 at 07:17AM.)   Assessment/Plan:  Assessment/Plan:   Assessment Radiation enteritis?    Plan Ok to resume  liquid diet again. Will order UGI with SBFT tomorrow. [facility] Will have DEbony Cargoto see patient tomorrow. Thanks.   Electronic Signatures: OVerdie Shire(MD)  (Signed 08-Jul-13 14:25)  Authored: Chief Complaint, VITAL SIGNS/ANCILLARY NOTES, Brief Assessment, Lab Results, Assessment/Plan   Last Updated: 08-Jul-13 14:25 by OVerdie Shire(MD)

## 2015-03-30 NOTE — Consult Note (Signed)
Pt seen and examined. See [name] Graham's notes. Recent colon normal. Currrent sxs may be radiation induced. If so, medical treatment may not work very well. If sxs improve, gradually advance diet as tolerated. If sxs worsen again, then will consider EGD with SB bx if no evidence of obstruction. Thanks  Electronic Signatures: Verdie Shire (MD)  (Signed on 05-Jul-13 19:54)  Authored  Last Updated: 05-Jul-13 19:54 by Verdie Shire (MD)

## 2015-04-02 ENCOUNTER — Other Ambulatory Visit: Payer: Self-pay | Admitting: *Deleted

## 2015-04-02 DIAGNOSIS — C541 Malignant neoplasm of endometrium: Secondary | ICD-10-CM

## 2015-04-06 NOTE — Discharge Summary (Signed)
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 progressing disease status post chemotherapy    Chief Complaint/History of Present Illness Subjective: Chief Complaint/Diagnosis:  04/2011   papillary serous adenocarcinoma of the endometrium, stage IIIb (nodal metastasis)              TAHBSO and full staging,               GOG protocl using XRT and chemotherapy, significant XRT side-effects,               continued on chemotherapy off protocol, 04/2013   recurrence in the nodal areas,              DDP and docetaxel x 6 completed in December,   started on chemotherapy with Adriamycin(October, 2015) poor tolerance to letrozole  and affinator Estrogen and progesterone receptor negative tumor         hER-2/neu recetor negative. November 07, 2014 progressive disease by  tumor marker criteria and clinical examination November 12, 2014 Patient was started on cis-platinum and topotecan   Chief Complaint/History of Present Illness cont'd HPI:  [name] here as an had on complaining persistent nausea and feeling extremely weak tired.  Acing describes  vomiting 4 times yesterday and one time it was like stool.  Patient also had diarrhea before but since last night her diarrhea stopped.  She is passing some gas.  Abdominal discomfort and pain.  Back pain is improved.  Patient has of recurrent carcinoma off endometrium which is estrogen receptor progesterone receptor negative and HER-2/neu receptor negative.  Patient received chemotherapy with abdomen Toprol D can few days ago  (December 15) was also getting Neupogen.  Today patient is myelosuppressive.  Has anemia.  Thrombocytopenia.  And vomiting suggesting possibility of small bowel obstruction. In view of all this finding patient was admitted in the hospital   Allergies:  Penicillin: Rash  Tape: Rash  Routine Micro:  21-Dec-15 12:46   Micro Text Report STOOL COMPREHENSIVE   COMMENT                   NO SALMONELLA OR SHIGELLA ISOLATED   COMMENT                   NO PATHOGENIC E.COLI DETECTED   COMMENT                   NO CAMPYLOBACTER ANTIGEN DETECTED   ANTIBIOTIC                        Culture Comment NO SALMONELLA OR SHIGELLA ISOLATED  Culture Comment . NO PATHOGENIC E.COLI DETECTED  Culture Comment    . NO CAMPYLOBACTER ANTIGEN DETECTED  Result(s) reported on 27 Nov 2014 at 09:44AM.  Routine Chem:  20-Dec-15 04:29   Result Comment LABS - This specimen was collected through an   - indwelling catheter or arterial line.  - A minimum of 62mls of blood was wasted prior    - to collecting the sample.  Interpret  - results with caution. WBC/PLATELET - RESULTS VERIFIED BY REPEAT  TESTING. PLATELET - VERIFIED BY SMEAR ESTIMATE WBC/PLATELET - C/[name] Graham/[id number]  - NOTIFIED OF CRITICAL VALUE  - READ-BACK PROCESS PERFORMED. DIFFERENTIAL - SMEAR SCANNED  Result(s) reported on 24 Nov 2014 at 06:32AM.  Glucose, Serum 66  BUN  6  Creatinine (comp) 0.69  Sodium, Serum 139  Potassium, Serum 4.0  Chloride, Serum 104  CO2, Serum 28  Calcium (Total), Serum  7.7  Osmolality (calc) 273  eGFR (African American) >60  eGFR (Non-African American) >60 (eGFR values <70m/min/1.73 m2 may be an indication of chronic kidney disease (CKD). Calculated eGFR, using the MRDR Study equation, is useful in  patients with stable renal function. The eGFR calculation will not be reliable in acutely ill patients when serum creatinine is changing rapidly. It is not useful in patients on dialysis. The eGFR  calculation may not be applicable to patients at the low and high extremes of body sizes, pregnant women, and vegetarians.)  Anion Gap 7  Magnesium, Serum  1.5 (1.8-2.4 THERAPEUTIC RANGE: 4-7 mg/dL TOXIC: > 10 mg/dL  -----------------------)  21-Dec-15 04:56   Result Comment labs - This specimen was collected through an   - indwelling catheter or arterial line.  - A minimum of 550m of blood was wasted prior    - to collecting the sample.  Interpret  - results with caution. DIFFERENTIAL - SMEAR SCANNED PLATELET - VERIFIED BY SMEAR ESTIMATE  - CRITICAL VALUE PREVIOUSLY NOTIFIED.  Result(s) reported on 25 Nov 2014 at 07:18AM.  Glucose, Serum 71  BUN  4  Creatinine (comp) 0.73  Sodium, Serum 139  Potassium, Serum 4.1  Chloride, Serum 104  CO2, Serum 29  Calcium (Total), Serum  8.1  Osmolality (calc) 273  eGFR (African American) >60  eGFR (Non-African American) >60 (eGFR values <6070min/1.73 m2 may be an indication of chronic kidney disease (CKD). Calculated eGFR, using the MRDR Study equation, is useful in  patients with stable renal function. The eGFR calculation will not be reliable in acutely ill patients when serum creatinine is changing rapidly. It is not useful in patients on dialysis. The eGFR calculation may not be applicable to patients at the low and high extremes of body sizes, pregnant women, and vegetarians.)  Anion Gap  6  Magnesium, Serum 1.8 (1.8-2.4 THERAPEUTIC RANGE: 4-7 mg/dL TOXIC: > 10 mg/dL  -----------------------)  Routine Hem:  20-Dec-15 04:29   WBC (CBC)  1.1  RBC (CBC)  3.21  Hemoglobin (CBC)  8.5  Hematocrit (CBC)  25.8  Platelet Count (CBC)  21  MCV 80  MCH 26.6  MCHC 33.1  RDW  16.8  Neutrophil % 46.3  Lymphocyte % 31.4  Monocyte % 10.6  Eosinophil % 10.9  Basophil % 0.8  Neutrophil #  0.5  Lymphocyte #  0.3  Monocyte #  0.1  Eosinophil # 0.1  Basophil # 0.0  21-Dec-15 04:56   WBC (CBC) 3.8  RBC (CBC)  3.25  Hemoglobin (CBC)   8.7  Hematocrit (CBC)  26.2  Platelet Count (CBC)  17  MCV 81  MCH 26.7  MCHC 33.1  RDW  16.6  Neutrophil % 75.5  Lymphocyte % 12.0  Monocyte % 7.1  Eosinophil % 4.9  Basophil % 0.5  Neutrophil # 2.9  Lymphocyte #  0.5  Monocyte # 0.3  Eosinophil # 0.2  Basophil # 0.0   PERTINENT RADIOLOGY STUDIES: XRay:    18-Dec-15 15:52, Abdomen 3 Way Includes PA Chest  Abdomen 3 Way Includes PA Chest   REASON FOR EXAM:  abdominal pain - call MD with report  COMMENTS:       PROCEDURE: DXR - DXR ABDOMEN 3-WAY (INCL PA CXR)  - Nov 22 2014  3:52PM     CLINICAL DATA:  Abdominal pain    EXAM:  ABDOMEN SERIES    COMPARISON:  12/26/2013    FINDINGS:  Cardiomediastinal silhouette is unremarkable. No acute infiltrate or  pleural effusion. No pulmonary edema. Right IJ Port-A-Cath in place.  Distended small bowel loops are noted mid abdomen with air-fluid  levels highly suspicious for partial bowel obstruction or ileus.     IMPRESSION:  No acute disease within chest. Distended small bowel loops mid  abdomen with air-fluid levels highly suspicious for ileus or partial  bowel obstruction.      Electronically Signed    By: Lahoma Crocker M.D.    On: 11/22/2014 15:58         Verified By: Ephraim Hamburger, M.D.,    21-Dec-15 19:12, Abdomen 3 Way Includes PA Chest  Abdomen 3 Way Includes PA Chest   REASON FOR EXAM:    abdominal distention  follow up  COMMENTS:       PROCEDURE: DXR - DXR ABDOMEN 3-WAY (INCL PA CXR)  - Nov 25 2014  7:12PM     CLINICAL DATA:  Acute onset of abdominal distention. Subsequent  encounter.    EXAM:  ABDOMEN SERIES    COMPARISON:  Abdominal radiograph performed 11/23/2014, and chest  radiograph from 06/18/2011    FINDINGS:  The lungs are well-aerated and clear. There is no evidence of focal  opacification, pleural effusion or pneumothorax. The  cardiomediastinal silhouette is within normal limits. A right-sided  chest port is noted ending about the mid  SVC.    The visualized bowel gas pattern is unremarkable. Scattered fluid  and air are seen within the colon; there is no evidence of small  bowel dilatation to suggest obstruction. No free intra-abdominal air  is identified on the provided upright view.    No acute osseous abnormalities are seen; the sacroiliac joints are  unremarkable in appearance.     IMPRESSION:  1. Unremarkable bowel gas pattern; no evidence of bowel obstruction.  Air and fluid noted within the colon. No free intra-abdominal air  seen.  2. No acute cardiopulmonary process identified.      Electronically Signed    By: Garald Balding M.D.    On: 11/25/2014 23:17         Verified By: JEFFREY . Radene Knee, M.D.,  [place]:    18-Dec-15 15:52, Abdomen 3 Way Includes PA Chest  PACS Image    Pertinent Past History:  Pertinent Past History Significant History/PMH: ??  Oncology Protocol:  ??  benign breast disease:  ??  migraines:  ??  anxiety disorder:  ??  endometrial cancer:  ??  Tubal Ligation:  ??  breast cyst removal, benign:  ??  sinus surgery:  ??  right knee surgery:   Hospital Course:  Hospital Course during hospital stay three-way abdomen reveal paralytic ileus most likely secondary to chemotherapy.  With conservative therapy gradually abdominal x-ray showed improvement.  Patient started having less nausea and vomiting started tolerating oral diet.  As well as had a bowel movement Had a severe myelosuppression with anemia requiring blood transfusion and thrombocytopenia requiring platelet transfusion Abdominal pain gradually improved with improvement in paralytic ileus overall prognosis was guarded and patient was discharged with improved general status   Condition on Discharge Guarded   Code  Status:  Code Status No Code/Do Not Resuscitate   DISCHARGE INSTRUCTIONS HOME MEDS:  Medication Reconciliation: Patient's Home Medications at Discharge:     Medication Instructions  amitriptyline  25 mg oral tablet  1 tab(s) orally once a day (at bedtime)   fentanyl 12 mcg/hr transdermal film, extended release  1 patch transdermal every 72 hours   prednisone 20 mg oral tablet  1 tab(s) orally once a day   acetaminophen-hydrocodone 325 mg-5 mg oral tablet  1 tab(s) orally every 6 hours, As Needed - for Pain   ondansetron 4 mg oral tablet  1 tab(s) orally every 6 hours, As Needed   sertraline 50 mg oral tablet  1 tab(s) orally once a day (in the morning)   fentanyl 25 mcg/hr transdermal film, extended release  1 patch transdermal every 72 hours    STOP TAKING THE FOLLOWING MEDICATION(S):    phenergan 25 mg rectal suppository: 1 suppository(ies) rectal every 6 hours, As Needed - for Nausea, Vomiting if not controlled with zofran xanax 0.5 mg oral tablet: 1 tab(s) orally 2 times a day, As Needed - for Anxiety, Nervousness  Physician's Instructions:  Home Health? No   Treatments None   Home Oxygen? No   Diet Regular   Dietary Supplements None   Diet Consistency Regular Consistency   Activity Limitations As tolerated   Return to Work Not Applicable   Time frame for Follow Up Appointment 1-2 weeks   Electronic Signatures: Jobe Gibbon (MD)  (Signed 04-Jan-16 16:57)  Authored: ADMISSION DATE AND DIAGNOSIS, CHIEF COMPLAINT/HPI, Allergies, PERTINENT LABS, PERTINENT RADIOLOGY STUDIES, PERTINENT PAST HISTORY, HOSPITAL COURSE, DISCHARGE INSTRUCTIONS HOME MEDS, PATIENT INSTRUCTIONS   Last Updated: 04-Jan-16 16:57 by Jobe Gibbon (MD)

## 2015-04-06 NOTE — Discharge Summary (Signed)
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   Allergies:  Penicillin: Rash  Tape: Rash  Pertinent Past History:  Pertinent Past History Endometrial cancer, severe anxiety, anemia   Hospital Course:  Hospital Course Patient has returned to her baseline following syncopal episode in infusion suite at [facility]. Likely related to hypotension.   Condition on Discharge Stable   DISCHARGE INSTRUCTIONS HOME MEDS:  Medication Reconciliation: Patient's Home Medications at Discharge:     Medication Instructions  amitriptyline 25 mg oral tablet  1 tab(s) orally once a day (at bedtime)   sertraline 50 mg oral tablet  1 tab(s) orally once a day (in the morning)   chlorpheniramine-hydrocodone 8 mg-10 mg/5 ml oral suspension, extended release  5 milliliter(s) orally every 12 hours, As Needed   alprazolam 0.5 mg oral tablet  1 tab(s) orally orally 3 times a day and at bedtime as needed for nervousness and anxiety   acetaminophen-hydrocodone 325 mg-5 mg oral tablet  1 tab(s) orally every 6 hours, As Needed - for Pain   ondansetron 4 mg oral tablet  1 tab(s) orally every 6 hours, As Needed   prednisone 20 mg oral tablet  1 tab(s) orally once a day   fentanyl  37 microgram(s) topically every 3 days   levofloxacin 500 mg oral tablet  1 tab(s) orally once a day   pantoprazole 40 mg oral delayed release tablet  1 tab(s) orally 2 times a day     Physician's Instructions:  Home Health? No   Treatments None   Home Oxygen? No   Diet Regular   Activity Limitations None    Referrals None   Return to Work Not Applicable   Time frame for Follow Up Appointment 1-2 weeks   Electronic Signatures: Zaydan Papesh, Martie Lee (MD)  (Signed 16-Mar-16 08:30)  Authored: ADMISSION DATE AND DIAGNOSIS, Allergies, DISCHARGE INSTRUCTIONS HOME MEDS, PATIENT INSTRUCTIONS Herring, Magda Paganini (NP)  (Signed 16-Mar-16 10:15)  Authored: ADMISSION DATE AND DIAGNOSIS, CHIEF COMPLAINT/HPI, PERTINENT PAST HISTORY, HOSPITAL COURSE, DISCHARGE INSTRUCTIONS HOME MEDS   Last Updated: 16-Mar-16 10:15 by [name] (NP)

## 2015-04-07 ENCOUNTER — Other Ambulatory Visit: Payer: Self-pay

## 2015-04-07 ENCOUNTER — Encounter: Payer: Self-pay | Admitting: Oncology

## 2015-04-07 ENCOUNTER — Ambulatory Visit: Payer: Self-pay | Admitting: Oncology

## 2015-04-08 ENCOUNTER — Telehealth: Payer: Self-pay | Admitting: *Deleted

## 2015-04-08 DIAGNOSIS — C541 Malignant neoplasm of endometrium: Secondary | ICD-10-CM

## 2015-04-08 MED ORDER — PREDNISONE 20 MG PO TABS: 20.0000 mg | ORAL_TABLET | Freq: Every day | ORAL | Status: DC

## 2015-04-08 MED ORDER — FENTANYL 50 MCG/HR TD PT72: 50.0000 ug | MEDICATED_PATCH | TRANSDERMAL | Status: DC

## 2015-04-08 MED ORDER — POTASSIUM CHLORIDE ER 10 MEQ PO TBCR: 10.0000 meq | EXTENDED_RELEASE_TABLET | Freq: Every day | ORAL | Status: DC

## 2015-04-08 NOTE — Telephone Encounter (Signed)
Having back pain that would MD to be aware of for when comes in next. Needs refills on fentanyl patch, potassium tabs, prednisone. Informed pt that can pick up prescription for fentanyl at the clinic and the other prescriptions will be called into pharmacy. Pt verbalized understanding.

## 2015-04-17 ENCOUNTER — Inpatient Hospital Stay: Payer: Medicare Other | Attending: Oncology | Admitting: Oncology

## 2015-04-17 ENCOUNTER — Other Ambulatory Visit: Payer: Self-pay | Admitting: *Deleted

## 2015-04-17 ENCOUNTER — Telehealth: Payer: Self-pay | Admitting: *Deleted

## 2015-04-17 VITALS — BP 147/75 | HR 68 | Temp 97.5°F

## 2015-04-17 DIAGNOSIS — Z79899 Other long term (current) drug therapy: Secondary | ICD-10-CM | POA: Insufficient documentation

## 2015-04-17 DIAGNOSIS — Z5111 Encounter for antineoplastic chemotherapy: Secondary | ICD-10-CM | POA: Diagnosis not present

## 2015-04-17 DIAGNOSIS — R590 Localized enlarged lymph nodes: Secondary | ICD-10-CM | POA: Diagnosis not present

## 2015-04-17 DIAGNOSIS — C541 Malignant neoplasm of endometrium: Secondary | ICD-10-CM | POA: Diagnosis not present

## 2015-04-17 DIAGNOSIS — M545 Low back pain: Secondary | ICD-10-CM | POA: Insufficient documentation

## 2015-04-17 DIAGNOSIS — Z9221 Personal history of antineoplastic chemotherapy: Secondary | ICD-10-CM | POA: Diagnosis not present

## 2015-04-17 DIAGNOSIS — Z923 Personal history of irradiation: Secondary | ICD-10-CM | POA: Diagnosis not present

## 2015-04-17 DIAGNOSIS — Z8542 Personal history of malignant neoplasm of other parts of uterus: Secondary | ICD-10-CM

## 2015-04-17 DIAGNOSIS — M549 Dorsalgia, unspecified: Secondary | ICD-10-CM | POA: Insufficient documentation

## 2015-04-17 DIAGNOSIS — C55 Malignant neoplasm of uterus, part unspecified: Secondary | ICD-10-CM | POA: Diagnosis not present

## 2015-04-17 DIAGNOSIS — R109 Unspecified abdominal pain: Secondary | ICD-10-CM | POA: Diagnosis not present

## 2015-04-17 DIAGNOSIS — C770 Secondary and unspecified malignant neoplasm of lymph nodes of head, face and neck: Secondary | ICD-10-CM | POA: Diagnosis not present

## 2015-04-17 MED ORDER — FENTANYL 25 MCG/HR TD PT72: 25.0000 ug | MEDICATED_PATCH | TRANSDERMAL | Status: DC

## 2015-04-17 NOTE — Telephone Encounter (Signed)
Calledtot report low back is severe 8/10 more on left than right and wants to see doctor today. Ok per Dr Oliva Bustard to add on Patient agrees to 345 appt

## 2015-04-18 ENCOUNTER — Ambulatory Visit
Admission: RE | Admit: 2015-04-18 | Discharge: 2015-04-18 | Disposition: A | Discharge status: Home / Self Care | Payer: Medicare Other | Source: Ambulatory Visit | Attending: Oncology | Admitting: Oncology

## 2015-04-18 ENCOUNTER — Other Ambulatory Visit: Payer: Self-pay | Admitting: Oncology

## 2015-04-18 ENCOUNTER — Encounter: Payer: Self-pay | Admitting: Oncology

## 2015-04-18 DIAGNOSIS — K520 Gastroenteritis and colitis due to radiation: Secondary | ICD-10-CM | POA: Insufficient documentation

## 2015-04-18 DIAGNOSIS — C55 Malignant neoplasm of uterus, part unspecified: Secondary | ICD-10-CM

## 2015-04-18 DIAGNOSIS — Z8542 Personal history of malignant neoplasm of other parts of uterus: Secondary | ICD-10-CM | POA: Diagnosis present

## 2015-04-18 HISTORY — DX: Malignant neoplasm of uterus, part unspecified: C55

## 2015-04-18 MED ORDER — TECHNETIUM TC 99M MEDRONATE IV KIT
25.0000 | PACK | Freq: Once | INTRAVENOUS | Status: AC | PRN
Start: 2015-04-18 — End: 2015-04-18
  Administered 2015-04-18: 22.5 via INTRAVENOUS

## 2015-04-18 NOTE — Progress Notes (Signed)
[name] Graham @ [facility] [phone]  [phone]     [name] Graham OB: 03/08/1951  MR#: [id number]  FTD#:[id number]  [name], MD as PCP - General (Family Medicine) [name], MD (General Surgery)  CHIEF COMPLAINT:  Chief Complaint  Patient presents with  . Follow-up    Oncology History   Chief Complaint/Diagnosis:   The patient is a 64 year old with metastatic endometrial cancer who is seen for assessment during ongoing palliative radiation therapy.  04/2011   papillary serous adenocarcinoma of the endometrium, stage IIIb (nodal metastasis)              TAHBSO and full staging,               GOG protocl using XRT and chemotherapy, significant XRT side-effects,               continued on chemotherapy off protocol, 04/2013   recurrence in the nodal areas,              DDP and docetaxel x 6 completed in December,   started on chemotherapy with Adriamycin(October, 2015) poor tolerance to letrozole  and affinator Estrogen and progesterone receptor negative tumor         hER-2/neu recetor negative. November 07, 2014 progressive disease by  tumor marker criteria and clinical examination November 12, 2014 After 6 cycles of chemotherapy because of increasing side effect chemotherapy was discontinued.  (March, 2016) Patient was started on cis-platinum and topotecan. May, 2016 Cis-platinum into [facility] and was discontinued because of significant side effect and possibility of progressive disease.  Patient would be started on NIVOLULAMAB on compassionate use basis     Cancer of uterus   04/18/2015 Initial Diagnosis Cancer of uterus    No flowsheet data found.  INTERVAL HISTORY: 64 year old lady came today complaining of increasing pain in the left flank area.  Started few days ago is constant dull aching.  Left supraclavicular lymph node is increased in size.  Patient is extremely concerned about increasing pain and increase the  size of the left supraclavicular lymph node.  Poor appetite.  REVIEW OF SYSTEMS:   Patient  is feeling weak.  Tired.  Oozing weight.  Declining performance status.  PERFORMANCE STATUS (ECOG):  02 HEENT:  No visual changes, runny nose, sore throat, mouth sores or tenderness. Lungs: No shortness of breath or cough.  No hemoptysis. Cardiac:  No chest pain, palpitations, orthopnea, or PND. GI:  No nausea, vomiting, diarrhea, constipation, melena or hematochezia. GU:  No urgency, frequency, dysuria, or hematuria. Musculoskeletal:  No back pain.  No joint pain.  No muscle tenderness. Extremities:  No pain or swelling. Skin:  No rashes or skin changes. Neuro:  No headache, numbness or weakness, balance or coordination issues. Endocrine:  No diabetes, thyroid issues, hot flashes or night sweats. Psych:  No mood changes, depression or anxiety. Pain:  No focal pain. Review of systems:  All other systems reviewed and found to be negative.  Increasing dull aching pain in the left flank and mid back area.  Increasing left supraclavicular lymph node.  As per HPI. Otherwise, a complete review of systems is negatve.  PAST MEDICAL HISTORY: Past Medical History  Diagnosis Date  . Cancer 2012  . GI problem 2013  . Cancer of uterus 04/18/2015    PAST SURGICAL HISTORY: Past Surgical History  Procedure Laterality Date  . Abdominal hysterectomy      FAMILY HISTORY No significant past medical  history GYNECOLOGIC HISTORY:  No LMP recorded. Patient has had a hysterectomy.     ADVANCED DIRECTIVES:  Patient does have a living will  HEALTH MAINTENANCE: History  Substance Use Topics  . Smoking status: Never Smoker   . Smokeless tobacco: Never Used  . Alcohol Use: Yes        Allergies  Allergen Reactions  . Penicillins Rash    Current Outpatient Prescriptions  Medication Sig Dispense Refill  . ALPRAZolam (XANAX) 0.25 MG tablet     . bismuth subsalicylate (PEPTO BISMOL) 262 MG chewable  tablet Chew 524 mg by mouth as needed for indigestion.    . fentaNYL (DURAGESIC - DOSED MCG/HR) 50 MCG/HR Place 1 patch (50 mcg total) onto the skin every 3 (three) days. 10 patch 0  . fexofenadine (ALLEGRA) 180 MG tablet Take 180 mg by mouth daily.    . fluticasone (FLONASE) 50 MCG/ACT nasal spray Place 1-2 sprays into the nose daily as needed.    . ondansetron (ZOFRAN) 4 MG tablet Take 1 tablet by mouth every 4 (four) hours as needed.    Marland Kitchen oxybutynin (DITROPAN-XL) 10 MG 24 hr tablet Take 10 mg by mouth daily.    . potassium chloride (K-DUR) 10 MEQ tablet Take 1 tablet (10 mEq total) by mouth daily. 30 tablet 3  . predniSONE (DELTASONE) 20 MG tablet Take 1 tablet (20 mg total) by mouth daily with breakfast. 30 tablet 0  . ranitidine (ZANTAC) 150 MG tablet Take 1 tablet by mouth 2 (two) times daily.    . sertraline (ZOLOFT) 50 MG tablet Take 1 tablet by mouth daily.    . cholestyramine (QUESTRAN) 4 G packet Take 1 packet by mouth 3 (three) times daily as needed.     . fentaNYL (DURAGESIC - DOSED MCG/HR) 25 MCG/HR patch Place 1 patch (25 mcg total) onto the skin every 3 (three) days. Use with fentanyl patch 69mg for total 761m. 7 patch 0  . PENTASA 500 MG CR capsule Take 2 capsules by mouth 4 (four) times daily.    . polyethylene glycol powder (GLYCOLAX/MIRALAX) powder Take by mouth.     No current facility-administered medications for this visit.    OBJECTIVE:  Filed Vitals:   04/17/15 1621  BP: 147/75  Pulse: 68  Temp: 97.5 F (36.4 C)     There is no weight on file to calculate BMI.    ECOG FS:2 - Symptomatic, <50% confined to bed  PHYSICAL EXAM: GENERAL:  Patient is feeling weak and tired.  Somewhat depressed. MENTAL STATUS:  Alert and oriented to person, place and time. HEAD:  Alopecia.  Normocephalic, atraumatic, face symmetric, no Cushingoid features. EYES: pale.  Pupils equal round and reactive to light and accomodation.  No conjunctivitis or scleral icterus. ENT:   Oropharynx clear without lesion.  Tongue normal. Mucous membranes moist.  RESPIRATORY:  Clear to auscultation without rales, wheezes or rhonchi. CARDIOVASCULAR:  Regular rate and rhythm without murmur, rub or gallop.  ABDOMEN:  Soft, non-tender, with active bowel sounds, and no hepatosplenomegaly.  No masses. BACK:  No CVA tenderness.  No tenderness on percussion of the back or rib cage. SKIN:  No rashes, ulcers or lesions. EXTREMITIES: No edema, no skin discoloration or tenderness.  No palpable cords. LYMPH NODES: Palpable left supraclavicular lymph node is increased in size. NEUROLOGICAL: Unremarkable. PSYCH:  Appropriate.   LAB RESULTS:  No visits with results within 3 Day(s) from this visit. Latest known visit with results is:  Hospital Outpatient Visit  on 03/07/2015  Component Date Value Ref Range Status  . WBC 03/26/2015 10.2  3.6-11.0 x10 3/mm  Final  . RBC 03/26/2015 2.88* 3.80-5.20 x10 6/mm  Final  . HGB 03/26/2015 8.3* 12.0-16.0 g/dL Final  . HCT 03/26/2015 25.7* 35.0-47.0 % Final  . MCV 03/26/2015 89  80-100 fL Final  . MCH 03/26/2015 28.8  26.0-34.0 pg Final  . MCHC 03/26/2015 32.2  32.0-36.0 g/dL Final  . RDW 03/26/2015 17.0* 11.5-14.5 % Final  . Platelet 03/26/2015 292  150-440 x10 3/mm  Final  . Neutrophil % 03/26/2015 93.8   Final  . Lymphocyte % 03/26/2015 3.0   Final  . Monocyte % 03/26/2015 2.9   Final  . Eosinophil % 03/26/2015 0.0   Final  . Basophil % 03/26/2015 0.3   Final  . Neutrophil # 03/26/2015 9.6* 1.4-6.5 x10 3/mm  Final  . Lymphocyte # 03/26/2015 0.3* 1.0-3.6 x10 3/mm  Final  . Monocyte # 03/26/2015 0.3  0.2-0.9 x10 3/mm  Final  . Eosinophil # 03/26/2015 0.0  0.0-0.7 x10 3/mm  Final  . Basophil # 03/26/2015 0.0  0.0-0.1 x10 3/mm  Final  . Glucose, CSF 03/26/2015 DNP   Corrected   Comment: 65-99 NOTE: New Reference Range  02/11/15   . BUN 03/26/2015 25*  Final   Comment: 6-20 NOTE: New Reference Range  02/11/15   . Creatinine 03/26/2015  1.06*  Final   Comment: 0.44-1.00 NOTE: New Reference Range  02/11/15   . Sodium, Urine Random 03/26/2015 DNP   Corrected   Comment: 135-145 NOTE: New Reference Range  02/11/15   . Potassium, Urine Random 03/26/2015 DNP   Corrected   Comment: 3.5-5.1 NOTE: New Reference Range  02/11/15   . Chloride, Urine Random 03/26/2015 DNP   Corrected   Comment: 101-111 NOTE: New Reference Range  02/11/15   . Co2 03/26/2015 27   Final   Comment: 22-32 NOTE: New Reference Range  02/11/15   . Calcium, Total 03/26/2015 9.1   Final   Comment: 8.9-10.3 NOTE: New Reference Range  02/11/15   . SGOT(AST) 03/26/2015 15   Final   Comment: 15-41 NOTE: New Reference Range  02/11/15   . SGPT (ALT) 03/26/2015 17   Final   Comment: 14-54 NOTE: New Reference Range  02/11/15   . Alkaline Phosphatase 03/26/2015 131*  Final   Comment: 38-126 NOTE: New Reference Range  02/11/15   . Albumin 03/26/2015 3.2*  Final   Comment: 3.5-5.0 NOTE: New reference range  02/11/15   . Total Protein 03/26/2015 7.3   Final   Comment: 6.5-8.1 NOTE: New Reference Range  02/11/15   . Bilirubin,Total 03/26/2015 0.4   Final   Comment: 0.3-1.2 NOTE: New Reference Range  02/11/15   . Anion Gap 03/26/2015 9  7-16 Final  . EGFR (African American) 03/26/2015 >60   Final  . EGFR (Non-African Amer.) 03/26/2015 55*  Final   Comment: eGFR values <52m/min/1.73 m2 may be an indication of chronic kidney disease (CKD). Calculated eGFR is useful in patients with stable renal function. The eGFR calculation will not be reliable in acutely ill patients when serum creatinine is changing rapidly. It is not useful in patients on dialysis. The eGFR calculation may not be applicable to patients at the low and high extremes of body sizes, pregnant women, and vegetarians.   . Glucose 03/26/2015 209*  Final   Comment: 65-99 NOTE: New Reference Range  02/11/15   . Sodium 03/26/2015 132*  Final   Comment:  135-145 NOTE: New Reference Range  02/11/15   . Potassium 03/26/2015 4.8   Final   Comment: 3.5-5.1 NOTE: New Reference Range  02/11/15   . Chloride 03/26/2015 96*  Final   Comment: 101-111 NOTE: New Reference Range  02/11/15   . WBC 03/12/2015 9.5  3.6-11.0 x10 3/mm  Final  . RBC 03/12/2015 2.90* 3.80-5.20 x10 6/mm  Final  . HGB 03/12/2015 8.6* 12.0-16.0 g/dL Final  . HCT 03/12/2015 25.9* 35.0-47.0 % Final  . MCV 03/12/2015 89  80-100 fL Final  . MCH 03/12/2015 29.5  26.0-34.0 pg Final  . MCHC 03/12/2015 33.1  32.0-36.0 g/dL Final  . RDW 03/12/2015 17.3* 11.5-14.5 % Final  . Platelet 03/12/2015 286  150-440 x10 3/mm  Final  . Neutrophil % 03/12/2015 94.3   Final  . Lymphocyte % 03/12/2015 2.3   Final  . Monocyte % 03/12/2015 3.3   Final  . Eosinophil % 03/12/2015 0.0   Final  . Basophil % 03/12/2015 0.1   Final  . Neutrophil # 03/12/2015 8.9* 1.4-6.5 x10 3/mm  Final  . Lymphocyte # 03/12/2015 0.2* 1.0-3.6 x10 3/mm  Final  . Monocyte # 03/12/2015 0.3  0.2-0.9 x10 3/mm  Final  . Eosinophil # 03/12/2015 0.0  0.0-0.7 x10 3/mm  Final  . Basophil # 03/12/2015 0.0  0.0-0.1 x10 3/mm  Final    No results found for: LABCA2 No results found for: CA199 No results found for: CEA No results found for: PSA Lab Results  Component Value Date   CA125 18.8 07/23/2014     STUDIES: US Kidneys ([facility] Hx)  04/02/2015   CLINICAL DATA:  Left flank pain  EXAM: RENAL / URINARY TRACT ULTRASOUND COMPLETE  COMPARISON:  CT abdomen pelvis dated 02/18/2015  FINDINGS: Right Kidney:  Length: 9.4 cm.  No mass or hydronephrosis.  Left Kidney:  Length: 9.3 cm.  No mass or hydronephrosis.  Bladder:  Within normal limits.  IMPRESSION: Negative renal ultrasound.   Electronically Signed   By: Julian Hy M.D.   On: 04/02/2015 11:25    ASSESSMENT: Stage IV carcinoma of endometrium.  Progressing disease patient is off all chemotherapy. Increasing back pain most likely secondary to progressing  retroperitoneal lymphadenopathy possibility of bone metastases cannot be ruled out  MEDICAL DECISION MAKING:  Consider NIVOLULAMAB we have approval to use that on compassionate use from [facility]. Bone scan to be sure we are not dealing with any bone metastases in the ribs or spine Increase fentanyl patch 75 g  Patient expressed understanding and was in agreement with this plan. She also understands that She can call clinic at any time with any questions, concerns, or complaints.    Cancer of uterus   Staging form: Corpus Uteri - Carcinoma, AJCC 7th Edition     Clinical: Stage IVB (T3b, N2, M1) - Signed by Forest Gleason, MD on 04/18/2015   Forest Gleason, MD   04/18/2015 8:58 AM

## 2015-04-21 ENCOUNTER — Inpatient Hospital Stay: Payer: Medicare Other

## 2015-04-21 ENCOUNTER — Inpatient Hospital Stay (HOSPITAL_BASED_OUTPATIENT_CLINIC_OR_DEPARTMENT_OTHER): Payer: Medicare Other | Admitting: Oncology

## 2015-04-21 ENCOUNTER — Encounter: Payer: Self-pay | Admitting: Oncology

## 2015-04-21 VITALS — BP 125/73 | HR 77 | Wt 138.2 lb

## 2015-04-21 VITALS — BP 120/72 | HR 69 | Resp 18

## 2015-04-21 DIAGNOSIS — M545 Low back pain: Secondary | ICD-10-CM

## 2015-04-21 DIAGNOSIS — Z923 Personal history of irradiation: Secondary | ICD-10-CM

## 2015-04-21 DIAGNOSIS — R109 Unspecified abdominal pain: Secondary | ICD-10-CM | POA: Diagnosis not present

## 2015-04-21 DIAGNOSIS — Z8542 Personal history of malignant neoplasm of other parts of uterus: Secondary | ICD-10-CM

## 2015-04-21 DIAGNOSIS — Z5111 Encounter for antineoplastic chemotherapy: Secondary | ICD-10-CM | POA: Diagnosis not present

## 2015-04-21 DIAGNOSIS — C541 Malignant neoplasm of endometrium: Secondary | ICD-10-CM | POA: Diagnosis not present

## 2015-04-21 DIAGNOSIS — Z9221 Personal history of antineoplastic chemotherapy: Secondary | ICD-10-CM

## 2015-04-21 DIAGNOSIS — C770 Secondary and unspecified malignant neoplasm of lymph nodes of head, face and neck: Secondary | ICD-10-CM

## 2015-04-21 DIAGNOSIS — R590 Localized enlarged lymph nodes: Secondary | ICD-10-CM

## 2015-04-21 DIAGNOSIS — C55 Malignant neoplasm of uterus, part unspecified: Secondary | ICD-10-CM

## 2015-04-21 DIAGNOSIS — Z79899 Other long term (current) drug therapy: Secondary | ICD-10-CM

## 2015-04-21 LAB — CBC WITH DIFFERENTIAL/PLATELET
BASOS ABS: 0 10*3/uL (ref 0–0.1)
Basophils Relative: 1 %
Eosinophils Absolute: 0.1 10*3/uL (ref 0–0.7)
Eosinophils Relative: 1 %
HCT: 23.9 % — ABNORMAL LOW (ref 35.0–47.0)
Hemoglobin: 7.7 g/dL — ABNORMAL LOW (ref 12.0–16.0)
LYMPHS ABS: 0.5 10*3/uL — AB (ref 1.0–3.6)
Lymphocytes Relative: 8 %
MCH: 27.9 pg (ref 26.0–34.0)
MCHC: 32.1 g/dL (ref 32.0–36.0)
MCV: 86.9 fL (ref 80.0–100.0)
Monocytes Absolute: 0.6 10*3/uL (ref 0.2–0.9)
Monocytes Relative: 9 %
Neutro Abs: 5.2 10*3/uL (ref 1.4–6.5)
Neutrophils Relative %: 81 %
PLATELETS: 242 10*3/uL (ref 150–440)
RBC: 2.75 MIL/uL — AB (ref 3.80–5.20)
RDW: 16.1 % — AB (ref 11.5–14.5)
WBC: 6.4 10*3/uL (ref 3.6–11.0)

## 2015-04-21 LAB — COMPREHENSIVE METABOLIC PANEL
ALT: 17 U/L (ref 14–54)
ANION GAP: 9 (ref 5–15)
AST: 18 U/L (ref 15–41)
Albumin: 2.8 g/dL — ABNORMAL LOW (ref 3.5–5.0)
Alkaline Phosphatase: 159 U/L — ABNORMAL HIGH (ref 38–126)
BUN: 19 mg/dL (ref 6–20)
CALCIUM: 8.6 mg/dL — AB (ref 8.9–10.3)
CO2: 28 mmol/L (ref 22–32)
CREATININE: 1.03 mg/dL — AB (ref 0.44–1.00)
Chloride: 93 mmol/L — ABNORMAL LOW (ref 101–111)
GFR calc Af Amer: >60 mL/min (ref >60)
GFR calc non Af Amer: 55 mL/min — ABNORMAL LOW (ref >60)
Glucose, Bld: 93 mg/dL (ref 65–99)
Potassium: 3.9 mmol/L (ref 3.5–5.1)
Sodium: 130 mmol/L — ABNORMAL LOW (ref 135–145)
Total Bilirubin: 0.5 mg/dL (ref 0.3–1.2)
Total Protein: 6.6 g/dL (ref 6.5–8.1)

## 2015-04-21 MED ORDER — LIDOCAINE-PRILOCAINE 2.5-2.5 % EX CREA: TOPICAL_CREAM | CUTANEOUS | Status: DC

## 2015-04-21 MED ORDER — SODIUM CHLORIDE 0.9 % IV SOLN
Freq: Once | INTRAVENOUS | Status: AC
  Administered 2015-04-21: 11:00:00 via INTRAVENOUS
  Filled 2015-04-21: qty 250

## 2015-04-21 MED ORDER — HEPARIN SOD (PORK) LOCK FLUSH 100 UNIT/ML IV SOLN
500.0000 [IU] | Freq: Once | INTRAVENOUS | Status: AC | PRN
Start: 2015-04-21 — End: 2015-04-21
  Administered 2015-04-21: 500 [IU]
  Filled 2015-04-21: qty 5

## 2015-04-21 MED ORDER — SODIUM CHLORIDE 0.9 % IV SOLN
180.0000 mg | Freq: Once | INTRAVENOUS | Status: AC
  Administered 2015-04-21: 180 mg via INTRAVENOUS
  Filled 2015-04-21: qty 14

## 2015-04-21 MED ORDER — SODIUM CHLORIDE 0.9 % IV SOLN: 3.0000 mg/kg | Freq: Once | INTRAVENOUS | Status: DC

## 2015-04-21 MED ORDER — SODIUM CHLORIDE 0.9 % IJ SOLN
10.0000 mL | INTRAMUSCULAR | Status: DC | PRN
  Administered 2015-04-21: 10 mL
  Filled 2015-04-21: qty 10

## 2015-04-21 NOTE — Progress Notes (Signed)
[place] @ [facility] Telephone:(336) [phone]  Fax:([phone]     Somaly Marteney OB: August 27, 1951  MR#: [id number]  CWC#:[id number]  [name], MD as PCP - General (Family Medicine) [name], MD (General Surgery)  CHIEF COMPLAINT:  Chief Complaint  Patient presents with  . Follow-up    Oncology History   Chief Complaint/Diagnosis:   The patient is a 64 year old with metastatic endometrial cancer who is seen for assessment during ongoing palliative radiation therapy.  04/2011   papillary serous adenocarcinoma of the endometrium, stage IIIb (nodal metastasis)              TAHBSO and full staging,               GOG protocl using XRT and chemotherapy, significant XRT side-effects,               continued on chemotherapy off protocol, 04/2013   recurrence in the nodal areas,              DDP and docetaxel x 6 completed in December,   started on chemotherapy with Adriamycin(October, 2015) poor tolerance to letrozole  and affinator Estrogen and progesterone receptor negative tumor         hER-2/neu recetor negative. November 07, 2014 progressive disease by  tumor marker criteria and clinical examination November 12, 2014 After 6 cycles of chemotherapy because of increasing side effect chemotherapy was discontinued.  (March, 2016) Patient was started on cis-platinum and topotecan. May, 2016 Cis-platinum into Portec and was discontinued because of significant side effect and possibility of progressive disease.  Patient would be started on NIVOLULAMAB on compassionate use basis     Cancer of uterus   04/18/2015 Initial Diagnosis Cancer of uterus    Oncology Flowsheet 04/21/2015  nivolumab (OPDIVO) IV 180 mg    INTERVAL HISTORY: 64 year old lady came today with progressing endometrial cancer.  Increasing left supraclavicular lymph node.  Increasing pain after increasing fentanyl patch pain is somewhat better.  Patient is very depressed  REVIEW  OF SYSTEMS:   Gen. status: Patient is feeling weak and tired no chills no fever.  HEENT: Left supraclavicular lymph node has increased in size.  GI: No nausea.  No vomiting.  No diarrhea.  Musculoskeletal system increasing back pain had a bone scan done.  Cardiac: No chest pain lungs: No shortness of breath lower extremity no edema.  Skin: No rash.  Neurological system no headache no dizziness.  No other focal signs  As per HPI. Otherwise, a complete review of systems is negatve.  PAST MEDICAL HISTORY: Past Medical History  Diagnosis Date  . Cancer 2012  . GI problem 2013  . Cancer of uterus 04/18/2015    PAST SURGICAL HISTORY: Past Surgical History  Procedure Laterality Date  . Abdominal hysterectomy      FAMILY HISTORY  GYNECOLOGIC HISTORY:  No LMP recorded. Patient has had a hysterectomy.     ADVANCED DIRECTIVES:    HEALTH MAINTENANCE: History  Substance Use Topics  . Smoking status: Never Smoker   . Smokeless tobacco: Never Used  . Alcohol Use: Yes     Colonoscopy:  PAP:  Bone density:  Lipid panel:  Allergies  Allergen Reactions  . Penicillins Rash    Current Outpatient Prescriptions  Medication Sig Dispense Refill  . ALPRAZolam (XANAX) 0.25 MG tablet     . bismuth subsalicylate (PEPTO BISMOL) 262 MG chewable tablet Chew 524 mg by mouth as needed for indigestion.    Marland Kitchen  fentaNYL (DURAGESIC - DOSED MCG/HR) 25 MCG/HR patch Place 1 patch (25 mcg total) onto the skin every 3 (three) days. Use with fentanyl patch 76mg for total 722m. 7 patch 0  . fentaNYL (DURAGESIC - DOSED MCG/HR) 50 MCG/HR Place 1 patch (50 mcg total) onto the skin every 3 (three) days. 10 patch 0  . fexofenadine (ALLEGRA) 180 MG tablet Take 180 mg by mouth daily.    . fluticasone (FLONASE) 50 MCG/ACT nasal spray Place 1-2 sprays into the nose daily as needed.    . ondansetron (ZOFRAN) 4 MG tablet Take 1 tablet by mouth every 4 (four) hours as needed.    . Marland Kitchenxybutynin (DITROPAN-XL) 10 MG 24 hr  tablet Take 10 mg by mouth daily.    . polyethylene glycol powder (GLYCOLAX/MIRALAX) powder Take by mouth.    . potassium chloride (K-DUR) 10 MEQ tablet Take 1 tablet (10 mEq total) by mouth daily. 30 tablet 3  . predniSONE (DELTASONE) 20 MG tablet Take 1 tablet (20 mg total) by mouth daily with breakfast. 30 tablet 0  . ranitidine (ZANTAC) 150 MG tablet Take 1 tablet by mouth 2 (two) times daily.    . sertraline (ZOLOFT) 50 MG tablet Take 1 tablet by mouth daily.    . cholestyramine (QUESTRAN) 4 G packet Take 1 packet by mouth 3 (three) times daily as needed.     . lidocaine-prilocaine (EMLA) cream Apply to affected area once 30 g 3  . PENTASA 500 MG CR capsule Take 2 capsules by mouth 4 (four) times daily.     No current facility-administered medications for this visit.   Facility-Administered Medications Ordered in Other Visits  Medication Dose Route Frequency Provider Last Rate Last Dose  . heparin lock flush 100 unit/mL  500 Units Intracatheter Once PRN JaForest GleasonMD      . nivolumab (OPDIVO) 180 mg in sodium chloride 0.9 % 100 mL chemo infusion  180 mg Intravenous Once JaForest GleasonMD 118 mL/hr at 04/21/15 1144 180 mg at 04/21/15 1144  . sodium chloride 0.9 % injection 10 mL  10 mL Intracatheter PRN JaForest GleasonMD   10 mL at 04/21/15 0915    OBJECTIVE:  Filed Vitals:   04/21/15 0957  BP: 125/73  Pulse: 77     Body mass index is 24.49 kg/(m^2).    ECOG FS:1 - Symptomatic but completely ambulatory  PHYSICAL EXAM: Gen. status: Alert and oriented lady not any acute distress Lymphatic system: Palpable left supraclavicular lymph node has increased in size lungs: Occasional rhonchiGENERAL:  Well developed, well nourished, sitting comfortably in th exam room in no acute distress.  Abdomen: Soft.  Liver and spleen not palpable.  No ascites.  Neurological system: Higher functions within normal limit.  And nose are intact.  No other focal signs.  Musculoskeletal system no  tenderness.  LAB RESULTS:  Infusion on 04/21/2015  Component Date Value Ref Range Status  . WBC 04/21/2015 6.4  3.6 - 11.0 K/uL Final   A-LINE DRAW  . RBC 04/21/2015 2.75* 3.80 - 5.20 MIL/uL Final  . Hemoglobin 04/21/2015 7.7* 12.0 - 16.0 g/dL Final  . HCT 04/21/2015 23.9* 35.0 - 47.0 % Final  . MCV 04/21/2015 86.9  80.0 - 100.0 fL Final  . MCH 04/21/2015 27.9  26.0 - 34.0 pg Final  . MCHC 04/21/2015 32.1  32.0 - 36.0 g/dL Final  . RDW 04/21/2015 16.1* 11.5 - 14.5 % Final  . Platelets 04/21/2015 242  150 - 440 K/uL Final  .  Neutrophils Relative % 04/21/2015 81   Final  . Neutro Abs 04/21/2015 5.2  1.4 - 6.5 K/uL Final  . Lymphocytes Relative 04/21/2015 8   Final  . Lymphs Abs 04/21/2015 0.5* 1.0 - 3.6 K/uL Final  . Monocytes Relative 04/21/2015 9   Final  . Monocytes Absolute 04/21/2015 0.6  0.2 - 0.9 K/uL Final  . Eosinophils Relative 04/21/2015 1   Final  . Eosinophils Absolute 04/21/2015 0.1  0 - 0.7 K/uL Final  . Basophils Relative 04/21/2015 1   Final  . Basophils Absolute 04/21/2015 0.0  0 - 0.1 K/uL Final  . Sodium 04/21/2015 130* 135 - 145 mmol/L Final  . Potassium 04/21/2015 3.9  3.5 - 5.1 mmol/L Final  . Chloride 04/21/2015 93* 101 - 111 mmol/L Final  . CO2 04/21/2015 28  22 - 32 mmol/L Final  . Glucose, Bld 04/21/2015 93  65 - 99 mg/dL Final  . BUN 04/21/2015 19  6 - 20 mg/dL Final  . Creatinine, Ser 04/21/2015 1.03* 0.44 - 1.00 mg/dL Final  . Calcium 04/21/2015 8.6* 8.9 - 10.3 mg/dL Final  . Total Protein 04/21/2015 6.6  6.5 - 8.1 g/dL Final  . Albumin 04/21/2015 2.8* 3.5 - 5.0 g/dL Final  . AST 04/21/2015 18  15 - 41 U/L Final  . ALT 04/21/2015 17  14 - 54 U/L Final  . Alkaline Phosphatase 04/21/2015 159* 38 - 126 U/L Final  . Total Bilirubin 04/21/2015 0.5  0.3 - 1.2 mg/dL Final  . GFR calc non Af Amer 04/21/2015 55* >60 mL/min Final  . GFR calc Af Amer 04/21/2015 >60  >60 mL/min Final   Comment: (NOTE) The eGFR has been calculated using the CKD EPI  equation. This calculation has not been validated in all clinical situations. eGFR's persistently <60 mL/min signify possible Chronic Kidney Disease.   . Anion gap 04/21/2015 9  5 - 15 Final    No results found for: LABCA2 No results found for: CA199 No results found for: CEA No results found for: PSA Lab Results  Component Value Date   CA125 18.8 07/23/2014     STUDIES: Nm Bone Scan Whole Body  04/18/2015   CLINICAL DATA:  Low back pain and history of endometrial cancer. Left hip pain 6 months getting worse over the past week. Chemotherapy 7 weeks ago. Large mass left neck.  EXAM: NUCLEAR MEDICINE WHOLE BODY BONE SCAN  TECHNIQUE: Whole body anterior and posterior images were obtained approximately 3 hours after intravenous injection of radiopharmaceutical.  RADIOPHARMACEUTICALS:  22.49 mCi Technetium-39mMDP IV  COMPARISON:  PET-CT 07/31/2014 as well as CT chest, abdomen and pelvis 02/18/2015  FINDINGS: Normal biodistribution of radiotracer uptake within the bones, soft tissues and genitourinary system. No focal osseous uptake of radiotracer to suggest metastatic disease.  IMPRESSION: No evidence of osseous metastatic disease.   Electronically Signed   By: DMarin OlpM.D.   On: 04/18/2015 14:51   [place] ([facility] Hx)  04/02/2015   CLINICAL DATA:  Left flank pain  EXAM: RENAL / URINARY TRACT ULTRASOUND COMPLETE  COMPARISON:  CT abdomen pelvis dated 02/18/2015  FINDINGS: Right Kidney:  Length: 9.4 cm.  No mass or hydronephrosis.  Left Kidney:  Length: 9.3 cm.  No mass or hydronephrosis.  Bladder:  Within normal limits.  IMPRESSION: Negative renal ultrasound.   Electronically Signed   By: SJulian HyM.D.   On: 04/02/2015 11:25    ASSESSMENT: Carcinoma of endometrium progressive disease Back pain is somewhat better after  increasing fentanyl patch  MEDICAL DECISION MAKING:  Bone scan has been reviewed no evidence of metastases to the bone was found.  Most likely back pain is  coming because of enlarged retroperitoneal lymph node Possibility of adding more radiation therapy to the left neck area should be considered Initiate NIVOLULAMAB informed consent has been obtained. Intent of chemotherapy is palliation and relief in symptoms and extending survival All the side effects of chemotherapy including myelosuppression, alopecia, nausea vomiting fatigue weakness.  Secondary infection, and   peripheral neuropathy .  Has been discussed in details. Informal consent has been obtained and will be documented by nurses in the chart Patient expressed understanding and was in agreement with this plan. She also understands that She can call clinic at any time with any questions, concerns, or complaints.    Cancer of uterus   Staging form: Corpus Uteri - Carcinoma, AJCC 7th Edition     Clinical: Stage IVB (T3b, N2, M1) - Signed by Forest Gleason, MD on 04/18/2015   Forest Gleason, MD   04/21/2015 11:55 AM

## 2015-04-23 ENCOUNTER — Other Ambulatory Visit: Payer: Self-pay

## 2015-04-23 ENCOUNTER — Ambulatory Visit: Payer: Self-pay

## 2015-04-23 ENCOUNTER — Ambulatory Visit: Payer: Self-pay | Admitting: Oncology

## 2015-04-24 ENCOUNTER — Telehealth: Payer: Self-pay | Admitting: *Deleted

## 2015-04-24 NOTE — Telephone Encounter (Signed)
Patient requesting refill for Hydrocodone.  Patient going out of town and does not want to run out.  Also states her side is still bothering her, especially in the morning and wants to know of MD thinks she could try acupuncture?

## 2015-04-25 MED ORDER — HYDROCODONE-ACETAMINOPHEN 5-325 MG PO TABS: 1.0000 | ORAL_TABLET | Freq: Four times a day (QID) | ORAL | Status: DC | PRN

## 2015-04-25 NOTE — Telephone Encounter (Signed)
Informed that prescription is ready to pick up  

## 2015-04-30 ENCOUNTER — Other Ambulatory Visit: Payer: Self-pay | Admitting: Family Medicine

## 2015-05-01 ENCOUNTER — Other Ambulatory Visit: Payer: Self-pay | Admitting: Oncology

## 2015-05-01 ENCOUNTER — Encounter: Payer: Self-pay | Admitting: *Deleted

## 2015-05-06 ENCOUNTER — Inpatient Hospital Stay: Payer: Medicare Other

## 2015-05-06 ENCOUNTER — Inpatient Hospital Stay (HOSPITAL_BASED_OUTPATIENT_CLINIC_OR_DEPARTMENT_OTHER): Payer: Medicare Other | Admitting: Oncology

## 2015-05-06 VITALS — BP 104/66 | HR 80 | Temp 98.0°F | Resp 18 | Ht 63.0 in | Wt 137.0 lb

## 2015-05-06 DIAGNOSIS — C770 Secondary and unspecified malignant neoplasm of lymph nodes of head, face and neck: Secondary | ICD-10-CM | POA: Diagnosis not present

## 2015-05-06 DIAGNOSIS — C541 Malignant neoplasm of endometrium: Secondary | ICD-10-CM

## 2015-05-06 DIAGNOSIS — C55 Malignant neoplasm of uterus, part unspecified: Secondary | ICD-10-CM

## 2015-05-06 DIAGNOSIS — R590 Localized enlarged lymph nodes: Secondary | ICD-10-CM | POA: Diagnosis not present

## 2015-05-06 DIAGNOSIS — M545 Low back pain: Secondary | ICD-10-CM

## 2015-05-06 DIAGNOSIS — Z5111 Encounter for antineoplastic chemotherapy: Secondary | ICD-10-CM | POA: Diagnosis not present

## 2015-05-06 DIAGNOSIS — Z8542 Personal history of malignant neoplasm of other parts of uterus: Secondary | ICD-10-CM

## 2015-05-06 DIAGNOSIS — Z79899 Other long term (current) drug therapy: Secondary | ICD-10-CM

## 2015-05-06 DIAGNOSIS — R109 Unspecified abdominal pain: Secondary | ICD-10-CM

## 2015-05-06 LAB — CBC WITH DIFFERENTIAL/PLATELET
Basophils Absolute: 0 10*3/uL (ref 0–0.1)
Basophils Relative: 1 %
Eosinophils Absolute: 0.1 10*3/uL (ref 0–0.7)
Eosinophils Relative: 2 %
HEMATOCRIT: 24.6 % — AB (ref 35.0–47.0)
Hemoglobin: 7.7 g/dL — ABNORMAL LOW (ref 12.0–16.0)
LYMPHS PCT: 6 %
Lymphs Abs: 0.3 10*3/uL — ABNORMAL LOW (ref 1.0–3.6)
MCH: 27.1 pg (ref 26.0–34.0)
MCHC: 31.6 g/dL — ABNORMAL LOW (ref 32.0–36.0)
MCV: 85.8 fL (ref 80.0–100.0)
MONO ABS: 0.4 10*3/uL (ref 0.2–0.9)
Monocytes Relative: 9 %
NEUTROS ABS: 4.3 10*3/uL (ref 1.4–6.5)
Neutrophils Relative %: 84 %
Platelets: 247 10*3/uL (ref 150–440)
RBC: 2.86 MIL/uL — ABNORMAL LOW (ref 3.80–5.20)
RDW: 16.3 % — AB (ref 11.5–14.5)
WBC: 5.1 10*3/uL (ref 3.6–11.0)

## 2015-05-06 LAB — COMPREHENSIVE METABOLIC PANEL
ALT: 15 U/L (ref 14–54)
AST: 16 U/L (ref 15–41)
Albumin: 2.7 g/dL — ABNORMAL LOW (ref 3.5–5.0)
Alkaline Phosphatase: 128 U/L — ABNORMAL HIGH (ref 38–126)
Anion gap: 8 (ref 5–15)
BUN: 19 mg/dL (ref 6–20)
CALCIUM: 8.4 mg/dL — AB (ref 8.9–10.3)
CO2: 27 mmol/L (ref 22–32)
Chloride: 95 mmol/L — ABNORMAL LOW (ref 101–111)
Creatinine, Ser: 0.88 mg/dL (ref 0.44–1.00)
GFR calc Af Amer: >60 mL/min (ref >60)
GFR calc non Af Amer: >60 mL/min (ref >60)
Glucose, Bld: 139 mg/dL — ABNORMAL HIGH (ref 65–99)
Potassium: 3.6 mmol/L (ref 3.5–5.1)
SODIUM: 130 mmol/L — AB (ref 135–145)
Total Bilirubin: 0.3 mg/dL (ref 0.3–1.2)
Total Protein: 6.5 g/dL (ref 6.5–8.1)

## 2015-05-06 MED ORDER — SODIUM CHLORIDE 0.9 % IV SOLN
Freq: Once | INTRAVENOUS | Status: AC
  Administered 2015-05-06: 11:00:00 via INTRAVENOUS
  Filled 2015-05-06: qty 1000

## 2015-05-06 MED ORDER — SODIUM CHLORIDE 0.9 % IV SOLN
180.0000 mg | Freq: Once | INTRAVENOUS | Status: AC
  Administered 2015-05-06: 180 mg via INTRAVENOUS
  Filled 2015-05-06: qty 14

## 2015-05-06 MED ORDER — FENTANYL 75 MCG/HR TD PT72: 75.0000 ug | MEDICATED_PATCH | TRANSDERMAL | Status: DC

## 2015-05-06 MED ORDER — HEPARIN SOD (PORK) LOCK FLUSH 100 UNIT/ML IV SOLN
500.0000 [IU] | Freq: Once | INTRAVENOUS | Status: AC | PRN
  Administered 2015-05-06: 500 [IU]
  Filled 2015-05-06: qty 5

## 2015-05-06 MED ORDER — SODIUM CHLORIDE 0.9 % IJ SOLN
10.0000 mL | INTRAMUSCULAR | Status: AC | PRN
  Administered 2015-05-06: 10 mL
  Filled 2015-05-06: qty 10

## 2015-05-07 ENCOUNTER — Encounter: Payer: Self-pay | Admitting: Oncology

## 2015-05-07 ENCOUNTER — Other Ambulatory Visit: Payer: Self-pay | Admitting: Oncology

## 2015-05-07 NOTE — Progress Notes (Signed)
[place] @ [facility] Telephone:([phone]  Fax:([phone]     [name] Graham OB: [birth year]  MR#: [id number]  QMV#:[id number]  [name], MD as PCP - General (Family Medicine) [name], MD (General Surgery)  CHIEF COMPLAINT:  Chief Complaint  Patient presents with  . Follow-up    here for nivolumab treatment.    Oncology History   Chief Complaint/Diagnosis:   The patient is a 63 year old with metastatic endometrial cancer who is seen for assessment during ongoing palliative radiation therapy.  04/2011   papillary serous adenocarcinoma of the endometrium, stage IIIb (nodal metastasis)              TAHBSO and full staging,               GOG protocl using XRT and chemotherapy, significant XRT side-effects,               continued on chemotherapy off protocol, 04/2013   recurrence in the nodal areas,              DDP and docetaxel x 6 completed in December,   started on chemotherapy with Adriamycin(October, 2015) poor tolerance to letrozole  and affinator Estrogen and progesterone receptor negative tumor         hER-2/neu recetor negative. November 07, 2014 progressive disease by  tumor marker criteria and clinical examination November 12, 2014 After 6 cycles of chemotherapy because of increasing side effect chemotherapy was discontinued.  (March, 2016) Patient was started on cis-platinum and topotecan. May, 2016 Cis-platinum into Portec and was discontinued because of significant side effect and possibility of progressive disease.  Patient would be started on NIVOLULAMAB on compassionate use basis     Cancer of uterus   04/18/2015 Initial Diagnosis Cancer of uterus    Oncology Flowsheet 04/21/2015 05/06/2015  nivolumab (OPDIVO) IV 180 mg 180 mg    INTERVAL HISTORY: 64 year old lady came today with progressing endometrial cancer.  Increasing left supraclavicular lymph node.  Increasing pain after increasing fentanyl patch pain is  somewhat better.  Patient is very depressed May 31.  2016 Patient is here for continuation of NIVOLULAMAB, tolerated first treatment very well without any significant problem.  No diarrhea.  No rash.  After increasing fentanyl patch pain is improved REVIEW OF SYSTEMS:   Gen. status: Patient is feeling weak and tired no chills no fever.  HEENT: Left supraclavicular lymph node has increased in size.  GI: No nausea.  No vomiting.  No diarrhea.  Musculoskeletal system increasing back pain had a bone scan done.  Cardiac: No chest pain lungs: No shortness of breath lower extremity no edema.  Skin: No rash.  Neurological system no headache no dizziness.  No other focal signs May 06, 2015 Left supraclavicular swelling is stable.  Patient's pain is improved after increasing fentanyl patch As per HPI. Otherwise, a complete review of systems is negatve.  PAST MEDICAL HISTORY: Past Medical History  Diagnosis Date  . Cancer 2012  . GI problem 2013  . Cancer of uterus 04/18/2015    PAST SURGICAL HISTORY: Past Surgical History  Procedure Laterality Date  . Abdominal hysterectomy      FAMILY HISTORY No significant family history. GYNECOLOGIC HISTORY:  No LMP recorded. Patient has had a hysterectomy.     ADVANCED DIRECTIVES: Patient does have advance directive   HEALTH MAINTENANCE: History  Substance Use Topics  . Smoking status: Never Smoker   . Smokeless tobacco: Never Used  .  Alcohol Use: Yes      Allergies  Allergen Reactions  . Penicillins Rash    Current Outpatient Prescriptions  Medication Sig Dispense Refill  . ALPRAZolam (XANAX) 0.25 MG tablet Take 0.25 mg by mouth 2 (two) times daily as needed for anxiety.     . bismuth subsalicylate (PEPTO BISMOL) 262 MG chewable tablet Chew 524 mg by mouth as needed for indigestion.     . fexofenadine (ALLEGRA) 180 MG tablet Take 180 mg by mouth as needed.     . fluticasone (FLONASE) 50 MCG/ACT nasal spray Place 1-2 sprays into the nose  daily as needed.    Marland Kitchen HYDROcodone-acetaminophen (NORCO/VICODIN) 5-325 MG per tablet Take 1 tablet by mouth every 6 (six) hours as needed for moderate pain. 60 tablet 0  . metoCLOPramide (REGLAN) 10 MG tablet Take 10 mg by mouth 4 (four) times daily.    . ondansetron (ZOFRAN) 4 MG tablet TAKE 1 TABLET BY MOUTH EVERY 6 HOURS AS NEEDED 60 tablet 0  . polyethylene glycol powder (GLYCOLAX/MIRALAX) powder Take by mouth.    . potassium chloride (K-DUR) 10 MEQ tablet Take 1 tablet (10 mEq total) by mouth daily. 30 tablet 3  . predniSONE (DELTASONE) 20 MG tablet Take 1 tablet (20 mg total) by mouth daily with breakfast. 30 tablet 0  . ranitidine (ZANTAC) 150 MG tablet Take 1 tablet by mouth 2 (two) times daily.    . sertraline (ZOLOFT) 50 MG tablet Take 1 tablet by mouth daily.    . cholestyramine (QUESTRAN) 4 G packet Take 1 packet by mouth 3 (three) times daily as needed.     . fentaNYL (DURAGESIC - DOSED MCG/HR) 25 MCG/HR patch     . fentaNYL (DURAGESIC - DOSED MCG/HR) 50 MCG/HR     . fentaNYL (DURAGESIC - DOSED MCG/HR) 75 MCG/HR Place 1 patch (75 mcg total) onto the skin every 3 (three) days. 10 patch 0  . lidocaine-prilocaine (EMLA) cream Apply to affected area once (Patient not taking: Reported on 05/06/2015) 30 g 3   No current facility-administered medications for this visit.   Facility-Administered Medications Ordered in Other Visits  Medication Dose Route Frequency Provider Last Rate Last Dose  . sodium chloride 0.9 % injection 10 mL  10 mL Intracatheter PRN Forest Gleason, MD   10 mL at 05/06/15 1028    OBJECTIVE:  Filed Vitals:   05/06/15 1028  BP: 104/66  Pulse: 80  Temp: 98 F (36.7 C)  Resp: 18     Body mass index is 24.27 kg/(m^2).    ECOG FS:1 - Symptomatic but completely ambulatory  PHYSICAL EXAM: Gen. status: Alert and oriented lady not any acute distress Lymphatic system: Palpable left supraclavicular lymph node has increased in size lungs: Occasional rhonchiGENERAL:  Well  developed, well nourished, sitting comfortably in th exam room in no acute distress.  Abdomen: Soft.  Liver and spleen not palpable.  No ascites.  NNeurologically, the patient was awake, alert, and oriented to person, place and time. There were no obvious focal neurologic abnormalities. Examination of the skin revealed no evidence of significant rashes, suspicious appearing nevi or other concerning lesions. Cardiac exam revealed the PMI to be normally situated and sized. The rhythm was regular and no extrasystoles were noted during several minutes of auscultation. The first and second heart sounds were normal and physiologic splitting of the second heart sound was noted. There were no murmurs, rubs, clicks, or gallops. LAB RESULTS:  Infusion on 05/06/2015  Component Date Value Ref  Range Status  . Sodium 05/06/2015 130* 135 - 145 mmol/L Final  . Potassium 05/06/2015 3.6  3.5 - 5.1 mmol/L Final  . Chloride 05/06/2015 95* 101 - 111 mmol/L Final  . CO2 05/06/2015 27  22 - 32 mmol/L Final  . Glucose, Bld 05/06/2015 139* 65 - 99 mg/dL Final  . BUN 05/06/2015 19  6 - 20 mg/dL Final  . Creatinine, Ser 05/06/2015 0.88  0.44 - 1.00 mg/dL Final  . Calcium 05/06/2015 8.4* 8.9 - 10.3 mg/dL Final  . Total Protein 05/06/2015 6.5  6.5 - 8.1 g/dL Final  . Albumin 05/06/2015 2.7* 3.5 - 5.0 g/dL Final  . AST 05/06/2015 16  15 - 41 U/L Final  . ALT 05/06/2015 15  14 - 54 U/L Final  . Alkaline Phosphatase 05/06/2015 128* 38 - 126 U/L Final  . Total Bilirubin 05/06/2015 0.3  0.3 - 1.2 mg/dL Final  . GFR calc non Af Amer 05/06/2015 >60  >60 mL/min Final  . GFR calc Af Amer 05/06/2015 >60  >60 mL/min Final   Comment: (NOTE) The eGFR has been calculated using the CKD EPI equation. This calculation has not been validated in all clinical situations. eGFR's persistently <60 mL/min signify possible Chronic Kidney Disease.   . Anion gap 05/06/2015 8  5 - 15 Final  . WBC 05/06/2015 5.1  3.6 - 11.0 K/uL Final    A-LINE DRAW  . RBC 05/06/2015 2.86* 3.80 - 5.20 MIL/uL Final  . Hemoglobin 05/06/2015 7.7* 12.0 - 16.0 g/dL Final  . HCT 05/06/2015 24.6* 35.0 - 47.0 % Final  . MCV 05/06/2015 85.8  80.0 - 100.0 fL Final  . MCH 05/06/2015 27.1  26.0 - 34.0 pg Final  . MCHC 05/06/2015 31.6* 32.0 - 36.0 g/dL Final  . RDW 05/06/2015 16.3* 11.5 - 14.5 % Final  . Platelets 05/06/2015 247  150 - 440 K/uL Final  . Neutrophils Relative % 05/06/2015 84   Final  . Neutro Abs 05/06/2015 4.3  1.4 - 6.5 K/uL Final  . Lymphocytes Relative 05/06/2015 6   Final  . Lymphs Abs 05/06/2015 0.3* 1.0 - 3.6 K/uL Final  . Monocytes Relative 05/06/2015 9   Final  . Monocytes Absolute 05/06/2015 0.4  0.2 - 0.9 K/uL Final  . Eosinophils Relative 05/06/2015 2   Final  . Eosinophils Absolute 05/06/2015 0.1  0 - 0.7 K/uL Final  . Basophils Relative 05/06/2015 1   Final  . Basophils Absolute 05/06/2015 0.0  0 - 0.1 K/uL Final    Lab Results  Component Value Date   CA125 18.8 07/23/2014     STUDIES: Nm Bone Scan Whole Body  04/18/2015   CLINICAL DATA:  Low back pain and history of endometrial cancer. Left hip pain 6 months getting worse over the past week. Chemotherapy 7 weeks ago. Large mass left neck.  EXAM: NUCLEAR MEDICINE WHOLE BODY BONE SCAN  TECHNIQUE: Whole body anterior and posterior images were obtained approximately 3 hours after intravenous injection of radiopharmaceutical.  RADIOPHARMACEUTICALS:  22.49 mCi Technetium-20mMDP IV  COMPARISON:  PET-CT 07/31/2014 as well as CT chest, abdomen and pelvis 02/18/2015  FINDINGS: Normal biodistribution of radiotracer uptake within the bones, soft tissues and genitourinary system. No focal osseous uptake of radiotracer to suggest metastatic disease.  IMPRESSION: No evidence of osseous metastatic disease.   Electronically Signed   By: DMarin OlpM.D.   On: 04/18/2015 14:51    ASSESSMENT: Carcinoma of endometrium progressive disease Back pain is somewhat better after increasing  fentanyl patch Bone scan has been reviewed there is no evidence of progressive disease or bone metastases  MEDICAL DECISION MAKING:  Bone scan has been reviewed no evidence of metastases to the bone was found.  Most likely back pain is coming because of enlarged retroperitoneal lymph node Possibility of adding more radiation therapy to the left neck area should be considered Initiate NIVOLULAMAB informed consent has been obtained. Intent of chemotherapy is palliation and relief in symptoms and extending survival All the side effects of chemotherapy including myelosuppression, alopecia, nausea vomiting fatigue weakness.  Secondary infection, and   peripheral neuropathy .  Has been discussed in details. Informal consent has been obtained and will be documented by nurses in the chart  [place] Patient expressed understanding and was in agreement with this plan. She also understands that She can call clinic at any time with any questions, concerns, or complaints.    Cancer of uterus   Staging form: Corpus Uteri - Carcinoma, AJCC 7th Edition     Clinical: Stage IVB (T3b, N2, M1) - Signed by Forest Gleason, MD on 04/18/2015   Forest Gleason, MD   05/07/2015 5:09 PM

## 2015-05-14 ENCOUNTER — Other Ambulatory Visit: Payer: Self-pay | Admitting: Oncology

## 2015-05-15 ENCOUNTER — Telehealth: Payer: Self-pay | Admitting: *Deleted

## 2015-05-15 DIAGNOSIS — C55 Malignant neoplasm of uterus, part unspecified: Secondary | ICD-10-CM

## 2015-05-15 MED ORDER — PREDNISONE 10 MG PO TABS
ORAL_TABLET | ORAL | Status: DC
Start: 2015-05-15 — End: 2015-08-12

## 2015-05-15 NOTE — Telephone Encounter (Signed)
Pt requested refill for prednisone. Per Dr. Mike Gip will start to taper patient off prednisone at this time. New Rx called into pharmacy.

## 2015-05-19 ENCOUNTER — Other Ambulatory Visit: Payer: Self-pay | Admitting: Oncology

## 2015-05-19 DIAGNOSIS — Z8542 Personal history of malignant neoplasm of other parts of uterus: Secondary | ICD-10-CM

## 2015-05-20 ENCOUNTER — Inpatient Hospital Stay: Payer: Medicare Other | Attending: Oncology

## 2015-05-20 VITALS — BP 108/64 | HR 77 | Temp 96.8°F | Resp 18

## 2015-05-20 DIAGNOSIS — C541 Malignant neoplasm of endometrium: Secondary | ICD-10-CM | POA: Diagnosis not present

## 2015-05-20 DIAGNOSIS — Z90710 Acquired absence of both cervix and uterus: Secondary | ICD-10-CM | POA: Insufficient documentation

## 2015-05-20 DIAGNOSIS — F329 Major depressive disorder, single episode, unspecified: Secondary | ICD-10-CM | POA: Diagnosis not present

## 2015-05-20 DIAGNOSIS — Z923 Personal history of irradiation: Secondary | ICD-10-CM | POA: Insufficient documentation

## 2015-05-20 DIAGNOSIS — C779 Secondary and unspecified malignant neoplasm of lymph node, unspecified: Secondary | ICD-10-CM | POA: Diagnosis not present

## 2015-05-20 DIAGNOSIS — Z8542 Personal history of malignant neoplasm of other parts of uterus: Secondary | ICD-10-CM

## 2015-05-20 DIAGNOSIS — Z79899 Other long term (current) drug therapy: Secondary | ICD-10-CM | POA: Insufficient documentation

## 2015-05-20 DIAGNOSIS — C55 Malignant neoplasm of uterus, part unspecified: Secondary | ICD-10-CM

## 2015-05-20 DIAGNOSIS — Z5111 Encounter for antineoplastic chemotherapy: Secondary | ICD-10-CM | POA: Diagnosis present

## 2015-05-20 LAB — CBC WITH DIFFERENTIAL/PLATELET
Basophils Absolute: 0 10*3/uL (ref 0–0.1)
Basophils Relative: 0 %
EOS ABS: 0.1 10*3/uL (ref 0–0.7)
EOS PCT: 1 %
HEMATOCRIT: 27.3 % — AB (ref 35.0–47.0)
Hemoglobin: 8.6 g/dL — ABNORMAL LOW (ref 12.0–16.0)
Lymphocytes Relative: 2 %
Lymphs Abs: 0.2 10*3/uL — ABNORMAL LOW (ref 1.0–3.6)
MCH: 26.2 pg (ref 26.0–34.0)
MCHC: 31.5 g/dL — AB (ref 32.0–36.0)
MCV: 83.1 fL (ref 80.0–100.0)
Monocytes Absolute: 0.4 10*3/uL (ref 0.2–0.9)
Monocytes Relative: 6 %
Neutro Abs: 6.9 10*3/uL — ABNORMAL HIGH (ref 1.4–6.5)
Neutrophils Relative %: 91 %
PLATELETS: 240 10*3/uL (ref 150–440)
RBC: 3.29 MIL/uL — ABNORMAL LOW (ref 3.80–5.20)
RDW: 16.2 % — AB (ref 11.5–14.5)
WBC: 7.6 10*3/uL (ref 3.6–11.0)

## 2015-05-20 LAB — BASIC METABOLIC PANEL
ANION GAP: 9 (ref 5–15)
BUN: 21 mg/dL — ABNORMAL HIGH (ref 6–20)
CALCIUM: 8.7 mg/dL — AB (ref 8.9–10.3)
CO2: 27 mmol/L (ref 22–32)
CREATININE: 1 mg/dL (ref 0.44–1.00)
Chloride: 93 mmol/L — ABNORMAL LOW (ref 101–111)
GFR calc Af Amer: >60 mL/min (ref >60)
GFR, EST NON AFRICAN AMERICAN: 57 mL/min — AB (ref >60)
Glucose, Bld: 109 mg/dL — ABNORMAL HIGH (ref 65–99)
Potassium: 4.2 mmol/L (ref 3.5–5.1)
SODIUM: 129 mmol/L — AB (ref 135–145)

## 2015-05-20 MED ORDER — HEPARIN SOD (PORK) LOCK FLUSH 100 UNIT/ML IV SOLN
500.0000 [IU] | Freq: Once | INTRAVENOUS | Status: AC | PRN
  Administered 2015-05-20: 500 [IU]
  Filled 2015-05-20: qty 5

## 2015-05-20 MED ORDER — SODIUM CHLORIDE 0.9 % IV SOLN
Freq: Once | INTRAVENOUS | Status: AC
  Administered 2015-05-20: 11:00:00 via INTRAVENOUS
  Filled 2015-05-20: qty 1000

## 2015-05-20 MED ORDER — SODIUM CHLORIDE 0.9 % IJ SOLN
10.0000 mL | INTRAMUSCULAR | Status: DC | PRN
  Administered 2015-05-20: 10 mL
  Filled 2015-05-20: qty 10

## 2015-05-20 MED ORDER — SODIUM CHLORIDE 0.9 % IV SOLN
180.0000 mg | Freq: Once | INTRAVENOUS | Status: DC
  Administered 2015-05-20: 180 mg via INTRAVENOUS
  Filled 2015-05-20: qty 18

## 2015-05-20 MED ORDER — SODIUM CHLORIDE 0.9 % IV SOLN: 180.0000 mg | Freq: Once | INTRAVENOUS | Status: DC

## 2015-06-03 ENCOUNTER — Inpatient Hospital Stay (HOSPITAL_BASED_OUTPATIENT_CLINIC_OR_DEPARTMENT_OTHER): Payer: Medicare Other | Admitting: Oncology

## 2015-06-03 ENCOUNTER — Inpatient Hospital Stay: Payer: Medicare Other

## 2015-06-03 ENCOUNTER — Encounter: Payer: Self-pay | Admitting: Oncology

## 2015-06-03 VITALS — BP 109/67 | HR 98 | Temp 97.1°F | Wt 143.3 lb

## 2015-06-03 DIAGNOSIS — R221 Localized swelling, mass and lump, neck: Secondary | ICD-10-CM

## 2015-06-03 DIAGNOSIS — C55 Malignant neoplasm of uterus, part unspecified: Secondary | ICD-10-CM

## 2015-06-03 DIAGNOSIS — C541 Malignant neoplasm of endometrium: Secondary | ICD-10-CM

## 2015-06-03 DIAGNOSIS — F329 Major depressive disorder, single episode, unspecified: Secondary | ICD-10-CM | POA: Diagnosis not present

## 2015-06-03 DIAGNOSIS — D649 Anemia, unspecified: Secondary | ICD-10-CM | POA: Diagnosis not present

## 2015-06-03 DIAGNOSIS — Z5111 Encounter for antineoplastic chemotherapy: Secondary | ICD-10-CM | POA: Diagnosis not present

## 2015-06-03 DIAGNOSIS — Z90710 Acquired absence of both cervix and uterus: Secondary | ICD-10-CM

## 2015-06-03 DIAGNOSIS — Z8542 Personal history of malignant neoplasm of other parts of uterus: Secondary | ICD-10-CM

## 2015-06-03 LAB — CBC WITH DIFFERENTIAL/PLATELET
Basophils Absolute: 0 10*3/uL (ref 0–0.1)
Basophils Relative: 1 %
Eosinophils Absolute: 0.1 10*3/uL (ref 0–0.7)
Eosinophils Relative: 2 %
HCT: 26.2 % — ABNORMAL LOW (ref 35.0–47.0)
Hemoglobin: 8.3 g/dL — ABNORMAL LOW (ref 12.0–16.0)
Lymphocytes Relative: 11 %
Lymphs Abs: 0.5 10*3/uL — ABNORMAL LOW (ref 1.0–3.6)
MCH: 25.9 pg — ABNORMAL LOW (ref 26.0–34.0)
MCHC: 31.7 g/dL — ABNORMAL LOW (ref 32.0–36.0)
MCV: 81.6 fL (ref 80.0–100.0)
Monocytes Absolute: 0.4 10*3/uL (ref 0.2–0.9)
Monocytes Relative: 10 %
Neutro Abs: 3.5 10*3/uL (ref 1.4–6.5)
Neutrophils Relative %: 76 %
Platelets: 245 10*3/uL (ref 150–440)
RBC: 3.21 MIL/uL — ABNORMAL LOW (ref 3.80–5.20)
RDW: 16.6 % — ABNORMAL HIGH (ref 11.5–14.5)
WBC: 4.6 10*3/uL (ref 3.6–11.0)

## 2015-06-03 LAB — COMPREHENSIVE METABOLIC PANEL
ALT: 10 U/L — ABNORMAL LOW (ref 14–54)
AST: 15 U/L (ref 15–41)
Albumin: 2.9 g/dL — ABNORMAL LOW (ref 3.5–5.0)
Alkaline Phosphatase: 97 U/L (ref 38–126)
Anion gap: 6 (ref 5–15)
BUN: 16 mg/dL (ref 6–20)
CO2: 28 mmol/L (ref 22–32)
Calcium: 8.1 mg/dL — ABNORMAL LOW (ref 8.9–10.3)
Chloride: 93 mmol/L — ABNORMAL LOW (ref 101–111)
Creatinine, Ser: 0.99 mg/dL (ref 0.44–1.00)
GFR calc Af Amer: >60 mL/min (ref >60)
GFR calc non Af Amer: 58 mL/min — ABNORMAL LOW (ref >60)
Glucose, Bld: 104 mg/dL — ABNORMAL HIGH (ref 65–99)
Potassium: 3.8 mmol/L (ref 3.5–5.1)
Sodium: 127 mmol/L — ABNORMAL LOW (ref 135–145)
Total Bilirubin: 0.3 mg/dL (ref 0.3–1.2)
Total Protein: 6.7 g/dL (ref 6.5–8.1)

## 2015-06-03 LAB — MAGNESIUM: Magnesium: 1.9 mg/dL (ref 1.7–2.4)

## 2015-06-03 MED ORDER — SODIUM CHLORIDE 0.9 % IJ SOLN
10.0000 mL | INTRAMUSCULAR | Status: AC | PRN
  Filled 2015-06-03: qty 10

## 2015-06-03 MED ORDER — HEPARIN SOD (PORK) LOCK FLUSH 100 UNIT/ML IV SOLN
500.0000 [IU] | Freq: Once | INTRAVENOUS | Status: AC
  Administered 2015-06-03: 500 [IU] via INTRAVENOUS

## 2015-06-03 MED ORDER — SODIUM CHLORIDE 0.9 % IV SOLN
180.0000 mg | Freq: Once | INTRAVENOUS | Status: AC
  Administered 2015-06-03: 180 mg via INTRAVENOUS
  Filled 2015-06-03: qty 18

## 2015-06-03 MED ORDER — CITALOPRAM HYDROBROMIDE 40 MG PO TABS: 40.0000 mg | ORAL_TABLET | Freq: Every day | ORAL | Status: DC

## 2015-06-03 MED ORDER — SODIUM CHLORIDE 0.9 % IV SOLN
Freq: Once | INTRAVENOUS | Status: AC
  Administered 2015-06-03: 11:00:00 via INTRAVENOUS
  Filled 2015-06-03: qty 1000

## 2015-06-03 MED ORDER — HEPARIN SOD (PORK) LOCK FLUSH 100 UNIT/ML IV SOLN
500.0000 [IU] | Freq: Once | INTRAVENOUS | Status: AC | PRN
  Filled 2015-06-03: qty 5

## 2015-06-03 NOTE — Progress Notes (Signed)
[place] @ [facility] [phone]  Fax:(336) [phone]     Conleigh Heinlein OB: Oct 16, 1951  MR#: [id number]  IWP#:[id number]  [name], MD as PCP - General (Family Medicine) [name], MD (General Surgery)  CHIEF COMPLAINT:  Chief Complaint  Patient presents with  . Follow-up    Oncology History   Chief Complaint/Diagnosis:   The patient is a 64 year old with metastatic endometrial cancer who is seen for assessment during ongoing palliative radiation therapy.  04/2011   papillary serous adenocarcinoma of the endometrium, stage IIIb (nodal metastasis)              TAHBSO and full staging,               GOG protocl using XRT and chemotherapy, significant XRT side-effects,               continued on chemotherapy off protocol, 04/2013   recurrence in the nodal areas,              DDP and docetaxel x 6 completed in December,   started on chemotherapy with Adriamycin(October, 2015) poor tolerance to letrozole  and affinator Estrogen and progesterone receptor negative tumor         hER-2/neu recetor negative. November 07, 2014 progressive disease by  tumor marker criteria and clinical examination November 12, 2014 After 6 cycles of chemotherapy because of increasing side effect chemotherapy was discontinued.  (March, 2016) Patient was started on cis-platinum and topotecan. May, 2016 Cis-platinum into Portec and was discontinued because of significant side effect and possibility of progressive disease.  Patient would be started on NIVOLULAMAB on compassionate use basis     Cancer of uterus   04/18/2015 Initial Diagnosis Cancer of uterus    Oncology Flowsheet 04/21/2015 05/06/2015 05/20/2015 06/03/2015  nivolumab (OPDIVO) IV 180 mg 180 mg 180 mg 180 mg    INTERVAL HISTORY: 64 year old lady came today with progressing endometrial cancer.  Increasing left supraclavicular lymph node.  Increasing pain after increasing fentanyl patch pain is  somewhat better.  Patient is very depressed May 31.  2016 Patient is here for continuation of NIVOLULAMAB, tolerated first treatment very well without any significant problem.  No diarrhea.  No rash.  After increasing fentanyl patch pain is improved June 03, 2015 Patient with stage IV carcinoma of endometrium came today for further follow-up.  Patient is very emotional and crying off and on.  Left neck mass is increasing in size.  Flank pain and back pain is improved after increasing fentanyl patch.  Patient is tolerating NIVOLULAMAB without any significant breast nausea vomiting diarrhea or shortness of breath REVIEW OF SYSTEMS:   Gen. status: Patient is feeling weak and tired no chills no fever.  HEENT: Left supraclavicular lymph node has increased in size.  GI: No nausea.  No vomiting.  No diarrhea.  Musculoskeletal system increasing back pain had a bone scan done.  Cardiac: No chest pain lungs: No shortness of breath lower extremity no edema.  Skin: No rash.  Neurological system no headache no dizziness.  No other focal signs May 06, 2015 Left supraclavicular swelling is stable.  Patient's pain is improved after increasing fentanyl patch As per HPI. Otherwise, a complete review of systems is negatve.  PAST MEDICAL HISTORY: Past Medical History  Diagnosis Date  . Cancer 2012  . GI problem 2013  . Cancer of uterus 04/18/2015    PAST SURGICAL HISTORY: Past Surgical History  Procedure Laterality Date  .  Abdominal hysterectomy      FAMILY HISTORY No significant family history. GYNECOLOGIC HISTORY:  No LMP recorded. Patient has had a hysterectomy.     ADVANCED DIRECTIVES: Patient does have advance directive   HEALTH MAINTENANCE: History  Substance Use Topics  . Smoking status: Never Smoker   . Smokeless tobacco: Never Used  . Alcohol Use: Yes      Allergies  Allergen Reactions  . Penicillins Rash    Current Outpatient Prescriptions  Medication Sig Dispense Refill  .  ALPRAZolam (XANAX) 0.25 MG tablet Take 0.25 mg by mouth 2 (two) times daily as needed for anxiety.     . bismuth subsalicylate (PEPTO BISMOL) 262 MG chewable tablet Chew 524 mg by mouth as needed for indigestion.     . cholestyramine (QUESTRAN) 4 G packet Take 1 packet by mouth 3 (three) times daily as needed.     . fentaNYL (DURAGESIC - DOSED MCG/HR) 25 MCG/HR patch     . fentaNYL (DURAGESIC - DOSED MCG/HR) 50 MCG/HR     . fentaNYL (DURAGESIC - DOSED MCG/HR) 75 MCG/HR Place 1 patch (75 mcg total) onto the skin every 3 (three) days. 10 patch 0  . fexofenadine (ALLEGRA) 180 MG tablet Take 180 mg by mouth as needed.     . fluticasone (FLONASE) 50 MCG/ACT nasal spray Place 1-2 sprays into the nose daily as needed.    Marland Kitchen HYDROcodone-acetaminophen (NORCO/VICODIN) 5-325 MG per tablet Take 1 tablet by mouth every 6 (six) hours as needed for moderate pain. 60 tablet 0  . lidocaine-prilocaine (EMLA) cream Apply to affected area once 30 g 3  . metoCLOPramide (REGLAN) 10 MG tablet Take 10 mg by mouth 4 (four) times daily.    . ondansetron (ZOFRAN) 4 MG tablet TAKE 1 TABLET BY MOUTH EVERY 6 HOURS AS NEEDED 60 tablet 0  . polyethylene glycol powder (GLYCOLAX/MIRALAX) powder Take by mouth.    . potassium chloride (K-DUR) 10 MEQ tablet Take 1 tablet (10 mEq total) by mouth daily. 30 tablet 3  . predniSONE (DELTASONE) 10 MG tablet Take 1 tab po x 14 days, then take 0.5 tab every other day for 2 weeks then stop 18 tablet 0  . ranitidine (ZANTAC) 150 MG tablet Take 1 tablet by mouth 2 (two) times daily.    . citalopram (CELEXA) 40 MG tablet Take 1 tablet (40 mg total) by mouth daily. 30 tablet 3   No current facility-administered medications for this visit.   Facility-Administered Medications Ordered in Other Visits  Medication Dose Route Frequency Provider Last Rate Last Dose  . heparin lock flush 100 unit/mL  500 Units Intracatheter Once PRN Forest Gleason, MD      . sodium chloride 0.9 % injection 10 mL  10 mL  Intracatheter PRN Forest Gleason, MD   10 mL at 05/06/15 1028  . sodium chloride 0.9 % injection 10 mL  10 mL Intravenous PRN Forest Gleason, MD        OBJECTIVE:  Filed Vitals:   06/03/15 0958  BP: 109/67  Pulse: 98  Temp: 97.1 F (36.2 C)     Body mass index is 25.39 kg/(m^2).    ECOG FS:1 - Symptomatic but completely ambulatory  PHYSICAL EXAM: Gen. status: Alert and oriented lady not any acute distress Patient is very emotional with frequent crying spells HEENT: Left supraclavicular passes increase in size significantly.  Tender. TCardiac exam revealed the PMI to be normally situated and sized. The rhythm was regular and no extrasystoles were noted  during several minutes of auscultation. The first and second heart sounds were normal and physiologic splitting of the second heart sound was noted. There were no murmurs, rubs, clicks, or gallops.. Examination of the chest was unremarkable. There were no bony deformities, no asymmetry, and no other abnormalities.. Neurologically, the patient was awake, alert, and oriented to person, place and time. There were no obvious focal neurologic abnormalities. Examination of the skin revealed no evidence of significant rashes, suspicious appearing nevi or other concerning lesions.  Lower extremityswelling. Psychiatric system: Patient is   depressed LAB RESULTS:  Infusion on 06/03/2015  Component Date Value Ref Range Status  . WBC 06/03/2015 4.6  3.6 - 11.0 K/uL Final   A-LINE DRAW  . RBC 06/03/2015 3.21* 3.80 - 5.20 MIL/uL Final  . Hemoglobin 06/03/2015 8.3* 12.0 - 16.0 g/dL Final  . HCT 06/03/2015 26.2* 35.0 - 47.0 % Final  . MCV 06/03/2015 81.6  80.0 - 100.0 fL Final  . MCH 06/03/2015 25.9* 26.0 - 34.0 pg Final  . MCHC 06/03/2015 31.7* 32.0 - 36.0 g/dL Final  . RDW 06/03/2015 16.6* 11.5 - 14.5 % Final  . Platelets 06/03/2015 245  150 - 440 K/uL Final  . Neutrophils Relative % 06/03/2015 76   Final  . Neutro Abs 06/03/2015 3.5  1.4 - 6.5  K/uL Final  . Lymphocytes Relative 06/03/2015 11   Final  . Lymphs Abs 06/03/2015 0.5* 1.0 - 3.6 K/uL Final  . Monocytes Relative 06/03/2015 10   Final  . Monocytes Absolute 06/03/2015 0.4  0.2 - 0.9 K/uL Final  . Eosinophils Relative 06/03/2015 2   Final  . Eosinophils Absolute 06/03/2015 0.1  0 - 0.7 K/uL Final  . Basophils Relative 06/03/2015 1   Final  . Basophils Absolute 06/03/2015 0.0  0 - 0.1 K/uL Final  . Sodium 06/03/2015 127* 135 - 145 mmol/L Final  . Potassium 06/03/2015 3.8  3.5 - 5.1 mmol/L Final  . Chloride 06/03/2015 93* 101 - 111 mmol/L Final  . CO2 06/03/2015 28  22 - 32 mmol/L Final  . Glucose, Bld 06/03/2015 104* 65 - 99 mg/dL Final  . BUN 06/03/2015 16  6 - 20 mg/dL Final  . Creatinine, Ser 06/03/2015 0.99  0.44 - 1.00 mg/dL Final  . Calcium 06/03/2015 8.1* 8.9 - 10.3 mg/dL Final  . Total Protein 06/03/2015 6.7  6.5 - 8.1 g/dL Final  . Albumin 06/03/2015 2.9* 3.5 - 5.0 g/dL Final  . AST 06/03/2015 15  15 - 41 U/L Final  . ALT 06/03/2015 10* 14 - 54 U/L Final  . Alkaline Phosphatase 06/03/2015 97  38 - 126 U/L Final  . Total Bilirubin 06/03/2015 0.3  0.3 - 1.2 mg/dL Final  . GFR calc non Af Amer 06/03/2015 58* >60 mL/min Final  . GFR calc Af Amer 06/03/2015 >60  >60 mL/min Final   Comment: (NOTE) The eGFR has been calculated using the CKD EPI equation. This calculation has not been validated in all clinical situations. eGFR's persistently <60 mL/min signify possible Chronic Kidney Disease.   . Anion gap 06/03/2015 6  5 - 15 Final  . Magnesium 06/03/2015 1.9  1.7 - 2.4 mg/dL Final    Lab Results  Component Value Date   CA125 18.8 07/23/2014     STUDIES: No results found.  ASSESSMENT: Carcinoma of endometrium progressive disease Progressing disease.  Patient is here for further evaluation regarding fourth cycle of NIVOLULAMAB  MEDICAL DECISION MAKING:  Lab data has been reviewed. Persistent anemia multifactorial  Depression patient has been taken  OFF AND STARTED ON CELEXA 40 MG DAILY continue NIVOLULAMAB for total 6 cycles before further evaluation is planned Total duration of visit was 45 minutes.  50% or more time was spent in counseling patient and family regarding prognosis and options of treatment and available resources   Cancer of uterus   Staging form: Corpus Uteri - Carcinoma, AJCC 7th Edition     Clinical: Stage IVB (T3b, N2, M1) - Signed by Forest Gleason, MD on 04/18/2015   Forest Gleason, MD   06/03/2015 7:28 PM

## 2015-06-03 NOTE — Progress Notes (Signed)
Patient does have living will. Never smoked. 

## 2015-06-11 ENCOUNTER — Telehealth: Payer: Self-pay | Admitting: *Deleted

## 2015-06-11 DIAGNOSIS — Z8542 Personal history of malignant neoplasm of other parts of uterus: Secondary | ICD-10-CM

## 2015-06-12 MED ORDER — HYDROCODONE-ACETAMINOPHEN 5-325 MG PO TABS: 1.0000 | ORAL_TABLET | Freq: Four times a day (QID) | ORAL | Status: DC | PRN

## 2015-06-12 MED ORDER — FENTANYL 75 MCG/HR TD PT72: 75.0000 ug | MEDICATED_PATCH | TRANSDERMAL | Status: DC

## 2015-06-12 NOTE — Telephone Encounter (Signed)
Rx for fentanyl and hydrocodone ready for pick up.

## 2015-06-12 NOTE — Telephone Encounter (Signed)
Called patient and left message that her prescriptions are available for pick up at the registration desk.

## 2015-06-17 ENCOUNTER — Encounter: Payer: Self-pay | Admitting: Oncology

## 2015-06-17 ENCOUNTER — Inpatient Hospital Stay: Payer: Medicare Other | Attending: Oncology | Admitting: Oncology

## 2015-06-17 ENCOUNTER — Inpatient Hospital Stay: Payer: Medicare Other

## 2015-06-17 VITALS — BP 122/65 | HR 59 | Temp 96.2°F | Wt 134.3 lb

## 2015-06-17 DIAGNOSIS — D649 Anemia, unspecified: Secondary | ICD-10-CM | POA: Diagnosis not present

## 2015-06-17 DIAGNOSIS — C541 Malignant neoplasm of endometrium: Secondary | ICD-10-CM | POA: Diagnosis not present

## 2015-06-17 DIAGNOSIS — C55 Malignant neoplasm of uterus, part unspecified: Secondary | ICD-10-CM

## 2015-06-17 DIAGNOSIS — Z79899 Other long term (current) drug therapy: Secondary | ICD-10-CM | POA: Diagnosis not present

## 2015-06-17 DIAGNOSIS — M549 Dorsalgia, unspecified: Secondary | ICD-10-CM | POA: Insufficient documentation

## 2015-06-17 DIAGNOSIS — Z5111 Encounter for antineoplastic chemotherapy: Secondary | ICD-10-CM | POA: Insufficient documentation

## 2015-06-17 LAB — CBC WITH DIFFERENTIAL/PLATELET
Basophils Absolute: 0 10*3/uL (ref 0–0.1)
Basophils Relative: 1 %
Eosinophils Absolute: 0.1 10*3/uL (ref 0–0.7)
Eosinophils Relative: 3 %
HCT: 24.3 % — ABNORMAL LOW (ref 35.0–47.0)
Hemoglobin: 7.6 g/dL — ABNORMAL LOW (ref 12.0–16.0)
LYMPHS ABS: 0.5 10*3/uL — AB (ref 1.0–3.6)
Lymphocytes Relative: 12 %
MCH: 25.4 pg — ABNORMAL LOW (ref 26.0–34.0)
MCHC: 31.2 g/dL — AB (ref 32.0–36.0)
MCV: 81.5 fL (ref 80.0–100.0)
MONO ABS: 0.4 10*3/uL (ref 0.2–0.9)
Monocytes Relative: 10 %
NEUTROS ABS: 2.9 10*3/uL (ref 1.4–6.5)
Neutrophils Relative %: 74 %
PLATELETS: 228 10*3/uL (ref 150–440)
RBC: 2.98 MIL/uL — ABNORMAL LOW (ref 3.80–5.20)
RDW: 16.7 % — AB (ref 11.5–14.5)
WBC: 3.9 10*3/uL (ref 3.6–11.0)

## 2015-06-17 LAB — COMPREHENSIVE METABOLIC PANEL
ALK PHOS: 79 U/L (ref 38–126)
ALT: 9 U/L — ABNORMAL LOW (ref 14–54)
ANION GAP: 5 (ref 5–15)
AST: 17 U/L (ref 15–41)
Albumin: 3.1 g/dL — ABNORMAL LOW (ref 3.5–5.0)
BUN: 22 mg/dL — ABNORMAL HIGH (ref 6–20)
CALCIUM: 7.9 mg/dL — AB (ref 8.9–10.3)
CHLORIDE: 97 mmol/L — AB (ref 101–111)
CO2: 27 mmol/L (ref 22–32)
Creatinine, Ser: 1.67 mg/dL — ABNORMAL HIGH (ref 0.44–1.00)
GFR calc Af Amer: 36 mL/min — ABNORMAL LOW (ref >60)
GFR calc non Af Amer: 31 mL/min — ABNORMAL LOW (ref >60)
GLUCOSE: 88 mg/dL (ref 65–99)
Potassium: 4.2 mmol/L (ref 3.5–5.1)
Sodium: 129 mmol/L — ABNORMAL LOW (ref 135–145)
Total Bilirubin: 0.3 mg/dL (ref 0.3–1.2)
Total Protein: 6.1 g/dL — ABNORMAL LOW (ref 6.5–8.1)

## 2015-06-17 LAB — ABO/RH: ABO/RH(D): O POS

## 2015-06-17 LAB — MAGNESIUM: Magnesium: 2 mg/dL (ref 1.7–2.4)

## 2015-06-17 LAB — PREPARE RBC (CROSSMATCH)

## 2015-06-17 MED ORDER — SODIUM CHLORIDE 0.9 % IV SOLN
Freq: Once | INTRAVENOUS | Status: AC
  Administered 2015-06-17: 11:00:00 via INTRAVENOUS
  Filled 2015-06-17: qty 1000

## 2015-06-17 MED ORDER — HEPARIN SOD (PORK) LOCK FLUSH 100 UNIT/ML IV SOLN
500.0000 [IU] | Freq: Once | INTRAVENOUS | Status: AC | PRN
  Administered 2015-06-17: 500 [IU]
  Filled 2015-06-17: qty 5

## 2015-06-17 MED ORDER — SODIUM CHLORIDE 0.9 % IV SOLN
180.0000 mg | Freq: Once | INTRAVENOUS | Status: AC
  Administered 2015-06-17: 180 mg via INTRAVENOUS
  Filled 2015-06-17: qty 14

## 2015-06-17 MED ORDER — SODIUM CHLORIDE 0.9 % IJ SOLN
10.0000 mL | INTRAMUSCULAR | Status: DC | PRN
  Filled 2015-06-17: qty 10

## 2015-06-17 NOTE — Progress Notes (Signed)
[place] @ [facility] Telephone:(336) [phone]  Fax:(336) [phone]     Karrie Fluellen OB: Sep 24, 1951  MR#: [id number]  YHC#:[id number]  [name], MD as PCP - General (Family Medicine) [name], MD (General Surgery)  CHIEF COMPLAINT:  Chief Complaint  Patient presents with  . Acute Visit    Oncology History   Chief Complaint/Diagnosis:   The patient is a 64 year old with metastatic endometrial cancer who is seen for assessment during ongoing palliative radiation therapy.  04/2011   papillary serous adenocarcinoma of the endometrium, stage IIIb (nodal metastasis)              TAHBSO and full staging,               GOG protocl using XRT and chemotherapy, significant XRT side-effects,               continued on chemotherapy off protocol, 04/2013   recurrence in the nodal areas,              DDP and docetaxel x 6 completed in December,   started on chemotherapy with Adriamycin(October, 2015) poor tolerance to letrozole  and affinator Estrogen and progesterone receptor negative tumor         hER-2/neu recetor negative. November 07, 2014 progressive disease by  tumor marker criteria and clinical examination November 12, 2014 After 6 cycles of chemotherapy because of increasing side effect chemotherapy was discontinued.  (March, 2016) Patient was started on cis-platinum and topotecan. May, 2016 Cis-platinum into Portec and was discontinued because of significant side effect and possibility of progressive disease.  Patient would be started on NIVOLULAMAB on compassionate use basis     Cancer of uterus   04/18/2015 Initial Diagnosis Cancer of uterus    Oncology Flowsheet 04/21/2015 05/06/2015 05/20/2015 06/03/2015 06/17/2015  nivolumab (OPDIVO) IV 180 mg 180 mg 180 mg 180 mg 180 mg    INTERVAL HISTORY: 64 year old lady came today with progressing endometrial cancer.  Increasing left supraclavicular lymph node.  Increasing pain after increasing  fentanyl patch pain is somewhat better.  Patient is very depressed May 31.  2016 Patient is here for continuation of NIVOLULAMAB, tolerated first treatment very well without any significant problem.  No diarrhea.  No rash.  After increasing fentanyl patch pain is improved June 03, 2015 Patient with stage IV carcinoma of endometrium came today for further follow-up.  Patient is very emotional and crying off and on.  Left neck mass is increasing in size.  Flank pain and back pain is improved after increasing fentanyl patch.  Patient is tolerating NIVOLULAMAB without any significant breast nausea vomiting diarrhea or shortness of breath June 17, 2015 Patient was supposed to get NIVO for progressing endometrial cancer.  However during the treatment patient complained of feeling weak and tired.  More pain on the left side of the neck.  So being evaluated.  Hemoglobin is 7.6. REVIEW OF SYSTEMS:    general status: Patient is feeling weak and tired.  No change in a performance status.  No chills.  No fever. HEENT: Alopecia.  No evidence of stomatitis increasing discomfort on the left side of the neck Lungs: No cough or shortness of breath Cardiac: No chest pain or paroxysmal nocturnal dyspnea GI: No nausea no vomiting no diarrhea no abdominal pain Skin: No rash Lower extremity no swelling Neurological system: No tingling.  No numbness.  No other focal signs Musculoskeletal system no bony pains    PAST MEDICAL HISTORY:  Past Medical History  Diagnosis Date  . Cancer 2012  . GI problem 2013  . Cancer of uterus 04/18/2015    PAST SURGICAL HISTORY: Past Surgical History  Procedure Laterality Date  . Abdominal hysterectomy      FAMILY HISTORY No significant family history.      ADVANCED DIRECTIVES: Patient does have advance directive   HEALTH MAINTENANCE: History  Substance Use Topics  . Smoking status: Never Smoker   . Smokeless tobacco: Never Used  . Alcohol Use: Yes       Allergies  Allergen Reactions  . Penicillins Rash    Current Outpatient Prescriptions  Medication Sig Dispense Refill  . ALPRAZolam (XANAX) 0.25 MG tablet Take 0.25 mg by mouth 2 (two) times daily as needed for anxiety.     . bismuth subsalicylate (PEPTO BISMOL) 262 MG chewable tablet Chew 524 mg by mouth as needed for indigestion.     . cholestyramine (QUESTRAN) 4 G packet Take 1 packet by mouth 3 (three) times daily as needed.     . citalopram (CELEXA) 40 MG tablet Take 1 tablet (40 mg total) by mouth daily. 30 tablet 3  . fentaNYL (DURAGESIC - DOSED MCG/HR) 75 MCG/HR Place 1 patch (75 mcg total) onto the skin every 3 (three) days. 10 patch 0  . fexofenadine (ALLEGRA) 180 MG tablet Take 180 mg by mouth as needed.     . fluticasone (FLONASE) 50 MCG/ACT nasal spray Place 1-2 sprays into the nose daily as needed.    Marland Kitchen HYDROcodone-acetaminophen (NORCO/VICODIN) 5-325 MG per tablet Take 1 tablet by mouth every 6 (six) hours as needed for moderate pain. 60 tablet 0  . lidocaine-prilocaine (EMLA) cream Apply to affected area once 30 g 3  . metoCLOPramide (REGLAN) 10 MG tablet Take 10 mg by mouth 4 (four) times daily.    . ondansetron (ZOFRAN) 4 MG tablet TAKE 1 TABLET BY MOUTH EVERY 6 HOURS AS NEEDED 60 tablet 0  . polyethylene glycol powder (GLYCOLAX/MIRALAX) powder Take by mouth.    . potassium chloride (K-DUR) 10 MEQ tablet Take 1 tablet (10 mEq total) by mouth daily. 30 tablet 3  . predniSONE (DELTASONE) 10 MG tablet Take 1 tab po x 14 days, then take 0.5 tab every other day for 2 weeks then stop 18 tablet 0  . ranitidine (ZANTAC) 150 MG tablet Take 1 tablet by mouth 2 (two) times daily.     No current facility-administered medications for this visit.   Facility-Administered Medications Ordered in Other Visits  Medication Dose Route Frequency Provider Last Rate Last Dose  . sodium chloride 0.9 % injection 10 mL  10 mL Intracatheter PRN Forest Gleason, MD   10 mL at 05/06/15 1028  .  sodium chloride 0.9 % injection 10 mL  10 mL Intravenous PRN Forest Gleason, MD      . sodium chloride 0.9 % injection 10 mL  10 mL Intracatheter PRN Forest Gleason, MD        OBJECTIVE:  Filed Vitals:   06/17/15 1343  BP: 122/65  Pulse: 59  Temp: 96.2 F (35.7 C)     Body mass index is 23.79 kg/(m^2).    ECOG FS:1 - Symptomatic but completely ambulatory  PHYSICAL EXAM: Gen. status: Alert and oriented lady not any acute distress Patient is very emotional with frequent crying spells Eyes: Pale-looking sclera and conjunctiva HEENT: Left supraclavicular  Mass .  Tender.  There is some swelling around neck area.  Size is not changed significantly since  last evaluation TCardiac exam revealed the PMI to be normally situated and sized. The rhythm was regular and no extrasystoles were noted during several minutes of auscultation. The first and second heart sounds were normal and physiologic splitting of the second heart sound was noted. There were no murmurs, rubs, clicks, or gallops.. Examination of the chest was unremarkable. There were no bony deformities, no asymmetry, and no other abnormalities.. Neurologically, the patient was awake, alert, and oriented to person, place and time. There were no obvious focal neurologic abnormalities. Examination of the skin revealed no evidence of significant rashes, suspicious appearing nevi or other concerning lesions.  Lower extremityswelling. Psychiatric system: Patient is   depressed LAB RESULTS:  Infusion on 06/17/2015  Component Date Value Ref Range Status  . WBC 06/17/2015 3.9  3.6 - 11.0 K/uL Final  . RBC 06/17/2015 2.98* 3.80 - 5.20 MIL/uL Final  . Hemoglobin 06/17/2015 7.6* 12.0 - 16.0 g/dL Final  . HCT 06/17/2015 24.3* 35.0 - 47.0 % Final  . MCV 06/17/2015 81.5  80.0 - 100.0 fL Final  . MCH 06/17/2015 25.4* 26.0 - 34.0 pg Final  . MCHC 06/17/2015 31.2* 32.0 - 36.0 g/dL Final  . RDW 06/17/2015 16.7* 11.5 - 14.5 % Final  . Platelets  06/17/2015 228  150 - 440 K/uL Final  . Neutrophils Relative % 06/17/2015 74   Final  . Neutro Abs 06/17/2015 2.9  1.4 - 6.5 K/uL Final  . Lymphocytes Relative 06/17/2015 12   Final  . Lymphs Abs 06/17/2015 0.5* 1.0 - 3.6 K/uL Final  . Monocytes Relative 06/17/2015 10   Final  . Monocytes Absolute 06/17/2015 0.4  0.2 - 0.9 K/uL Final  . Eosinophils Relative 06/17/2015 3   Final  . Eosinophils Absolute 06/17/2015 0.1  0 - 0.7 K/uL Final  . Basophils Relative 06/17/2015 1   Final  . Basophils Absolute 06/17/2015 0.0  0 - 0.1 K/uL Final  . Sodium 06/17/2015 129* 135 - 145 mmol/L Final  . Potassium 06/17/2015 4.2  3.5 - 5.1 mmol/L Final  . Chloride 06/17/2015 97* 101 - 111 mmol/L Final  . CO2 06/17/2015 27  22 - 32 mmol/L Final  . Glucose, Bld 06/17/2015 88  65 - 99 mg/dL Final  . BUN 06/17/2015 22* 6 - 20 mg/dL Final  . Creatinine, Ser 06/17/2015 1.67* 0.44 - 1.00 mg/dL Final  . Calcium 06/17/2015 7.9* 8.9 - 10.3 mg/dL Final  . Total Protein 06/17/2015 6.1* 6.5 - 8.1 g/dL Final  . Albumin 06/17/2015 3.1* 3.5 - 5.0 g/dL Final  . AST 06/17/2015 17  15 - 41 U/L Final  . ALT 06/17/2015 9* 14 - 54 U/L Final  . Alkaline Phosphatase 06/17/2015 79  38 - 126 U/L Final  . Total Bilirubin 06/17/2015 0.3  0.3 - 1.2 mg/dL Final  . GFR calc non Af Amer 06/17/2015 31* >60 mL/min Final  . GFR calc Af Amer 06/17/2015 36* >60 mL/min Final   Comment: (NOTE) The eGFR has been calculated using the CKD EPI equation. This calculation has not been validated in all clinical situations. eGFR's persistently <60 mL/min signify possible Chronic Kidney Disease.   . Anion gap 06/17/2015 5  5 - 15 Final  . Magnesium 06/17/2015 2.0  1.7 - 2.4 mg/dL Final    Lab Results  Component Value Date   CA125 18.8 07/23/2014    ASSESSMENT: Carcinoma of endometrium progressive disease Progressing disease.  Patient is here for further evaluation regarding fourth cycle of NIVOLULAMAB Anemia with hemoglobin of  7.6  MEDICAL DECISION MAKING:  Back pain has improved significantly.  Will continue fentanyl patch and pain medication with Vicodin. Anemia may be contribution to patient's generalized feeling of weakness will transfuse 1 unit of packed cell and reassess whether patient is feeling any better. Continue chemotherapy   Cancer of uterus   Staging form: Corpus Uteri - Carcinoma, AJCC 7th Edition     Clinical: Stage IVB (T3b, N2, M1) - Signed by Forest Gleason, MD on 04/18/2015   Forest Gleason, MD   06/17/2015 2:39 PM

## 2015-06-17 NOTE — Progress Notes (Signed)
Patient does have living will.  Never smoked.  Patient added on today due to no energy.  Feels like she is sleeping too much.  Sleeps a lot in the day and then all night.  Appetite decreased.  Nauseated.  C/o dizziness, hands shaking and pain around tumor on her neck.

## 2015-06-18 ENCOUNTER — Inpatient Hospital Stay: Payer: Medicare Other

## 2015-06-18 VITALS — BP 108/66 | HR 56 | Temp 97.7°F | Resp 20

## 2015-06-18 DIAGNOSIS — Z5111 Encounter for antineoplastic chemotherapy: Secondary | ICD-10-CM | POA: Diagnosis not present

## 2015-06-18 DIAGNOSIS — C55 Malignant neoplasm of uterus, part unspecified: Secondary | ICD-10-CM

## 2015-06-18 MED ORDER — HEPARIN SOD (PORK) LOCK FLUSH 100 UNIT/ML IV SOLN
500.0000 [IU] | Freq: Every day | INTRAVENOUS | Status: AC | PRN
  Administered 2015-06-18: 500 [IU]
  Filled 2015-06-18: qty 5

## 2015-06-18 MED ORDER — SODIUM CHLORIDE 0.9 % IV SOLN
250.0000 mL | Freq: Once | INTRAVENOUS | Status: AC
  Administered 2015-06-18: 250 mL via INTRAVENOUS
  Filled 2015-06-18: qty 250

## 2015-06-18 MED ORDER — SODIUM CHLORIDE 0.9 % IJ SOLN
10.0000 mL | INTRAMUSCULAR | Status: DC | PRN
  Filled 2015-06-18: qty 10

## 2015-06-18 MED ORDER — DIPHENHYDRAMINE HCL 25 MG PO CAPS
25.0000 mg | ORAL_CAPSULE | Freq: Once | ORAL | Status: AC
  Administered 2015-06-18: 25 mg via ORAL
  Filled 2015-06-18: qty 1

## 2015-06-18 MED ORDER — ACETAMINOPHEN 325 MG PO TABS
650.0000 mg | ORAL_TABLET | Freq: Once | ORAL | Status: AC
  Administered 2015-06-18: 650 mg via ORAL
  Filled 2015-06-18: qty 2

## 2015-06-19 LAB — TYPE AND SCREEN
ABO/RH(D): O POS
ANTIBODY SCREEN: NEGATIVE
UNIT DIVISION: 0

## 2015-06-27 ENCOUNTER — Other Ambulatory Visit: Payer: Self-pay | Admitting: *Deleted

## 2015-06-27 DIAGNOSIS — C55 Malignant neoplasm of uterus, part unspecified: Secondary | ICD-10-CM

## 2015-07-01 ENCOUNTER — Inpatient Hospital Stay: Payer: Medicare Other

## 2015-07-01 ENCOUNTER — Inpatient Hospital Stay (HOSPITAL_BASED_OUTPATIENT_CLINIC_OR_DEPARTMENT_OTHER): Payer: Medicare Other | Admitting: Oncology

## 2015-07-01 ENCOUNTER — Encounter: Payer: Self-pay | Admitting: Oncology

## 2015-07-01 VITALS — BP 117/68 | HR 78 | Temp 97.1°F | Wt 128.7 lb

## 2015-07-01 VITALS — BP 119/66 | HR 74 | Temp 97.6°F | Resp 20

## 2015-07-01 DIAGNOSIS — C55 Malignant neoplasm of uterus, part unspecified: Secondary | ICD-10-CM

## 2015-07-01 DIAGNOSIS — C541 Malignant neoplasm of endometrium: Secondary | ICD-10-CM | POA: Diagnosis not present

## 2015-07-01 DIAGNOSIS — D649 Anemia, unspecified: Secondary | ICD-10-CM

## 2015-07-01 DIAGNOSIS — M549 Dorsalgia, unspecified: Secondary | ICD-10-CM | POA: Diagnosis not present

## 2015-07-01 DIAGNOSIS — Z79899 Other long term (current) drug therapy: Secondary | ICD-10-CM | POA: Diagnosis not present

## 2015-07-01 DIAGNOSIS — Z5111 Encounter for antineoplastic chemotherapy: Secondary | ICD-10-CM | POA: Diagnosis not present

## 2015-07-01 LAB — CBC WITH DIFFERENTIAL/PLATELET
BASOS ABS: 0 10*3/uL (ref 0–0.1)
BASOS PCT: 1 %
EOS PCT: 3 %
Eosinophils Absolute: 0.1 10*3/uL (ref 0–0.7)
HCT: 30.6 % — ABNORMAL LOW (ref 35.0–47.0)
Hemoglobin: 9.9 g/dL — ABNORMAL LOW (ref 12.0–16.0)
LYMPHS PCT: 10 %
Lymphs Abs: 0.4 10*3/uL — ABNORMAL LOW (ref 1.0–3.6)
MCH: 26.8 pg (ref 26.0–34.0)
MCHC: 32.2 g/dL (ref 32.0–36.0)
MCV: 83.1 fL (ref 80.0–100.0)
MONO ABS: 0.5 10*3/uL (ref 0.2–0.9)
MONOS PCT: 12 %
NEUTROS ABS: 2.8 10*3/uL (ref 1.4–6.5)
Neutrophils Relative %: 74 %
PLATELETS: 222 10*3/uL (ref 150–440)
RBC: 3.68 MIL/uL — ABNORMAL LOW (ref 3.80–5.20)
RDW: 16.9 % — ABNORMAL HIGH (ref 11.5–14.5)
WBC: 3.8 10*3/uL (ref 3.6–11.0)

## 2015-07-01 LAB — COMPREHENSIVE METABOLIC PANEL
ALT: 10 U/L — ABNORMAL LOW (ref 14–54)
AST: 21 U/L (ref 15–41)
Albumin: 3.4 g/dL — ABNORMAL LOW (ref 3.5–5.0)
Alkaline Phosphatase: 78 U/L (ref 38–126)
Anion gap: 7 (ref 5–15)
BILIRUBIN TOTAL: 0.6 mg/dL (ref 0.3–1.2)
BUN: 21 mg/dL — AB (ref 6–20)
CO2: 27 mmol/L (ref 22–32)
Calcium: 8.5 mg/dL — ABNORMAL LOW (ref 8.9–10.3)
Chloride: 97 mmol/L — ABNORMAL LOW (ref 101–111)
Creatinine, Ser: 1.75 mg/dL — ABNORMAL HIGH (ref 0.44–1.00)
GFR calc Af Amer: 34 mL/min — ABNORMAL LOW (ref >60)
GFR calc non Af Amer: 29 mL/min — ABNORMAL LOW (ref >60)
Glucose, Bld: 124 mg/dL — ABNORMAL HIGH (ref 65–99)
Potassium: 4.1 mmol/L (ref 3.5–5.1)
SODIUM: 131 mmol/L — AB (ref 135–145)
Total Protein: 6.4 g/dL — ABNORMAL LOW (ref 6.5–8.1)

## 2015-07-01 MED ORDER — SODIUM CHLORIDE 0.9 % IV SOLN
Freq: Once | INTRAVENOUS | Status: AC
  Administered 2015-07-01: 11:00:00 via INTRAVENOUS
  Filled 2015-07-01: qty 1000

## 2015-07-01 MED ORDER — NIVOLUMAB CHEMO INJECTION 100 MG/10ML
180.0000 mg | Freq: Once | INTRAVENOUS | Status: AC
  Administered 2015-07-01: 180 mg via INTRAVENOUS
  Filled 2015-07-01: qty 18

## 2015-07-01 MED ORDER — HEPARIN SOD (PORK) LOCK FLUSH 100 UNIT/ML IV SOLN
500.0000 [IU] | Freq: Once | INTRAVENOUS | Status: AC
  Administered 2015-07-01: 500 [IU] via INTRAVENOUS
  Filled 2015-07-01: qty 5

## 2015-07-01 MED ORDER — FENTANYL 50 MCG/HR TD PT72: 50.0000 ug | MEDICATED_PATCH | TRANSDERMAL | Status: DC

## 2015-07-01 MED ORDER — SODIUM CHLORIDE 0.9 % IJ SOLN
10.0000 mL | INTRAMUSCULAR | Status: DC | PRN
  Administered 2015-07-01: 10 mL via INTRAVENOUS
  Filled 2015-07-01: qty 10

## 2015-07-01 NOTE — Progress Notes (Signed)
[place] @ [facility] [phone]) [phone]  Fax:(336) [phone]     Emmalyn Hinson OB: Jul 18, 1951  MR#: [id number]  GQQ#:[id number]  [name], MD as PCP - General (Family Medicine) [name], MD (General Surgery)  CHIEF COMPLAINT:  Chief Complaint  Patient presents with  . Follow-up    Oncology History   Chief Complaint/Diagnosis:   The patient is a 64 year old with metastatic endometrial cancer who is seen for assessment during ongoing palliative radiation therapy.  04/2011   papillary serous adenocarcinoma of the endometrium, stage IIIb (nodal metastasis)              TAHBSO and full staging,               GOG protocl using XRT and chemotherapy, significant XRT side-effects,               continued on chemotherapy off protocol, 04/2013   recurrence in the nodal areas,              DDP and docetaxel x 6 completed in December,   started on chemotherapy with Adriamycin(October, 2015) poor tolerance to letrozole  and affinator Estrogen and progesterone receptor negative tumor         hER-2/neu recetor negative. November 07, 2014 progressive disease by  tumor marker criteria and clinical examination November 12, 2014 After 6 cycles of chemotherapy because of increasing side effect chemotherapy was discontinued.  (March, 2016) Patient was started on cis-platinum and topotecan. May, 2016 Cis-platinum into Portec and was discontinued because of significant side effect and possibility of progressive disease.  Patient would be started on NIVOLULAMAB on compassionate use basis     Cancer of uterus   04/18/2015 Initial Diagnosis Cancer of uterus    Oncology Flowsheet 04/21/2015 05/06/2015 05/20/2015 06/03/2015 06/17/2015 06/18/2015 07/01/2015  diphenhydrAMINE (BENADRYL) PO - - - - - 25 mg -  nivolumab (OPDIVO) IV 180 mg 180 mg 180 mg 180 mg 180 mg - 180 mg    INTERVAL HISTORY: 64 year old lady came today with progressing endometrial cancer.   Increasing left supraclavicular lymph node.  Increasing pain after increasing fentanyl patch pain is somewhat better.  Patient is very depressed Patient is here for ongoing evaluation regarding metastatic endometrial cancer.  On nivolulamab   .  Tolerating treatment very well without any diarrhea and nausea vomiting.  Left arm supraclavicular lymph node is stable.  Back pain is improved will decrease fentanyl patch to 50 g. Patient continues to have poor appetite and is losing weight. Diarrhea or skin rash REVIEW OF SYSTEMS:    general status: Patient is feeling weak and tired.  No change in a performance status.  No chills.  No fever. HEENT: Alopecia.  No evidence of stomatitis increasing discomfort on the left side of the neck Lungs: No cough or shortness of breath Cardiac: No chest pain or paroxysmal nocturnal dyspnea GI: No nausea no vomiting no diarrhea no abdominal pain Skin: No rash Lower extremity no swelling Neurological system: No tingling.  No numbness.  No other focal signs Musculoskeletal system no bony pains poor appetite has lost approximately 5 pounds in last month  PAST MEDICAL HISTORY: Past Medical History  Diagnosis Date  . Cancer 2012  . GI problem 2013  . Cancer of uterus 04/18/2015    PAST SURGICAL HISTORY: Past Surgical History  Procedure Laterality Date  . Abdominal hysterectomy      FAMILY HISTORY No significant family history.  ADVANCED DIRECTIVES: Patient does have advance directive   HEALTH MAINTENANCE: History  Substance Use Topics  . Smoking status: Never Smoker   . Smokeless tobacco: Never Used  . Alcohol Use: Yes      Allergies  Allergen Reactions  . Penicillins Rash    Current Outpatient Prescriptions  Medication Sig Dispense Refill  . ALPRAZolam (XANAX) 0.25 MG tablet Take 0.25 mg by mouth 2 (two) times daily as needed for anxiety.     . bismuth subsalicylate (PEPTO BISMOL) 262 MG chewable tablet Chew 524 mg by mouth as  needed for indigestion.     . cholestyramine (QUESTRAN) 4 G packet Take 1 packet by mouth 3 (three) times daily as needed.     . citalopram (CELEXA) 40 MG tablet Take 1 tablet (40 mg total) by mouth daily. 30 tablet 3  . fexofenadine (ALLEGRA) 180 MG tablet Take 180 mg by mouth as needed.     . fluticasone (FLONASE) 50 MCG/ACT nasal spray Place 1-2 sprays into the nose daily as needed.    Marland Kitchen HYDROcodone-acetaminophen (NORCO/VICODIN) 5-325 MG per tablet Take 1 tablet by mouth every 6 (six) hours as needed for moderate pain. 60 tablet 0  . lidocaine-prilocaine (EMLA) cream Apply to affected area once 30 g 3  . metoCLOPramide (REGLAN) 10 MG tablet Take 10 mg by mouth 4 (four) times daily.    . ondansetron (ZOFRAN) 4 MG tablet TAKE 1 TABLET BY MOUTH EVERY 6 HOURS AS NEEDED 60 tablet 0  . polyethylene glycol powder (GLYCOLAX/MIRALAX) powder Take by mouth.    . potassium chloride (K-DUR) 10 MEQ tablet Take 1 tablet (10 mEq total) by mouth daily. 30 tablet 3  . predniSONE (DELTASONE) 10 MG tablet Take 1 tab po x 14 days, then take 0.5 tab every other day for 2 weeks then stop 18 tablet 0  . ranitidine (ZANTAC) 150 MG tablet Take 1 tablet by mouth 2 (two) times daily.    . fentaNYL (DURAGESIC - DOSED MCG/HR) 50 MCG/HR Place 1 patch (50 mcg total) onto the skin every 3 (three) days. 10 patch 0  . omeprazole (PRILOSEC) 20 MG capsule      No current facility-administered medications for this visit.   Facility-Administered Medications Ordered in Other Visits  Medication Dose Route Frequency Provider Last Rate Last Dose  . heparin lock flush 100 unit/mL  500 Units Intravenous Once Forest Gleason, MD      . sodium chloride 0.9 % injection 10 mL  10 mL Intracatheter PRN Forest Gleason, MD   10 mL at 05/06/15 1028  . sodium chloride 0.9 % injection 10 mL  10 mL Intravenous PRN Forest Gleason, MD      . sodium chloride 0.9 % injection 10 mL  10 mL Intravenous PRN Forest Gleason, MD   10 mL at 07/01/15 1025     OBJECTIVE:  Filed Vitals:   07/01/15 1101  BP: 117/68  Pulse: 78  Temp: 97.1 F (36.2 C)     Body mass index is 22.81 kg/(m^2).    ECOG FS:1 - Symptomatic but completely ambulatory  PHYSICAL EXAM: Gen. status: Alert and oriented lady not any acute distress Patient is very emotional with frequent crying spells Eyes: Pale-looking sclera and conjunctiva HEENT: Left supraclavicular  Mass .  Mass appears to decrease in size and less tender.  Cardiac exam revealed the PMI to be normally situated and sized. The rhythm was regular and no extrasystoles were noted during several minutes of auscultation. The first and  second heart sounds were normal and physiologic splitting of the second heart sound was noted. There were no murmurs, rubs, clicks, or gallops.. Examination of the chest was unremarkable. There were no bony deformities, no asymmetry, and no other abnormalities.. Neurologically, the patient was awake, alert, and oriented to person, place and time. There were no obvious focal neurologic abnormalities. Examination of the skin revealed no evidence of significant rashes, suspicious appearing nevi or other concerning lesions.  Lower extremityswelling. Psychiatric system: Patient is   depressed LAB RESULTS:  Infusion on 07/01/2015  Component Date Value Ref Range Status  . WBC 07/01/2015 3.8  3.6 - 11.0 K/uL Final   A-LINE DRAW  . RBC 07/01/2015 3.68* 3.80 - 5.20 MIL/uL Final  . Hemoglobin 07/01/2015 9.9* 12.0 - 16.0 g/dL Final  . HCT 07/01/2015 30.6* 35.0 - 47.0 % Final  . MCV 07/01/2015 83.1  80.0 - 100.0 fL Final  . MCH 07/01/2015 26.8  26.0 - 34.0 pg Final  . MCHC 07/01/2015 32.2  32.0 - 36.0 g/dL Final  . RDW 07/01/2015 16.9* 11.5 - 14.5 % Final  . Platelets 07/01/2015 222  150 - 440 K/uL Final  . Neutrophils Relative % 07/01/2015 74   Final  . Neutro Abs 07/01/2015 2.8  1.4 - 6.5 K/uL Final  . Lymphocytes Relative 07/01/2015 10   Final  . Lymphs Abs 07/01/2015 0.4* 1.0 -  3.6 K/uL Final  . Monocytes Relative 07/01/2015 12   Final  . Monocytes Absolute 07/01/2015 0.5  0.2 - 0.9 K/uL Final  . Eosinophils Relative 07/01/2015 3   Final  . Eosinophils Absolute 07/01/2015 0.1  0 - 0.7 K/uL Final  . Basophils Relative 07/01/2015 1   Final  . Basophils Absolute 07/01/2015 0.0  0 - 0.1 K/uL Final  . Sodium 07/01/2015 131* 135 - 145 mmol/L Final  . Potassium 07/01/2015 4.1  3.5 - 5.1 mmol/L Final  . Chloride 07/01/2015 97* 101 - 111 mmol/L Final  . CO2 07/01/2015 27  22 - 32 mmol/L Final  . Glucose, Bld 07/01/2015 124* 65 - 99 mg/dL Final  . BUN 07/01/2015 21* 6 - 20 mg/dL Final  . Creatinine, Ser 07/01/2015 1.75* 0.44 - 1.00 mg/dL Final  . Calcium 07/01/2015 8.5* 8.9 - 10.3 mg/dL Final  . Total Protein 07/01/2015 6.4* 6.5 - 8.1 g/dL Final  . Albumin 07/01/2015 3.4* 3.5 - 5.0 g/dL Final  . AST 07/01/2015 21  15 - 41 U/L Final  . ALT 07/01/2015 10* 14 - 54 U/L Final  . Alkaline Phosphatase 07/01/2015 78  38 - 126 U/L Final  . Total Bilirubin 07/01/2015 0.6  0.3 - 1.2 mg/dL Final  . GFR calc non Af Amer 07/01/2015 29* >60 mL/min Final  . GFR calc Af Amer 07/01/2015 34* >60 mL/min Final   Comment: (NOTE) The eGFR has been calculated using the CKD EPI equation. This calculation has not been validated in all clinical situations. eGFR's persistently <60 mL/min signify possible Chronic Kidney Disease.   . Anion gap 07/01/2015 7  5 - 15 Final    Lab Results  Component Value Date   CA125 18.8 07/23/2014    ASSESSMENT: Carcinoma of endometrium progressive disease Progressing disease.  Patient is here for further evaluation regarding fourth cycle of NIVOLULAMAB Patient has received 2 units of packed cells hemoglobin is improved Continues to feel weak Will continue chemotherapy Repeat PET scan prior to next appointment Decrease fentanyl patch to 50 g however patient was instructed that if develops continuing back  pain, needs to go back on 75 g of fentanyl  patch  MEDICAL DECISION MAKING:  Back pain has improved significantly.  Will continue fentanyl patch and pain medication with Vicodin. Anemia may be contribution to patient's generalized feeling of weakness will transfuse 1 unit of packed cell and reassess whether patient is feeling any better. Continue chemotherapy   Cancer of uterus   Staging form: Corpus Uteri - Carcinoma, AJCC 7th Edition     Clinical: Stage IVB (T3b, N2, M1) - Signed by Forest Gleason, MD on 04/18/2015   Forest Gleason, MD   07/01/2015 1:37 PM

## 2015-07-01 NOTE — Progress Notes (Signed)
Patient does not have living will.  Never smoked. Patient complains of no energy.  Sleeps a lot.  Not much of an appetite,

## 2015-07-07 ENCOUNTER — Encounter: Payer: Self-pay | Admitting: Oncology

## 2015-07-07 ENCOUNTER — Telehealth: Payer: Self-pay | Admitting: *Deleted

## 2015-07-07 ENCOUNTER — Inpatient Hospital Stay: Payer: Medicare Other

## 2015-07-07 ENCOUNTER — Inpatient Hospital Stay (HOSPITAL_BASED_OUTPATIENT_CLINIC_OR_DEPARTMENT_OTHER): Payer: Medicare Other | Admitting: Oncology

## 2015-07-07 ENCOUNTER — Inpatient Hospital Stay: Payer: Medicare Other | Attending: Oncology

## 2015-07-07 VITALS — BP 130/75 | HR 80 | Temp 96.4°F | Wt 127.0 lb

## 2015-07-07 DIAGNOSIS — C541 Malignant neoplasm of endometrium: Secondary | ICD-10-CM | POA: Diagnosis present

## 2015-07-07 DIAGNOSIS — Z79899 Other long term (current) drug therapy: Secondary | ICD-10-CM

## 2015-07-07 DIAGNOSIS — R630 Anorexia: Secondary | ICD-10-CM | POA: Insufficient documentation

## 2015-07-07 DIAGNOSIS — C55 Malignant neoplasm of uterus, part unspecified: Secondary | ICD-10-CM

## 2015-07-07 LAB — COMPREHENSIVE METABOLIC PANEL
ALK PHOS: 72 U/L (ref 38–126)
ALT: 9 U/L — ABNORMAL LOW (ref 14–54)
ANION GAP: 6 (ref 5–15)
AST: 17 U/L (ref 15–41)
Albumin: 3.4 g/dL — ABNORMAL LOW (ref 3.5–5.0)
BUN: 12 mg/dL (ref 6–20)
CO2: 27 mmol/L (ref 22–32)
Calcium: 8.2 mg/dL — ABNORMAL LOW (ref 8.9–10.3)
Chloride: 97 mmol/L — ABNORMAL LOW (ref 101–111)
Creatinine, Ser: 1.42 mg/dL — ABNORMAL HIGH (ref 0.44–1.00)
GFR calc Af Amer: 44 mL/min — ABNORMAL LOW (ref >60)
GFR calc non Af Amer: 38 mL/min — ABNORMAL LOW (ref >60)
GLUCOSE: 115 mg/dL — AB (ref 65–99)
POTASSIUM: 4 mmol/L (ref 3.5–5.1)
Sodium: 130 mmol/L — ABNORMAL LOW (ref 135–145)
TOTAL PROTEIN: 6.5 g/dL (ref 6.5–8.1)
Total Bilirubin: 0.4 mg/dL (ref 0.3–1.2)

## 2015-07-07 LAB — CBC WITH DIFFERENTIAL/PLATELET
Basophils Absolute: 0 10*3/uL (ref 0–0.1)
Basophils Relative: 1 %
EOS PCT: 3 %
Eosinophils Absolute: 0.1 10*3/uL (ref 0–0.7)
HCT: 30.3 % — ABNORMAL LOW (ref 35.0–47.0)
Hemoglobin: 9.7 g/dL — ABNORMAL LOW (ref 12.0–16.0)
Lymphocytes Relative: 11 %
Lymphs Abs: 0.5 10*3/uL — ABNORMAL LOW (ref 1.0–3.6)
MCH: 26.4 pg (ref 26.0–34.0)
MCHC: 32.1 g/dL (ref 32.0–36.0)
MCV: 82.4 fL (ref 80.0–100.0)
Monocytes Absolute: 0.4 10*3/uL (ref 0.2–0.9)
Monocytes Relative: 9 %
Neutro Abs: 3.7 10*3/uL (ref 1.4–6.5)
Neutrophils Relative %: 76 %
Platelets: 238 10*3/uL (ref 150–440)
RBC: 3.68 MIL/uL — AB (ref 3.80–5.20)
RDW: 16.7 % — ABNORMAL HIGH (ref 11.5–14.5)
WBC: 4.9 10*3/uL (ref 3.6–11.0)

## 2015-07-07 MED ORDER — HEPARIN SOD (PORK) LOCK FLUSH 100 UNIT/ML IV SOLN
500.0000 [IU] | Freq: Once | INTRAVENOUS | Status: AC
  Administered 2015-07-07: 500 [IU] via INTRAVENOUS
  Filled 2015-07-07: qty 5

## 2015-07-07 MED ORDER — SODIUM CHLORIDE 0.9 % IV SOLN
Freq: Once | INTRAVENOUS | Status: AC
  Administered 2015-07-07: 12:00:00 via INTRAVENOUS
  Filled 2015-07-07: qty 1000

## 2015-07-07 MED ORDER — ONDANSETRON HCL 40 MG/20ML IJ SOLN
Freq: Once | INTRAMUSCULAR | Status: AC
  Administered 2015-07-07: 12:00:00 via INTRAVENOUS
  Filled 2015-07-07: qty 4

## 2015-07-07 MED ORDER — SODIUM CHLORIDE 0.9 % IJ SOLN
10.0000 mL | INTRAMUSCULAR | Status: DC | PRN
  Administered 2015-07-07: 10 mL via INTRAVENOUS
  Filled 2015-07-07: qty 10

## 2015-07-07 MED ORDER — MEGESTROL ACETATE 400 MG/10ML PO SUSP: 400.0000 mg | Freq: Every day | ORAL | Status: DC

## 2015-07-07 MED ORDER — METOCLOPRAMIDE HCL 5 MG/ML IJ SOLN
10.0000 mg | Freq: Once | INTRAMUSCULAR | Status: AC
  Administered 2015-07-07: 10 mg via INTRAVENOUS
  Filled 2015-07-07: qty 2

## 2015-07-07 NOTE — Telephone Encounter (Signed)
Called in megace per md request, pt aware it was going to be called in.

## 2015-07-07 NOTE — Progress Notes (Signed)
[place] @ [facility] [phone]) [phone]  Fax:(336) [phone]     Sherree Shankman OB: 29-Jul-1951  MR#: [id number]  LKT#:[id number]  [name], MD as PCP - General (Family Medicine) [name], MD (General Surgery)  CHIEF COMPLAINT:  Chief Complaint  Patient presents with  . Acute Visit    Oncology History   Chief Complaint/Diagnosis:   The patient is a 64 year old with metastatic endometrial cancer who is seen for assessment during ongoing palliative radiation therapy.  04/2011   papillary serous adenocarcinoma of the endometrium, stage IIIb (nodal metastasis)              TAHBSO and full staging,               GOG protocl using XRT and chemotherapy, significant XRT side-effects,               continued on chemotherapy off protocol, 04/2013   recurrence in the nodal areas,              DDP and docetaxel x 6 completed in December,   started on chemotherapy with Adriamycin(October, 2015) poor tolerance to letrozole  and affinator Estrogen and progesterone receptor negative tumor         hER-2/neu recetor negative. November 07, 2014 progressive disease by  tumor marker criteria and clinical examination November 12, 2014 After 6 cycles of chemotherapy because of increasing side effect chemotherapy was discontinued.  (March, 2016) Patient was started on cis-platinum and topotecan. May, 2016 Cis-platinum into Portec and was discontinued because of significant side effect and possibility of progressive disease.  Patient would be started on NIVOLULAMAB on compassionate use basis     Cancer of uterus   04/18/2015 Initial Diagnosis Cancer of uterus    Oncology Flowsheet 05/06/2015 05/20/2015 06/03/2015 06/17/2015 06/18/2015 07/01/2015 07/07/2015  diphenhydrAMINE (BENADRYL) PO - - - - 25 mg - -  metoCLOPramide (REGLAN) IV - - - - - - 10 mg  nivolumab (OPDIVO) IV 180 mg 180 mg 180 mg 180 mg - 180 mg -  ondansetron (ZOFRAN) IV - - - - - - [ 8 mg ]     INTERVAL HISTORY: 64 year old lady with stage IV carcinoma of endometrium metastases to the lymph node.  Patient is here for ongoing evaluation and treatment consideration No chills.  No fever.  Feeling extremely weak and tired.  Patient had constipation followed by black stool.  No epigastric discomfort.  Poor appetite. Patient was also off fentanyl patch by accident and remained off for 24 hours  REVIEW OF SYSTEMS:    general status: Patient is feeling weak and tired.  No change in a performance status.  No chills.  No fever.  Feeling extremely weak and tired HEENT: Alopecia.  No evidence of stomatitis increasing discomfort on the left side of the neck Lungs: No cough or shortness of breath Cardiac: No chest pain or paroxysmal nocturnal dyspnea GI: No nausea no vomiting no diarrhea no abdominal pain On stay patient followed by black stool. Skin: No rash Lower extremity no swelling Neurological system: No tingling.  No numbness.  No other focal signs Musculoskeletal system no bony pains poor appetite has lost approximately 5 pounds in last month  PAST MEDICAL HISTORY: Past Medical History  Diagnosis Date  . Cancer 2012  . GI problem 2013  . Cancer of uterus 04/18/2015    PAST SURGICAL HISTORY: Past Surgical History  Procedure Laterality Date  . Abdominal hysterectomy  FAMILY HISTORY No significant family history.      ADVANCED DIRECTIVES: Patient does have advance directive   HEALTH MAINTENANCE: History  Substance Use Topics  . Smoking status: Never Smoker   . Smokeless tobacco: Never Used  . Alcohol Use: Yes      Allergies  Allergen Reactions  . Penicillins Rash    Current Outpatient Prescriptions  Medication Sig Dispense Refill  . ALPRAZolam (XANAX) 0.25 MG tablet Take 0.25 mg by mouth 2 (two) times daily as needed for anxiety.     . bismuth subsalicylate (PEPTO BISMOL) 262 MG chewable tablet Chew 524 mg by mouth as needed for indigestion.     .  cholestyramine (QUESTRAN) 4 G packet Take 1 packet by mouth 3 (three) times daily as needed.     . fentaNYL (DURAGESIC - DOSED MCG/HR) 50 MCG/HR Place 1 patch (50 mcg total) onto the skin every 3 (three) days. 10 patch 0  . fexofenadine (ALLEGRA) 180 MG tablet Take 180 mg by mouth as needed.     . fluticasone (FLONASE) 50 MCG/ACT nasal spray Place 1-2 sprays into the nose daily as needed.    Marland Kitchen HYDROcodone-acetaminophen (NORCO/VICODIN) 5-325 MG per tablet Take 1 tablet by mouth every 6 (six) hours as needed for moderate pain. 60 tablet 0  . lidocaine-prilocaine (EMLA) cream Apply to affected area once 30 g 3  . metoCLOPramide (REGLAN) 10 MG tablet Take 10 mg by mouth 4 (four) times daily.    Marland Kitchen omeprazole (PRILOSEC) 20 MG capsule     . ondansetron (ZOFRAN) 4 MG tablet TAKE 1 TABLET BY MOUTH EVERY 6 HOURS AS NEEDED 60 tablet 0  . polyethylene glycol powder (GLYCOLAX/MIRALAX) powder Take by mouth.    . potassium chloride (K-DUR) 10 MEQ tablet Take 1 tablet (10 mEq total) by mouth daily. 30 tablet 3  . predniSONE (DELTASONE) 10 MG tablet Take 1 tab po x 14 days, then take 0.5 tab every other day for 2 weeks then stop 18 tablet 0  . ranitidine (ZANTAC) 150 MG tablet Take 1 tablet by mouth 2 (two) times daily.    . sertraline (ZOLOFT) 25 MG tablet Take 25 mg by mouth daily.    . citalopram (CELEXA) 40 MG tablet Take 1 tablet (40 mg total) by mouth daily. (Patient not taking: Reported on 07/07/2015) 30 tablet 3  . megestrol (MEGACE) 400 MG/10ML suspension Take 10 mLs (400 mg total) by mouth daily. 300 mL 0   No current facility-administered medications for this visit.   Facility-Administered Medications Ordered in Other Visits  Medication Dose Route Frequency Provider Last Rate Last Dose  . sodium chloride 0.9 % injection 10 mL  10 mL Intracatheter PRN Forest Gleason, MD   10 mL at 05/06/15 1028  . sodium chloride 0.9 % injection 10 mL  10 mL Intravenous PRN Forest Gleason, MD      . sodium chloride 0.9 %  injection 10 mL  10 mL Intravenous PRN Forest Gleason, MD   10 mL at 07/07/15 1000    OBJECTIVE:  Filed Vitals:   07/07/15 1036  BP: 130/75  Pulse: 80  Temp: 96.4 F (35.8 C)     Body mass index is 22.5 kg/(m^2).    ECOG FS:1 - Symptomatic but completely ambulatory  PHYSICAL EXAM: Gen. status: Alert and oriented lady not any acute distress Patient is very emotional with frequent crying spells Eyes: Pale-looking sclera and conjunctiva HEENT: Left supraclavicular  Mass .  Mass appears to decrease in size  and less tender.  Cardiac exam revealed the PMI to be normally situated and sized. The rhythm was regular and no extrasystoles were noted during several minutes of auscultation. The first and second heart sounds were normal and physiologic splitting of the second heart sound was noted. There were no murmurs, rubs, clicks, or gallops.. Examination of the chest was unremarkable. There were no bony deformities, no asymmetry, and no other abnormalities.. Neurologically, the patient was awake, alert, and oriented to person, place and time. There were no obvious focal neurologic abnormalities. Examination of the skin revealed no evidence of significant rashes, suspicious appearing nevi or other concerning lesions.  Lower extremityswelling. Psychiatric system: Patient is   depressed LAB RESULTS:  Infusion on 07/07/2015  Component Date Value Ref Range Status  . WBC 07/07/2015 4.9  3.6 - 11.0 K/uL Final   A-LINE DRAW  . RBC 07/07/2015 3.68* 3.80 - 5.20 MIL/uL Final  . Hemoglobin 07/07/2015 9.7* 12.0 - 16.0 g/dL Final  . HCT 07/07/2015 30.3* 35.0 - 47.0 % Final  . MCV 07/07/2015 82.4  80.0 - 100.0 fL Final  . MCH 07/07/2015 26.4  26.0 - 34.0 pg Final  . MCHC 07/07/2015 32.1  32.0 - 36.0 g/dL Final  . RDW 07/07/2015 16.7* 11.5 - 14.5 % Final  . Platelets 07/07/2015 238  150 - 440 K/uL Final  . Neutrophils Relative % 07/07/2015 76   Final  . Neutro Abs 07/07/2015 3.7  1.4 - 6.5 K/uL Final  .  Lymphocytes Relative 07/07/2015 11   Final  . Lymphs Abs 07/07/2015 0.5* 1.0 - 3.6 K/uL Final  . Monocytes Relative 07/07/2015 9   Final  . Monocytes Absolute 07/07/2015 0.4  0.2 - 0.9 K/uL Final  . Eosinophils Relative 07/07/2015 3   Final  . Eosinophils Absolute 07/07/2015 0.1  0 - 0.7 K/uL Final  . Basophils Relative 07/07/2015 1   Final  . Basophils Absolute 07/07/2015 0.0  0 - 0.1 K/uL Final  . Sodium 07/07/2015 130* 135 - 145 mmol/L Final  . Potassium 07/07/2015 4.0  3.5 - 5.1 mmol/L Final  . Chloride 07/07/2015 97* 101 - 111 mmol/L Final  . CO2 07/07/2015 27  22 - 32 mmol/L Final  . Glucose, Bld 07/07/2015 115* 65 - 99 mg/dL Final  . BUN 07/07/2015 12  6 - 20 mg/dL Final  . Creatinine, Ser 07/07/2015 1.42* 0.44 - 1.00 mg/dL Final  . Calcium 07/07/2015 8.2* 8.9 - 10.3 mg/dL Final  . Total Protein 07/07/2015 6.5  6.5 - 8.1 g/dL Final  . Albumin 07/07/2015 3.4* 3.5 - 5.0 g/dL Final  . AST 07/07/2015 17  15 - 41 U/L Final  . ALT 07/07/2015 9* 14 - 54 U/L Final  . Alkaline Phosphatase 07/07/2015 72  38 - 126 U/L Final  . Total Bilirubin 07/07/2015 0.4  0.3 - 1.2 mg/dL Final  . GFR calc non Af Amer 07/07/2015 38* >60 mL/min Final  . GFR calc Af Amer 07/07/2015 44* >60 mL/min Final   Comment: (NOTE) The eGFR has been calculated using the CKD EPI equation. This calculation has not been validated in all clinical situations. eGFR's persistently <60 mL/min signify possible Chronic Kidney Disease.   . Anion gap 07/07/2015 6  5 - 15 Final    Lab Results  Component Value Date   CA125 18.8 07/23/2014    ASSESSMENT: Carcinoma of endometrium progressive disease Gen. status is poor.  Patient's appetite is poor so Megace was added 400 mg twice a day Some of  the symptoms Patient complaining may be related to withdrawal from fentanyl patch which accidentally fell off Eugenie Birks give patient intravenous fluid A black stool if it continues then possibility of upper and endoscopy would be  considered patient was advised not to take any ibuprofen and aspirin.  Patient is taking Prilosec twice a day which would be continued. Hemoglobin will be rechecked MEDICAL DECISION MAKING:   All lab data has been reviewed Black stool would be observed.  Upper GI bleeding suspected.  Patient was advised to stop ibuprofen Intravenous fluid Megace as appetite stimulant  Cancer of uterus   Staging form: Corpus Uteri - Carcinoma, AJCC 7th Edition     Clinical: Stage IVB (T3b, N2, M1) - Signed by Forest Gleason, MD on 04/18/2015   Forest Gleason, MD   07/07/2015 1:15 PM

## 2015-07-07 NOTE — Progress Notes (Signed)
Patient does have living will.  Never smoked.  Patient acute add on today for constipation.  Used suppository.  Had Arletta Lumadue stool.  Vomited yesterday.  Complains of "splitting h/a today".

## 2015-07-14 ENCOUNTER — Encounter
Admission: RE | Admit: 2015-07-14 | Discharge: 2015-07-14 | Disposition: A | Discharge status: Home / Self Care | Payer: Medicare Other | Source: Ambulatory Visit | Attending: Oncology | Admitting: Oncology

## 2015-07-14 DIAGNOSIS — C55 Malignant neoplasm of uterus, part unspecified: Secondary | ICD-10-CM | POA: Insufficient documentation

## 2015-07-14 LAB — GLUCOSE, CAPILLARY: Glucose-Capillary: 83 mg/dL (ref 65–99)

## 2015-07-14 MED ORDER — FLUDEOXYGLUCOSE F - 18 (FDG) INJECTION
12.6100 | Freq: Once | INTRAVENOUS | Status: AC | PRN
  Administered 2015-07-14: 12.61 via INTRAVENOUS

## 2015-07-15 ENCOUNTER — Inpatient Hospital Stay: Payer: Medicare Other

## 2015-07-15 ENCOUNTER — Inpatient Hospital Stay (HOSPITAL_BASED_OUTPATIENT_CLINIC_OR_DEPARTMENT_OTHER): Payer: Medicare Other | Admitting: Oncology

## 2015-07-15 ENCOUNTER — Encounter: Payer: Self-pay | Admitting: Oncology

## 2015-07-15 VITALS — BP 160/72 | HR 75 | Temp 96.5°F | Wt 129.1 lb

## 2015-07-15 DIAGNOSIS — R630 Anorexia: Secondary | ICD-10-CM

## 2015-07-15 DIAGNOSIS — Z79899 Other long term (current) drug therapy: Secondary | ICD-10-CM

## 2015-07-15 DIAGNOSIS — C55 Malignant neoplasm of uterus, part unspecified: Secondary | ICD-10-CM

## 2015-07-15 DIAGNOSIS — C541 Malignant neoplasm of endometrium: Secondary | ICD-10-CM

## 2015-07-15 DIAGNOSIS — Z8542 Personal history of malignant neoplasm of other parts of uterus: Secondary | ICD-10-CM

## 2015-07-15 LAB — CBC WITH DIFFERENTIAL/PLATELET
Basophils Absolute: 0.1 10*3/uL (ref 0–0.1)
Basophils Relative: 1 %
EOS ABS: 0.1 10*3/uL (ref 0–0.7)
Eosinophils Relative: 1 %
HCT: 29.6 % — ABNORMAL LOW (ref 35.0–47.0)
Hemoglobin: 9.6 g/dL — ABNORMAL LOW (ref 12.0–16.0)
Lymphocytes Relative: 11 %
Lymphs Abs: 0.9 10*3/uL — ABNORMAL LOW (ref 1.0–3.6)
MCH: 26.7 pg (ref 26.0–34.0)
MCHC: 32.4 g/dL (ref 32.0–36.0)
MCV: 82.3 fL (ref 80.0–100.0)
MONOS PCT: 7 %
Monocytes Absolute: 0.6 10*3/uL (ref 0.2–0.9)
Neutro Abs: 6.6 10*3/uL — ABNORMAL HIGH (ref 1.4–6.5)
Neutrophils Relative %: 80 %
Platelets: 278 10*3/uL (ref 150–440)
RBC: 3.59 MIL/uL — ABNORMAL LOW (ref 3.80–5.20)
RDW: 17 % — ABNORMAL HIGH (ref 11.5–14.5)
WBC: 8.3 10*3/uL (ref 3.6–11.0)

## 2015-07-15 LAB — COMPREHENSIVE METABOLIC PANEL
ALT: 11 U/L — ABNORMAL LOW (ref 14–54)
ANION GAP: 8 (ref 5–15)
AST: 18 U/L (ref 15–41)
Albumin: 3.3 g/dL — ABNORMAL LOW (ref 3.5–5.0)
Alkaline Phosphatase: 80 U/L (ref 38–126)
BILIRUBIN TOTAL: 0.3 mg/dL (ref 0.3–1.2)
BUN: 27 mg/dL — ABNORMAL HIGH (ref 6–20)
CO2: 22 mmol/L (ref 22–32)
Calcium: 8.1 mg/dL — ABNORMAL LOW (ref 8.9–10.3)
Chloride: 98 mmol/L — ABNORMAL LOW (ref 101–111)
Creatinine, Ser: 1.2 mg/dL — ABNORMAL HIGH (ref 0.44–1.00)
GFR calc Af Amer: 53 mL/min — ABNORMAL LOW (ref >60)
GFR, EST NON AFRICAN AMERICAN: 46 mL/min — AB (ref >60)
GLUCOSE: 134 mg/dL — AB (ref 65–99)
POTASSIUM: 3.8 mmol/L (ref 3.5–5.1)
Sodium: 128 mmol/L — ABNORMAL LOW (ref 135–145)
Total Protein: 6.6 g/dL (ref 6.5–8.1)

## 2015-07-15 MED ORDER — SODIUM CHLORIDE 0.9 % IJ SOLN
10.0000 mL | INTRAMUSCULAR | Status: DC | PRN
  Administered 2015-07-15: 10 mL
  Filled 2015-07-15: qty 10

## 2015-07-15 MED ORDER — MEGESTROL ACETATE 400 MG/10ML PO SUSP: 400.0000 mg | Freq: Every day | ORAL | Status: DC

## 2015-07-15 MED ORDER — SODIUM CHLORIDE 0.9 % IV SOLN
Freq: Once | INTRAVENOUS | Status: AC
Start: 2015-07-15 — End: 2015-07-15
  Administered 2015-07-15: 11:00:00 via INTRAVENOUS
  Filled 2015-07-15: qty 1000

## 2015-07-15 MED ORDER — SODIUM CHLORIDE 0.9 % IV SOLN
Freq: Once | INTRAVENOUS | Status: AC
Start: 2015-07-15 — End: 2015-07-15
  Administered 2015-07-15: 12:00:00 via INTRAVENOUS
  Filled 2015-07-15: qty 1000

## 2015-07-15 MED ORDER — HEPARIN SOD (PORK) LOCK FLUSH 100 UNIT/ML IV SOLN
500.0000 [IU] | Freq: Once | INTRAVENOUS | Status: AC | PRN
  Administered 2015-07-15: 500 [IU]
  Filled 2015-07-15: qty 5

## 2015-07-15 MED ORDER — NIVOLUMAB CHEMO INJECTION 100 MG/10ML
180.0000 mg | Freq: Once | INTRAVENOUS | Status: AC
  Administered 2015-07-15: 180 mg via INTRAVENOUS
  Filled 2015-07-15 (×4): qty 18

## 2015-07-15 MED ORDER — HYDROCODONE-ACETAMINOPHEN 5-325 MG PO TABS: 1.0000 | ORAL_TABLET | Freq: Four times a day (QID) | ORAL | Status: DC | PRN

## 2015-07-15 NOTE — Progress Notes (Signed)
Patient does have living will.  Never smoked. Patient states appetite has improved.  Complains of getting tired easily.

## 2015-07-16 NOTE — Progress Notes (Signed)
[place] @ [facility] [phone]  [phone]     Darinda [place] OB: 11-25-1951  MR#: [id number]  QIO#:[id number]  [name], MD as PCP - General (Family Medicine) [name], MD (General Surgery)  CHIEF COMPLAINT:  Chief Complaint  Patient presents with  . Follow-up    Oncology History   Chief Complaint/Diagnosis:   The patient is a 64 year old with metastatic endometrial cancer who is seen for assessment during ongoing palliative radiation therapy.  04/2011   papillary serous adenocarcinoma of the endometrium, stage IIIb (nodal metastasis)              TAHBSO and full staging,               GOG protocl using XRT and chemotherapy, significant XRT side-effects,               continued on chemotherapy off protocol, 04/2013   recurrence in the nodal areas,              DDP and docetaxel x 6 completed in December,   started on chemotherapy with Adriamycin(October, 2015) poor tolerance to letrozole  and affinator Estrogen and progesterone receptor negative tumor         hER-2/neu recetor negative. November 07, 2014 progressive disease by  tumor marker criteria and clinical examination November 12, 2014 After 6 cycles of chemotherapy because of increasing side effect chemotherapy was discontinued.  (March, 2016) Patient was started on cis-platinum and topotecan. May, 2016 Cis-platinum into Portec and was discontinued because of significant side effect and possibility of progressive disease.  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  Patient is here for ongoing evaluation and treatment consideration No chills.  No fever.  Feeling extremely weak and tired.  Patient had constipation followed by black stool.  No epigastric discomfort.  Poor appetite. July 15, 2015 Patient's appetite is improved on Megace. Feeling somewhat better as gained 3 pounds of weight.  Left supraclavicular lymph nodes and decrease in size.  Patient had a PET scan done which revealed significant decrease in the size of the lymph node.  Here for further follow-up and treatment consideration  REVIEW OF SYSTEMS:    general status: Patient is feeling weak and tired.  No change in a performance status.  No chills.  No fever.  Feeling extremely weak and tired HEENT: Alopecia.  No evidence of stomatitis increasing discomfort on the left side of the neck Lungs: No cough or shortness of breath Cardiac: No chest pain or paroxysmal nocturnal dyspnea GI: No nausea no vomiting no diarrhea no abdominal pain On stay patient followed by black stool. Skin: No rash Lower extremity no swelling Neurological system: No tingling.  No numbness.  No other focal signs Musculoskeletal system no bony pains poor appetite has lost approximately 5 pounds in last month Appetite is improving.  Gained 3 pounds  of weight.  PAST MEDICAL HISTORY: Past Medical History  Diagnosis Date  . Cancer 2012  . GI problem 2013  . Cancer of uterus 04/18/2015    PAST SURGICAL HISTORY: Past Surgical History  Procedure Laterality Date  . Abdominal hysterectomy      FAMILY HISTORY No significant family history.      ADVANCED DIRECTIVES: Patient does have advance directive   HEALTH MAINTENANCE: Social History  Substance Use Topics  . Smoking status: Never Smoker   . Smokeless tobacco: Never Used  . Alcohol Use: Yes      Allergies  Allergen Reactions  . Penicillins  Rash    Current Outpatient Prescriptions  Medication Sig Dispense Refill  . ALPRAZolam (XANAX) 0.25 MG tablet Take 0.25 mg by mouth 2 (two) times daily as needed for anxiety.     . bismuth subsalicylate (PEPTO BISMOL) 262 MG chewable tablet Chew 524 mg by mouth as needed for indigestion.     . cholestyramine (QUESTRAN) 4 G packet Take 1 packet by mouth 3 (three) times daily as needed.     . citalopram (CELEXA) 40 MG tablet Take 1 tablet (40 mg total) by mouth daily. 30 tablet 3  . fentaNYL (DURAGESIC - DOSED MCG/HR) 50 MCG/HR Place 1 patch (50 mcg total) onto the skin every 3 (three) days. 10 patch 0  . fexofenadine (ALLEGRA) 180 MG tablet Take 180 mg by mouth as needed.     . fluticasone (FLONASE) 50 MCG/ACT nasal spray Place 1-2 sprays into the nose daily as needed.    Marland Kitchen HYDROcodone-acetaminophen (NORCO/VICODIN) 5-325 MG per tablet Take 1 tablet by mouth every 6 (six) hours as needed for moderate pain. 60 tablet 0  . lidocaine-prilocaine (EMLA) cream Apply to affected area once 30 g 3  . megestrol (MEGACE) 400 MG/10ML suspension Take 10 mLs (400 mg total) by mouth daily. 300 mL 6  . metoCLOPramide (REGLAN) 10 MG tablet Take 10 mg by mouth 4 (four) times daily.    Marland Kitchen omeprazole (PRILOSEC) 20 MG capsule     . ondansetron (ZOFRAN) 4 MG tablet TAKE 1 TABLET BY MOUTH EVERY 6 HOURS AS NEEDED 60 tablet 0  . polyethylene glycol powder (GLYCOLAX/MIRALAX) powder Take by mouth.    . potassium chloride (K-DUR) 10 MEQ tablet Take 1 tablet (10 mEq total) by mouth daily. 30 tablet 3  . predniSONE (DELTASONE) 10 MG tablet Take 1 tab po x 14 days, then take 0.5 tab every other day for 2 weeks then stop 18 tablet 0  . ranitidine (ZANTAC) 150 MG tablet Take 1 tablet by mouth 2 (two) times daily.    . sertraline (ZOLOFT) 25 MG tablet Take 25 mg by mouth daily.     No current facility-administered medications for this visit.   Facility-Administered Medications Ordered in Other Visits  Medication Dose Route  Frequency Provider Last Rate Last Dose  . sodium chloride 0.9 % injection 10 mL  10 mL Intracatheter PRN Forest Gleason, MD   10 mL at 05/06/15 1028  . sodium chloride 0.9 % injection 10 mL  10 mL Intravenous PRN Forest Gleason, MD        OBJECTIVE:  Filed Vitals:   07/15/15 1009  BP: 160/72  Pulse: 75  Temp: 96.5 F (35.8 C)     Body mass index is 22.88 kg/(m^2).    ECOG FS:1 - Symptomatic but completely ambulatory  PHYSICAL EXAM: Gen. status: Alert and oriented lady not any acute distress Patient is very emotional with frequent  crying spells Eyes: Pale-looking sclera and conjunctiva HEENT: Left supraclavicular  Mass .  Mass appears to decrease in size and less tender.  Cardiac exam revealed the PMI to be normally situated and sized. The rhythm was regular and no extrasystoles were noted during several minutes of auscultation. The first and second heart sounds were normal and physiologic splitting of the second heart sound was noted. There were no murmurs, rubs, clicks, or gallops.. Examination of the chest was unremarkable. There were no bony deformities, no asymmetry, and no other abnormalities.. Neurologically, the patient was awake, alert, and oriented to person, place and time. There were no obvious focal neurologic abnormalities. Examination of the skin revealed no evidence of significant rashes, suspicious appearing nevi or other concerning lesions.  Lower extremityswelling. Psychiatric system: Patient is   depressed LAB RESULTS:  Infusion on 07/15/2015  Component Date Value Ref Range Status  . WBC 07/15/2015 8.3  3.6 - 11.0 K/uL Final   A-LINE DRAW  . RBC 07/15/2015 3.59* 3.80 - 5.20 MIL/uL Final  . Hemoglobin 07/15/2015 9.6* 12.0 - 16.0 g/dL Final  . HCT 07/15/2015 29.6* 35.0 - 47.0 % Final  . MCV 07/15/2015 82.3  80.0 - 100.0 fL Final  . MCH 07/15/2015 26.7  26.0 - 34.0 pg Final  . MCHC 07/15/2015 32.4  32.0 - 36.0 g/dL Final  . RDW 07/15/2015 17.0* 11.5 - 14.5 % Final   . Platelets 07/15/2015 278  150 - 440 K/uL Final  . Neutrophils Relative % 07/15/2015 80   Final  . Neutro Abs 07/15/2015 6.6* 1.4 - 6.5 K/uL Final  . Lymphocytes Relative 07/15/2015 11   Final  . Lymphs Abs 07/15/2015 0.9* 1.0 - 3.6 K/uL Final  . Monocytes Relative 07/15/2015 7   Final  . Monocytes Absolute 07/15/2015 0.6  0.2 - 0.9 K/uL Final  . Eosinophils Relative 07/15/2015 1   Final  . Eosinophils Absolute 07/15/2015 0.1  0 - 0.7 K/uL Final  . Basophils Relative 07/15/2015 1   Final  . Basophils Absolute 07/15/2015 0.1  0 - 0.1 K/uL Final  . Sodium 07/15/2015 128* 135 - 145 mmol/L Final  . Potassium 07/15/2015 3.8  3.5 - 5.1 mmol/L Final  . Chloride 07/15/2015 98* 101 - 111 mmol/L Final  . CO2 07/15/2015 22  22 - 32 mmol/L Final  . Glucose, Bld 07/15/2015 134* 65 - 99 mg/dL Final  . BUN 07/15/2015 27* 6 - 20 mg/dL Final  . Creatinine, Ser 07/15/2015 1.20* 0.44 - 1.00 mg/dL Final  . Calcium 07/15/2015 8.1* 8.9 - 10.3 mg/dL Final  . Total Protein 07/15/2015 6.6  6.5 - 8.1 g/dL Final  . Albumin 07/15/2015 3.3* 3.5 - 5.0 g/dL Final  . AST 07/15/2015 18  15 - 41 U/L Final  . ALT 07/15/2015 11* 14 - 54 U/L Final  . Alkaline Phosphatase 07/15/2015 80  38 - 126 U/L Final  . Total Bilirubin 07/15/2015 0.3  0.3 - 1.2 mg/dL Final  . GFR calc non Af Amer 07/15/2015 46* >60 mL/min Final  . GFR calc Af Amer 07/15/2015 53* >60 mL/min Final   Comment: (NOTE) The eGFR has been calculated using the CKD EPI equation. This calculation has not been validated in all clinical situations. eGFR's persistently <60 mL/min signify possible Chronic Kidney Disease.   Georgiann Hahn gap 07/15/2015 8  5 - 15 Final  Hospital Outpatient Visit on 07/14/2015  Component Date Value Ref Range Status  . Glucose-Capillary 07/14/2015 83  65 - 99 mg/dL Final  Lab Results  Component Value Date   CA125 18.8 07/23/2014    ASSESSMENT: Carcinoma of endometrium progressive disease PET scan has been reviewed  dependently and compared with the previous PET scan.  After new or Cleda Daub patient has demonstrative decrease in the size of the lymph node in the retroperitoneal area with improvement in the pain. Fentanyl patch has been decreased. Megace has helped with appetite.  MEDICAL DECISION MAKING:   All lab data has been reviewed Continue Megace. Continue new or Leyla mapped in view of PET scan showing creases in the size of lymphadenopathy and symptomatically patient is up improved.  Patient reporting black stool may be due to Pepto-Bismol small as hemoglobin is stable Discuss all these findings and reviewed PET scan with the patient. Cancer of uterus   Staging form: Corpus Uteri - Carcinoma, AJCC 7th Edition     Clinical: Stage IVB (T3b, N2, M1) - Signed by Forest Gleason, MD on 04/18/2015   Forest Gleason, MD   07/16/2015 9:21 AM

## 2015-07-26 ENCOUNTER — Other Ambulatory Visit: Payer: Self-pay | Admitting: Oncology

## 2015-07-28 ENCOUNTER — Other Ambulatory Visit: Payer: Self-pay | Admitting: Oncology

## 2015-07-29 ENCOUNTER — Inpatient Hospital Stay: Payer: Medicare Other

## 2015-07-29 VITALS — BP 121/73 | HR 72 | Temp 98.3°F

## 2015-07-29 DIAGNOSIS — C541 Malignant neoplasm of endometrium: Secondary | ICD-10-CM | POA: Diagnosis not present

## 2015-07-29 DIAGNOSIS — C55 Malignant neoplasm of uterus, part unspecified: Secondary | ICD-10-CM

## 2015-07-29 LAB — CBC WITH DIFFERENTIAL/PLATELET
BASOS PCT: 1 %
Basophils Absolute: 0 10*3/uL (ref 0–0.1)
Eosinophils Absolute: 0.2 10*3/uL (ref 0–0.7)
Eosinophils Relative: 3 %
HCT: 29.9 % — ABNORMAL LOW (ref 35.0–47.0)
HEMOGLOBIN: 9.9 g/dL — AB (ref 12.0–16.0)
LYMPHS ABS: 0.7 10*3/uL — AB (ref 1.0–3.6)
Lymphocytes Relative: 13 %
MCH: 27.1 pg (ref 26.0–34.0)
MCHC: 33 g/dL (ref 32.0–36.0)
MCV: 82.2 fL (ref 80.0–100.0)
Monocytes Absolute: 0.5 10*3/uL (ref 0.2–0.9)
Monocytes Relative: 9 %
Neutro Abs: 4.1 10*3/uL (ref 1.4–6.5)
Neutrophils Relative %: 74 %
Platelets: 275 10*3/uL (ref 150–440)
RBC: 3.64 MIL/uL — AB (ref 3.80–5.20)
RDW: 17.9 % — ABNORMAL HIGH (ref 11.5–14.5)
WBC: 5.5 10*3/uL (ref 3.6–11.0)

## 2015-07-29 MED ORDER — SODIUM CHLORIDE 0.9 % IV SOLN
180.0000 mg | Freq: Once | INTRAVENOUS | Status: AC
  Administered 2015-07-29: 180 mg via INTRAVENOUS
  Filled 2015-07-29: qty 18

## 2015-07-29 MED ORDER — HEPARIN SOD (PORK) LOCK FLUSH 100 UNIT/ML IV SOLN
500.0000 [IU] | Freq: Once | INTRAVENOUS | Status: AC | PRN
  Administered 2015-07-29: 500 [IU]
  Filled 2015-07-29: qty 5

## 2015-07-29 MED ORDER — SODIUM CHLORIDE 0.9 % IV SOLN
INTRAVENOUS | Status: DC
  Administered 2015-07-29: 10:00:00 via INTRAVENOUS
  Filled 2015-07-29: qty 1000

## 2015-07-29 MED ORDER — SODIUM CHLORIDE 0.9 % IJ SOLN
10.0000 mL | INTRAMUSCULAR | Status: DC | PRN
  Administered 2015-07-29: 10 mL
  Filled 2015-07-29: qty 10

## 2015-07-29 MED ORDER — SODIUM CHLORIDE 0.9 % IV SOLN
Freq: Once | INTRAVENOUS | Status: AC
  Administered 2015-07-29: 11:00:00 via INTRAVENOUS
  Filled 2015-07-29: qty 1000

## 2015-07-30 ENCOUNTER — Telehealth: Payer: Self-pay | Admitting: *Deleted

## 2015-07-30 DIAGNOSIS — C55 Malignant neoplasm of uterus, part unspecified: Secondary | ICD-10-CM

## 2015-07-30 MED ORDER — FENTANYL 50 MCG/HR TD PT72: 50.0000 ug | MEDICATED_PATCH | TRANSDERMAL | Status: DC

## 2015-07-30 NOTE — Telephone Encounter (Signed)
Pt needs refill on fentanyl patch. Prescription ready for pick up. Pt aware that prescription will be at registration desk.

## 2015-08-05 ENCOUNTER — Telehealth: Payer: Self-pay | Admitting: *Deleted

## 2015-08-05 DIAGNOSIS — C55 Malignant neoplasm of uterus, part unspecified: Secondary | ICD-10-CM

## 2015-08-05 MED ORDER — ALPRAZOLAM 0.25 MG PO TABS: 0.2500 mg | ORAL_TABLET | Freq: Two times a day (BID) | ORAL | Status: DC | PRN

## 2015-08-05 MED ORDER — ONDANSETRON HCL 4 MG PO TABS: 4.0000 mg | ORAL_TABLET | Freq: Four times a day (QID) | ORAL | Status: DC | PRN

## 2015-08-05 NOTE — Telephone Encounter (Signed)
Refills sent to pharmacy. 

## 2015-08-05 NOTE — Telephone Encounter (Signed)
Called patient to inform her that prescriptions for Zofran and Xanax have been escribed per her request.

## 2015-08-05 NOTE — Telephone Encounter (Signed)
Patient requesting refills for Xanax and Zofran.

## 2015-08-12 ENCOUNTER — Inpatient Hospital Stay: Payer: Medicare Other | Attending: Oncology | Admitting: Oncology

## 2015-08-12 ENCOUNTER — Inpatient Hospital Stay: Payer: Medicare Other

## 2015-08-12 ENCOUNTER — Encounter: Payer: Self-pay | Admitting: Oncology

## 2015-08-12 VITALS — BP 122/78 | HR 78 | Temp 97.1°F | Resp 20

## 2015-08-12 VITALS — BP 119/77 | HR 76 | Temp 96.7°F | Wt 129.4 lb

## 2015-08-12 DIAGNOSIS — Z79899 Other long term (current) drug therapy: Secondary | ICD-10-CM

## 2015-08-12 DIAGNOSIS — C55 Malignant neoplasm of uterus, part unspecified: Secondary | ICD-10-CM

## 2015-08-12 DIAGNOSIS — C541 Malignant neoplasm of endometrium: Secondary | ICD-10-CM | POA: Insufficient documentation

## 2015-08-12 DIAGNOSIS — Z8542 Personal history of malignant neoplasm of other parts of uterus: Secondary | ICD-10-CM

## 2015-08-12 LAB — CBC WITH DIFFERENTIAL/PLATELET
Basophils Absolute: 0 10*3/uL (ref 0–0.1)
Basophils Relative: 0 %
Eosinophils Absolute: 0.1 10*3/uL (ref 0–0.7)
Eosinophils Relative: 2 %
HEMATOCRIT: 28.1 % — AB (ref 35.0–47.0)
HEMOGLOBIN: 9.4 g/dL — AB (ref 12.0–16.0)
LYMPHS ABS: 0.5 10*3/uL — AB (ref 1.0–3.6)
Lymphocytes Relative: 9 %
MCH: 27.7 pg (ref 26.0–34.0)
MCHC: 33.5 g/dL (ref 32.0–36.0)
MCV: 82.9 fL (ref 80.0–100.0)
MONOS PCT: 9 %
Monocytes Absolute: 0.4 10*3/uL (ref 0.2–0.9)
NEUTROS ABS: 4 10*3/uL (ref 1.4–6.5)
NEUTROS PCT: 80 %
Platelets: 226 10*3/uL (ref 150–440)
RBC: 3.39 MIL/uL — ABNORMAL LOW (ref 3.80–5.20)
RDW: 17 % — ABNORMAL HIGH (ref 11.5–14.5)
WBC: 5 10*3/uL (ref 3.6–11.0)

## 2015-08-12 LAB — COMPREHENSIVE METABOLIC PANEL
ALK PHOS: 69 U/L (ref 38–126)
ALT: 14 U/L (ref 14–54)
ANION GAP: 9 (ref 5–15)
AST: 25 U/L (ref 15–41)
Albumin: 3.5 g/dL (ref 3.5–5.0)
BILIRUBIN TOTAL: 0.5 mg/dL (ref 0.3–1.2)
BUN: 31 mg/dL — ABNORMAL HIGH (ref 6–20)
CALCIUM: 8.5 mg/dL — AB (ref 8.9–10.3)
CO2: 22 mmol/L (ref 22–32)
Chloride: 99 mmol/L — ABNORMAL LOW (ref 101–111)
Creatinine, Ser: 1.41 mg/dL — ABNORMAL HIGH (ref 0.44–1.00)
GFR calc non Af Amer: 38 mL/min — ABNORMAL LOW (ref >60)
GFR, EST AFRICAN AMERICAN: 44 mL/min — AB (ref >60)
GLUCOSE: 103 mg/dL — AB (ref 65–99)
Potassium: 3.8 mmol/L (ref 3.5–5.1)
Sodium: 130 mmol/L — ABNORMAL LOW (ref 135–145)
TOTAL PROTEIN: 7 g/dL (ref 6.5–8.1)

## 2015-08-12 LAB — MAGNESIUM: Magnesium: 1.9 mg/dL (ref 1.7–2.4)

## 2015-08-12 MED ORDER — SODIUM CHLORIDE 0.9 % IV SOLN
Freq: Once | INTRAVENOUS | Status: AC
  Administered 2015-08-12: 10:00:00 via INTRAVENOUS
  Filled 2015-08-12: qty 1000

## 2015-08-12 MED ORDER — SODIUM CHLORIDE 0.9 % IJ SOLN
10.0000 mL | INTRAMUSCULAR | Status: DC | PRN
  Filled 2015-08-12: qty 10

## 2015-08-12 MED ORDER — NIVOLUMAB CHEMO INJECTION 100 MG/10ML
180.0000 mg | Freq: Once | INTRAVENOUS | Status: AC
  Administered 2015-08-12: 180 mg via INTRAVENOUS
  Filled 2015-08-12: qty 18

## 2015-08-12 MED ORDER — SODIUM CHLORIDE 0.9 % IV SOLN
Freq: Once | INTRAVENOUS | Status: AC
Start: 2015-08-12 — End: 2015-08-12
  Administered 2015-08-12: 10:00:00 via INTRAVENOUS
  Filled 2015-08-12: qty 1000

## 2015-08-12 MED ORDER — FENTANYL 25 MCG/HR TD PT72: 25.0000 ug | MEDICATED_PATCH | TRANSDERMAL | Status: DC

## 2015-08-12 MED ORDER — SODIUM CHLORIDE 0.9 % IV SOLN
Freq: Once | INTRAVENOUS | Status: DC
  Filled 2015-08-12: qty 1000

## 2015-08-12 MED ORDER — PROMETHAZINE HCL 25 MG/ML IJ SOLN
25.0000 mg | Freq: Once | INTRAMUSCULAR | Status: AC
  Administered 2015-08-12: 25 mg via INTRAVENOUS
  Filled 2015-08-12: qty 1

## 2015-08-12 MED ORDER — SODIUM CHLORIDE 0.45 % IV SOLN
Freq: Once | INTRAVENOUS | Status: DC
  Filled 2015-08-12: qty 1000

## 2015-08-12 MED ORDER — SODIUM CHLORIDE 0.9 % IV SOLN
Freq: Once | INTRAVENOUS | Status: AC
  Administered 2015-08-12: 12:00:00 via INTRAVENOUS
  Filled 2015-08-12: qty 4

## 2015-08-12 MED ORDER — HEPARIN SOD (PORK) LOCK FLUSH 100 UNIT/ML IV SOLN: 500.0000 [IU] | Freq: Once | INTRAVENOUS | Status: DC

## 2015-08-12 MED ORDER — HEPARIN SOD (PORK) LOCK FLUSH 100 UNIT/ML IV SOLN
INTRAVENOUS | Status: AC
  Filled 2015-08-12: qty 5

## 2015-08-12 NOTE — Progress Notes (Signed)
Patient does have living will. Never smoked. 

## 2015-08-12 NOTE — Progress Notes (Signed)
[place] @ [facility] Telephone:([phone]  Fax:([phone]     Sharnise Blough OB: 1950-12-30  MR#: [id number]  QJJ#:[id number]  [name], MD as PCP - General (Family Medicine) [name], MD (General Surgery)  CHIEF COMPLAINT:  No chief complaint on file.   Chief Complaint/Diagnosis:   The patient is a 64 year old with metastatic endometrial cancer who is seen for assessment during ongoing palliative radiation therapy.  04/2011   papillary serous adenocarcinoma of the endometrium, stage IIIb (nodal metastasis)              TAHBSO and full staging,               GOG protocl using XRT and chemotherapy, significant XRT side-effects,               continued on chemotherapy off protocol, 04/2013   recurrence in the nodal areas,              DDP and docetaxel x 6 completed in December,   started on chemotherapy with Adriamycin(October, 2015) poor tolerance to letrozole  and affinator Estrogen and progesterone receptor negative tumor         hER-2/neu recetor negative. November 07, 2014 progressive disease by  tumor marker criteria and clinical examination November 12, 2014 After 6 cycles of chemotherapy because of increasing side effect chemotherapy was discontinued.  (March, 2016) Patient was started on cis-platinum and topotecan. May, 2016 Cis-platinum into Portec and was discontinued because of significant side effect and possibility of progressive disease.  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  Patient is  here for ongoing evaluation and treatment consideration No chills.  No fever.  Feeling extremely weak and tired.  Patient had constipation followed by black stool.  No epigastric discomfort.  Poor appetite. July 15, 2015 Patient's appetite is improved on Megace. Feeling somewhat better as gained 3 pounds of weight.  Left supraclavicular lymph nodes and decrease in size.  Patient had a PET scan done which revealed significant decrease in the size of the lymph node.  Here for further follow-up and treatment consideration September 6,2016  Patient is here for ongoing evauation and treatmnt consideration.appetite is improved. Left neck masses decrease in size.  Abdominal pain has mproved.  Patient has gained weight  here for further evaluation and contnuation  of treatmnt  no tingling..  No numbness  REVIEW OF SYSTEMS:    general status: Patient is feeling weak and tired.  No change in a performance status.  No chills.  No fever.  Feeling extremely weak and tired HEENT: Alopecia.  No evidence of stomatitis increasing discomfort on the left side of the neck Lungs: No cough or shortness of breath Cardiac: No chest pain or paroxysmal nocturnal dyspnea GI: No nausea no vomiting no diarrhea no abdominal pain On stay patient followed by black stool. Skin: No rash Lower extremity no swelling Neurological system: No tingling.  No numbness.  No  other focal signs Musculoskeletal system no bony pains poor appetite has lost approximately 5 pounds in last month Appetite is improving.  Gained 3 pounds of weight.  PAST MEDICAL HISTORY: Past Medical History  Diagnosis Date  . Cancer 2012  . GI problem 2013  . Cancer of uterus 04/18/2015    PAST SURGICAL HISTORY: Past Surgical History  Procedure Laterality Date  . Abdominal hysterectomy      FAMILY HISTORY No significant family history.      ADVANCED DIRECTIVES: Patient does have advance directive   HEALTH MAINTENANCE: Social History    Substance Use Topics  . Smoking status: Never Smoker   . Smokeless tobacco: Never Used  . Alcohol Use: Yes      Allergies  Allergen Reactions  . Penicillins Rash    Current Outpatient Prescriptions  Medication Sig Dispense Refill  . ALPRAZolam (XANAX) 0.25 MG tablet Take 1 tablet (0.25 mg total) by mouth 2 (two) times daily as needed for anxiety. 30 tablet 3  . bismuth subsalicylate (PEPTO BISMOL) 262 MG chewable tablet Chew 524 mg by mouth as needed for indigestion.     . cholestyramine (QUESTRAN) 4 G packet Take 1 packet by mouth 3 (three) times daily as needed.     . citalopram (CELEXA) 40 MG tablet Take 1 tablet (40 mg total) by mouth daily. 30 tablet 3  . fentaNYL (DURAGESIC - DOSED MCG/HR) 50 MCG/HR Place 1 patch (50 mcg total) onto the skin every 3 (three) days. 10 patch 0  . fexofenadine (ALLEGRA) 180 MG tablet Take 180 mg by mouth as needed.     . fluticasone (FLONASE) 50 MCG/ACT nasal spray Place 1-2 sprays into the nose daily as needed.    Marland Kitchen HYDROcodone-acetaminophen (NORCO/VICODIN) 5-325 MG per tablet Take 1 tablet by mouth every 6 (six) hours as needed for moderate pain. 60 tablet 0  . lidocaine-prilocaine (EMLA) cream Apply to affected area once 30 g 3  . megestrol (MEGACE) 400 MG/10ML suspension Take 10 mLs (400 mg total) by mouth daily. 300 mL 6  . metoCLOPramide (REGLAN) 10 MG tablet Take 10 mg by mouth 4 (four) times daily.    Marland Kitchen omeprazole (PRILOSEC) 20 MG capsule     . ondansetron (ZOFRAN) 4 MG tablet Take 1 tablet (4 mg total) by mouth every 6 (six) hours as needed. 60 tablet 3  . polyethylene glycol powder (GLYCOLAX/MIRALAX) powder Take by mouth.    . potassium chloride (K-DUR) 10 MEQ tablet Take 1 tablet (10 mEq total) by mouth daily. 30 tablet 3  . predniSONE (DELTASONE) 10 MG tablet Take 1 tab po x 14 days, then take 0.5 tab every other day for 2 weeks then stop 18 tablet 0  . ranitidine (ZANTAC) 150 MG tablet Take 1 tablet by mouth 2 (two) times daily.    .  sertraline (ZOLOFT) 25 MG tablet Take 25 mg by mouth daily.     No current facility-administered medications for this visit.   Facility-Administered Medications Ordered in Other Visits  Medication Dose Route Frequency Provider Last Rate Last Dose  . sodium chloride 0.9 % injection 10 mL  10 mL Intracatheter PRN Forest Gleason, MD   10 mL at 05/06/15 1028  . sodium chloride 0.9 % injection 10 mL  10 mL Intravenous PRN Forest Gleason, MD        OBJECTIVE:  There were no vitals filed for this visit.   There is no weight on file to calculate BMI.    ECOG FS:1 -  Symptomatic but completely ambulatory  PHYSICAL EXAM: Gen. status: Alert and oriented lady not any acute distress Patient is very emotional with frequent crying spells Eyes: Pale-looking sclera and conjunctiva HEENT: Left supraclavicular  Mass .  Mass appears to decrease in size and less tender.  Cardiac exam revealed the PMI to be normally situated and sized. The rhythm was regular and no extrasystoles were noted during several minutes of auscultation. The first and second heart sounds were normal and physiologic splitting of the second heart sound was noted. There were no murmurs, rubs, clicks, or gallops.. Examination of the chest was unremarkable. There were no bony deformities, no asymmetry, and no other abnormalities.. Neurologically, the patient was awake, alert, and oriented to person, place and time. There were no obvious focal neurologic abnormalities. Examination of the skin revealed no evidence of significant rashes, suspicious appearing nevi or other concerning lesions.  Lower extremityswelling. Psychiatric system: Patient is   depressed LAB RESULTS:  No visits with results within 3 Day(s) from this visit. Latest known visit with results is:  Infusion on 07/29/2015  Component Date Value Ref Range Status  . WBC 07/29/2015 5.5  3.6 - 11.0 K/uL Final   A-LINE DRAW  . RBC 07/29/2015 3.64* 3.80 - 5.20 MIL/uL Final  .  Hemoglobin 07/29/2015 9.9* 12.0 - 16.0 g/dL Final  . HCT 07/29/2015 29.9* 35.0 - 47.0 % Final  . MCV 07/29/2015 82.2  80.0 - 100.0 fL Final  . MCH 07/29/2015 27.1  26.0 - 34.0 pg Final  . MCHC 07/29/2015 33.0  32.0 - 36.0 g/dL Final  . RDW 07/29/2015 17.9* 11.5 - 14.5 % Final  . Platelets 07/29/2015 275  150 - 440 K/uL Final  . Neutrophils Relative % 07/29/2015 74   Final  . Neutro Abs 07/29/2015 4.1  1.4 - 6.5 K/uL Final  . Lymphocytes Relative 07/29/2015 13   Final  . Lymphs Abs 07/29/2015 0.7* 1.0 - 3.6 K/uL Final  . Monocytes Relative 07/29/2015 9   Final  . Monocytes Absolute 07/29/2015 0.5  0.2 - 0.9 K/uL Final  . Eosinophils Relative 07/29/2015 3   Final  . Eosinophils Absolute 07/29/2015 0.2  0 - 0.7 K/uL Final  . Basophils Relative 07/29/2015 1   Final  . Basophils Absolute 07/29/2015 0.0  0 - 0.1 K/uL Final    Lab Results  Component Value Date   CA125 18.8 07/23/2014    ASSESSMENT: Carcinoma of endometrium  Stage IV disease.  Patient has failed  multiple treatment Significant response to  nivolulamab .  hold Megace  Reduce fentanyl patch to 25 g Continue normal saline thousand cc t the time of each treatment  Reevaluate patient in 4 weeks Consider PET scan  By and of October All lab data has been reviewed MEDICAL DECISION MAKING:   All lab data has been reviewed Continue Megace. Continue new or Leyla mapped in view of PET scan showing creases in the size of lymphadenopathy and symptomatically patient is up improved.  Patient reporting black stool may be due to Pepto-Bismol small as hemoglobin is stable Discuss all these findings and reviewed PET scan with the patient. Cancer of uterus   Staging form: Corpus Uteri - Carcinoma, AJCC 7th Edition     Clinical: Stage IVB (T3b, N2, M1) - Signed by Forest Gleason, MD on 04/18/2015   Forest Gleason, MD   08/12/2015 9:15 AM

## 2015-08-12 NOTE — Progress Notes (Signed)
Called Angie Fava, RN, regarding patients fluid orders - Dr. Oliva Bustard wants patient to get the 0.9% sodium chloride 1000 mL bag at rate of 500 per hour over two hours.  LJ

## 2015-08-26 ENCOUNTER — Inpatient Hospital Stay: Payer: Medicare Other

## 2015-08-26 VITALS — BP 124/76 | HR 76 | Temp 97.0°F | Resp 20

## 2015-08-26 DIAGNOSIS — C541 Malignant neoplasm of endometrium: Secondary | ICD-10-CM | POA: Diagnosis not present

## 2015-08-26 DIAGNOSIS — C55 Malignant neoplasm of uterus, part unspecified: Secondary | ICD-10-CM

## 2015-08-26 LAB — CBC WITH DIFFERENTIAL/PLATELET
Basophils Absolute: 0 10*3/uL (ref 0–0.1)
Basophils Relative: 1 %
EOS PCT: 2 %
Eosinophils Absolute: 0.1 10*3/uL (ref 0–0.7)
HCT: 28.2 % — ABNORMAL LOW (ref 35.0–47.0)
Hemoglobin: 9.5 g/dL — ABNORMAL LOW (ref 12.0–16.0)
LYMPHS PCT: 10 %
Lymphs Abs: 0.6 10*3/uL — ABNORMAL LOW (ref 1.0–3.6)
MCH: 28.3 pg (ref 26.0–34.0)
MCHC: 33.5 g/dL (ref 32.0–36.0)
MCV: 84.4 fL (ref 80.0–100.0)
MONO ABS: 0.5 10*3/uL (ref 0.2–0.9)
Monocytes Relative: 8 %
Neutro Abs: 4.4 10*3/uL (ref 1.4–6.5)
Neutrophils Relative %: 79 %
Platelets: 265 10*3/uL (ref 150–440)
RBC: 3.35 MIL/uL — ABNORMAL LOW (ref 3.80–5.20)
RDW: 15.6 % — AB (ref 11.5–14.5)
WBC: 5.6 10*3/uL (ref 3.6–11.0)

## 2015-08-26 MED ORDER — SODIUM CHLORIDE 0.9 % IJ SOLN
10.0000 mL | INTRAMUSCULAR | Status: DC | PRN
  Administered 2015-08-26: 10 mL via INTRAVENOUS
  Filled 2015-08-26: qty 10

## 2015-08-26 MED ORDER — SODIUM CHLORIDE 0.9 % IV SOLN
180.0000 mg | Freq: Once | INTRAVENOUS | Status: AC
  Administered 2015-08-26: 180 mg via INTRAVENOUS
  Filled 2015-08-26: qty 18

## 2015-08-26 MED ORDER — SODIUM CHLORIDE 0.9 % IV SOLN
Freq: Once | INTRAVENOUS | Status: AC
  Administered 2015-08-26: 11:00:00 via INTRAVENOUS
  Filled 2015-08-26: qty 4

## 2015-08-26 MED ORDER — SODIUM CHLORIDE 0.9 % IV SOLN
Freq: Once | INTRAVENOUS | Status: AC
  Administered 2015-08-26: 11:00:00 via INTRAVENOUS
  Filled 2015-08-26: qty 1000

## 2015-08-26 MED ORDER — SODIUM CHLORIDE 0.9 % IV SOLN
Freq: Once | INTRAVENOUS | Status: AC
  Administered 2015-08-26: 09:00:00 via INTRAVENOUS
  Filled 2015-08-26: qty 1000

## 2015-08-26 MED ORDER — HEPARIN SOD (PORK) LOCK FLUSH 100 UNIT/ML IV SOLN
500.0000 [IU] | Freq: Once | INTRAVENOUS | Status: AC
  Administered 2015-08-26: 500 [IU] via INTRAVENOUS
  Filled 2015-08-26: qty 5

## 2015-08-28 ENCOUNTER — Telehealth: Payer: Self-pay | Admitting: *Deleted

## 2015-08-28 DIAGNOSIS — C55 Malignant neoplasm of uterus, part unspecified: Secondary | ICD-10-CM

## 2015-08-28 MED ORDER — FENTANYL 25 MCG/HR TD PT72: 25.0000 ug | MEDICATED_PATCH | TRANSDERMAL | Status: DC

## 2015-08-28 NOTE — Telephone Encounter (Signed)
Patient requesting refill for Fentanyl patches.  Also wants to know if she can have fluids scheduled for her week off from chemo.  States she feels better when she gets fluids.

## 2015-08-28 NOTE — Telephone Encounter (Signed)
Okay to refill fentanyl patches. Rx ready for pick up at registration desk. Pt will be scheduled for IVF on week off from treatment. Schedulers will call pt with appt details.

## 2015-09-02 ENCOUNTER — Other Ambulatory Visit: Payer: Self-pay | Admitting: Oncology

## 2015-09-02 ENCOUNTER — Inpatient Hospital Stay: Payer: Medicare Other

## 2015-09-02 VITALS — BP 128/75 | HR 76 | Temp 97.5°F | Resp 20

## 2015-09-02 DIAGNOSIS — C541 Malignant neoplasm of endometrium: Secondary | ICD-10-CM | POA: Diagnosis not present

## 2015-09-02 DIAGNOSIS — C55 Malignant neoplasm of uterus, part unspecified: Secondary | ICD-10-CM

## 2015-09-02 MED ORDER — SODIUM CHLORIDE 0.9 % IV SOLN
Freq: Once | INTRAVENOUS | Status: AC
  Administered 2015-09-02: 15:00:00 via INTRAVENOUS
  Filled 2015-09-02: qty 4

## 2015-09-02 MED ORDER — HEPARIN SOD (PORK) LOCK FLUSH 100 UNIT/ML IV SOLN
500.0000 [IU] | Freq: Once | INTRAVENOUS | Status: AC | PRN
  Administered 2015-09-02: 500 [IU]

## 2015-09-02 MED ORDER — SODIUM CHLORIDE 0.9 % IV SOLN
Freq: Once | INTRAVENOUS | Status: AC
  Administered 2015-09-02: 15:00:00 via INTRAVENOUS
  Filled 2015-09-02: qty 1000

## 2015-09-02 MED ORDER — SODIUM CHLORIDE 0.9 % IJ SOLN
10.0000 mL | INTRAMUSCULAR | Status: DC | PRN
  Administered 2015-09-02: 10 mL
  Filled 2015-09-02: qty 10

## 2015-09-09 ENCOUNTER — Inpatient Hospital Stay (HOSPITAL_BASED_OUTPATIENT_CLINIC_OR_DEPARTMENT_OTHER): Payer: Medicare Other | Admitting: Oncology

## 2015-09-09 ENCOUNTER — Encounter: Payer: Self-pay | Admitting: Oncology

## 2015-09-09 ENCOUNTER — Inpatient Hospital Stay: Payer: Medicare Other

## 2015-09-09 ENCOUNTER — Inpatient Hospital Stay: Payer: Medicare Other | Attending: Oncology

## 2015-09-09 VITALS — BP 130/72 | HR 78 | Temp 97.6°F | Wt 124.8 lb

## 2015-09-09 DIAGNOSIS — C541 Malignant neoplasm of endometrium: Secondary | ICD-10-CM

## 2015-09-09 DIAGNOSIS — R21 Rash and other nonspecific skin eruption: Secondary | ICD-10-CM | POA: Insufficient documentation

## 2015-09-09 DIAGNOSIS — R197 Diarrhea, unspecified: Secondary | ICD-10-CM | POA: Insufficient documentation

## 2015-09-09 DIAGNOSIS — R109 Unspecified abdominal pain: Secondary | ICD-10-CM | POA: Insufficient documentation

## 2015-09-09 DIAGNOSIS — Z79899 Other long term (current) drug therapy: Secondary | ICD-10-CM

## 2015-09-09 DIAGNOSIS — C55 Malignant neoplasm of uterus, part unspecified: Secondary | ICD-10-CM

## 2015-09-09 DIAGNOSIS — Z8542 Personal history of malignant neoplasm of other parts of uterus: Secondary | ICD-10-CM

## 2015-09-09 LAB — CBC WITH DIFFERENTIAL/PLATELET
BASOS ABS: 0 10*3/uL (ref 0–0.1)
Basophils Relative: 1 %
Eosinophils Absolute: 0.1 10*3/uL (ref 0–0.7)
Eosinophils Relative: 3 %
HEMATOCRIT: 26.3 % — AB (ref 35.0–47.0)
Hemoglobin: 9.1 g/dL — ABNORMAL LOW (ref 12.0–16.0)
LYMPHS ABS: 0.4 10*3/uL — AB (ref 1.0–3.6)
LYMPHS PCT: 8 %
MCH: 29.6 pg (ref 26.0–34.0)
MCHC: 34.7 g/dL (ref 32.0–36.0)
MCV: 85.3 fL (ref 80.0–100.0)
MONO ABS: 0.4 10*3/uL (ref 0.2–0.9)
Monocytes Relative: 9 %
NEUTROS ABS: 3.6 10*3/uL (ref 1.4–6.5)
Neutrophils Relative %: 79 %
Platelets: 257 10*3/uL (ref 150–440)
RBC: 3.08 MIL/uL — AB (ref 3.80–5.20)
RDW: 14.4 % (ref 11.5–14.5)
WBC: 4.5 10*3/uL (ref 3.6–11.0)

## 2015-09-09 LAB — COMPREHENSIVE METABOLIC PANEL
ALT: 16 U/L (ref 14–54)
AST: 21 U/L (ref 15–41)
Albumin: 3.5 g/dL (ref 3.5–5.0)
Alkaline Phosphatase: 107 U/L (ref 38–126)
Anion gap: 7 (ref 5–15)
BILIRUBIN TOTAL: 0.4 mg/dL (ref 0.3–1.2)
BUN: 24 mg/dL — AB (ref 6–20)
CO2: 26 mmol/L (ref 22–32)
CREATININE: 1.74 mg/dL — AB (ref 0.44–1.00)
Calcium: 8.4 mg/dL — ABNORMAL LOW (ref 8.9–10.3)
Chloride: 99 mmol/L — ABNORMAL LOW (ref 101–111)
GFR calc Af Amer: 34 mL/min — ABNORMAL LOW (ref >60)
GFR, EST NON AFRICAN AMERICAN: 29 mL/min — AB (ref >60)
Glucose, Bld: 93 mg/dL (ref 65–99)
Potassium: 4 mmol/L (ref 3.5–5.1)
Sodium: 132 mmol/L — ABNORMAL LOW (ref 135–145)
TOTAL PROTEIN: 6.9 g/dL (ref 6.5–8.1)

## 2015-09-09 LAB — MAGNESIUM: MAGNESIUM: 2 mg/dL (ref 1.7–2.4)

## 2015-09-09 MED ORDER — SODIUM CHLORIDE 0.9 % IV SOLN
Freq: Once | INTRAVENOUS | Status: DC
  Filled 2015-09-09: qty 1000

## 2015-09-09 MED ORDER — SODIUM CHLORIDE 0.9 % IJ SOLN
10.0000 mL | INTRAMUSCULAR | Status: DC | PRN
  Administered 2015-09-09: 10 mL via INTRAVENOUS
  Filled 2015-09-09: qty 10

## 2015-09-09 MED ORDER — HEPARIN SOD (PORK) LOCK FLUSH 100 UNIT/ML IV SOLN: 500.0000 [IU] | Freq: Once | INTRAVENOUS | Status: DC | PRN

## 2015-09-09 MED ORDER — SODIUM CHLORIDE 0.9 % IJ SOLN
10.0000 mL | INTRAMUSCULAR | Status: DC | PRN
  Filled 2015-09-09: qty 10

## 2015-09-09 MED ORDER — SODIUM CHLORIDE 0.9 % IV SOLN
240.0000 mg | Freq: Once | INTRAVENOUS | Status: AC
  Administered 2015-09-09: 240 mg via INTRAVENOUS
  Filled 2015-09-09: qty 8

## 2015-09-09 MED ORDER — SODIUM CHLORIDE 0.9 % IV SOLN
Freq: Once | INTRAVENOUS | Status: AC
  Administered 2015-09-09: 11:00:00 via INTRAVENOUS
  Filled 2015-09-09: qty 1000

## 2015-09-09 MED ORDER — SODIUM CHLORIDE 0.9 % IV SOLN
Freq: Once | INTRAVENOUS | Status: AC
  Administered 2015-09-09: 11:00:00 via INTRAVENOUS
  Filled 2015-09-09: qty 4

## 2015-09-09 MED ORDER — HEPARIN SOD (PORK) LOCK FLUSH 100 UNIT/ML IV SOLN
500.0000 [IU] | Freq: Once | INTRAVENOUS | Status: AC
  Administered 2015-09-09: 500 [IU] via INTRAVENOUS
  Filled 2015-09-09: qty 5

## 2015-09-09 MED ORDER — HYDROCODONE-ACETAMINOPHEN 5-325 MG PO TABS: 1.0000 | ORAL_TABLET | Freq: Four times a day (QID) | ORAL | Status: DC | PRN

## 2015-09-09 NOTE — Progress Notes (Signed)
Patient does have living will.  Former smoker.  Patient complains of sharp intermittent abdominal pain.  Also has a rash on her abdomen and around her hips. It is not painful, does not look like blisters, but does itch.

## 2015-09-13 ENCOUNTER — Encounter: Payer: Self-pay | Admitting: Oncology

## 2015-09-13 NOTE — Progress Notes (Signed)
[place] @ [facility] Telephone:(336) [phone]  [phone]     [name] Graham OB: Feb 26, 1951  MR#: [id number]  KCM#:[id number]  Patient Care Team: [name] Body, MD as PCP - General (Family Medicine) [name] Robinette Haines, MD (General Surgery)  CHIEF COMPLAINT:  Chief Complaint  Patient presents with  . OTHER    Chief Complaint/Diagnosis:   The patient is a 64 year old with metastatic endometrial cancer who is seen for assessment during ongoing palliative radiation therapy.  04/2011   papillary serous adenocarcinoma of the endometrium, stage IIIb (nodal metastasis)              TAHBSO and full staging,               GOG protocl using XRT and chemotherapy, significant XRT side-effects,               continued on chemotherapy off protocol, 04/2013   recurrence in the nodal areas,              DDP and docetaxel x 6 completed in December,   started on chemotherapy with Adriamycin(October, 2015) poor tolerance to letrozole  and affinator Estrogen and progesterone receptor negative tumor         hER-2/neu recetor negative. November 07, 2014 progressive disease by  tumor marker criteria and clinical examination November 12, 2014 After 6 cycles of chemotherapy because of increasing side effect chemotherapy was discontinued.  (March, 2016) Patient was started on cis-platinum and topotecan. May, 2016 Cis-platinum into Portec and was discontinued because of significant side effect and possibility of progressive disease.  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  Patient is here for ongoing evaluation and treatment consideration No chills.  No fever.  Feeling extremely weak and tired.  Patient had constipation followed by black stool.  No epigastric discomfort.  Poor appetite. July 15, 2015 Patient's appetite is improved on Megace. Feeling somewhat better as gained 3 pounds of weight.  Left supraclavicular lymph nodes and decrease in size.  Patient had a PET scan done which revealed significant decrease in the size of the lymph node.  Here for further follow-up and treatment consideration October, 2016 Patient is here for ongoing evaluation and treatment consideration. For last few days patient started having some abdominal discomfort and diarrhea.  Appetite is somewhat decreased. Pain is in the lower abdominal area constant Diarrhea has resolved. Crampy pain  REVIEW OF SYSTEMS:    general status: Patient is feeling weak and tired.  No change in a performance status.  No chills.  No fever.  Feeling extremely weak and tired HEENT: Alopecia.  No evidence of stomatitis increasing discomfort on the left side of the neck Lungs: No cough or shortness of breath Cardiac: No chest pain or paroxysmal nocturnal dyspnea GI: No nausea no vomiting no diarrhea no abdominal pain  On stay patient followed by black stool. Skin: No rash Lower extremity no swelling Neurological system: No tingling.  No numbness.  No other focal signs Musculoskeletal system no bony pains poor appetite has lost approximately 5 pounds in last month Appetite is improving.  Gained 3 pounds of weight.  PAST MEDICAL HISTORY: Past Medical History  Diagnosis Date  . Cancer ([place]) 2012  . GI problem 2013  . Cancer of uterus ([place]) 04/18/2015    PAST SURGICAL HISTORY: Past Surgical History  Procedure Laterality Date  . Abdominal hysterectomy      FAMILY HISTORY No significant family history.       ADVANCED DIRECTIVES: Patient does have advance directive   HEALTH MAINTENANCE: Social History  Substance Use Topics  . Smoking status: Never Smoker   . Smokeless tobacco: Never Used  . Alcohol Use: Yes      Allergies  Allergen Reactions  . Penicillins Rash    Current Outpatient Prescriptions  Medication Sig Dispense Refill  . ALPRAZolam (XANAX) 0.25 MG tablet Take 1 tablet (0.25 mg total) by mouth 2 (two) times daily as needed for anxiety. 30 tablet 3  . bismuth subsalicylate (PEPTO BISMOL) 262 MG chewable tablet Chew 524 mg by mouth as needed for indigestion.     . cholestyramine (QUESTRAN) 4 G packet Take 1 packet by mouth 3 (three) times daily as needed.     . citalopram (CELEXA) 40 MG tablet Take 1 tablet (40 mg total) by mouth daily. 30 tablet 3  . fentaNYL (DURAGESIC - DOSED MCG/HR) 25 MCG/HR patch Place 1 patch (25 mcg total) onto the skin every 3 (three) days. 10 patch 0  . fexofenadine (ALLEGRA) 180 MG tablet Take 180 mg by mouth as needed.     . fluticasone (FLONASE) 50 MCG/ACT nasal spray Place 1-2 sprays into the nose daily as needed.    Marland Kitchen HYDROcodone-acetaminophen (NORCO/VICODIN) 5-325 MG tablet Take 1 tablet by mouth every 6 (six) hours as needed for moderate pain. 60 tablet 0  . lidocaine-prilocaine (EMLA) cream Apply to affected area once 30 g 3  . megestrol (MEGACE) 400 MG/10ML suspension Take 10 mLs (400 mg total) by mouth daily. 300 mL 6  . metoCLOPramide (REGLAN) 10 MG tablet Take 10 mg by mouth 4 (four) times daily.    Marland Kitchen omeprazole (PRILOSEC) 20 MG capsule     . omeprazole (PRILOSEC) 20 MG capsule TAKE 1 CAPSULE BY MOUTH TWICE DAILY BEFORE MEALS 60 capsule 0  . ondansetron (ZOFRAN) 4 MG tablet Take 1 tablet (4 mg total) by mouth every 6 (six) hours as needed. 60 tablet 3  . polyethylene glycol powder (GLYCOLAX/MIRALAX) powder Take by mouth.    . potassium chloride (K-DUR) 10 MEQ tablet Take 1 tablet (10 mEq total) by mouth daily. 30 tablet 3  .  ranitidine (ZANTAC) 150 MG tablet Take 1 tablet by mouth 2 (two) times daily.    . sertraline (ZOLOFT) 25 MG tablet Take 25 mg by mouth daily.     No current facility-administered medications for this visit.   Facility-Administered Medications Ordered in Other Visits  Medication Dose Route Frequency Provider Last Rate Last Dose  . sodium chloride 0.9 % injection 10 mL  10 mL Intracatheter PRN Forest Gleason, MD   10 mL at 05/06/15 1028  . sodium chloride 0.9 % injection 10 mL  10 mL Intravenous PRN Forest Gleason, MD        OBJECTIVE:  Filed Vitals:   09/09/15 1002  BP: 130/72  Pulse: 78  Temp: 97.6 F (36.4 C)     Graham mass index is 22.11 kg/(m^2).    ECOG FS:1 - Symptomatic but completely ambulatory  PHYSICAL EXAM: Gen. status: Alert and oriented lady not any acute distress Patient is very emotional with frequent crying spells Eyes: Pale-looking sclera and conjunctiva HEENT: Left supraclavicular  Mass is not palpable at present time Cardiac exam revealed the PMI to be normally situated and sized. The rhythm was regular and no extrasystoles were noted during several minutes of auscultation. The first and second heart sounds were normal and physiologic splitting of the second heart sound was noted. There were no murmurs, rubs, clicks, or gallops.. Examination of the chest was unremarkable. There were no bony deformities, no asymmetry, and no other abnormalities.. Neurologically, the patient was awake, alert, and oriented to person, place and time. There were no obvious focal neurologic abnormalities. Examination of the skin revealed no evidence of significant rashes, suspicious appearing nevi or other concerning lesions. Abdomen: Slight tenderness no palpable masses bowel sounds are present Lower extremityswelling. Psychiatric system: Patient is   depressed LAB RESULTS:  Infusion on 09/09/2015  Component Date Value Ref Range Status  . WBC 09/09/2015 4.5  3.6 - 11.0 K/uL Final  . RBC  09/09/2015 3.08* 3.80 - 5.20 MIL/uL Final  . Hemoglobin 09/09/2015 9.1* 12.0 - 16.0 g/dL Final  . HCT 09/09/2015 26.3* 35.0 - 47.0 % Final  . MCV 09/09/2015 85.3  80.0 - 100.0 fL Final  . MCH 09/09/2015 29.6  26.0 - 34.0 pg Final  . MCHC 09/09/2015 34.7  32.0 - 36.0 g/dL Final  . RDW 09/09/2015 14.4  11.5 - 14.5 % Final  . Platelets 09/09/2015 257  150 - 440 K/uL Final  . Neutrophils Relative % 09/09/2015 79   Final  . Neutro Abs 09/09/2015 3.6  1.4 - 6.5 K/uL Final  . Lymphocytes Relative 09/09/2015 8   Final  . Lymphs Abs 09/09/2015 0.4* 1.0 - 3.6 K/uL Final  . Monocytes Relative 09/09/2015 9   Final  . Monocytes Absolute 09/09/2015 0.4  0.2 - 0.9 K/uL Final  . Eosinophils Relative 09/09/2015 3   Final  . Eosinophils Absolute 09/09/2015 0.1  0 - 0.7 K/uL Final  . Basophils Relative 09/09/2015 1   Final  . Basophils Absolute 09/09/2015 0.0  0 - 0.1 K/uL Final  . Sodium 09/09/2015 132* 135 - 145 mmol/L Final  . Potassium 09/09/2015 4.0  3.5 - 5.1 mmol/L Final  . Chloride 09/09/2015 99* 101 - 111 mmol/L Final  . CO2 09/09/2015 26  22 - 32 mmol/L Final  . Glucose, Bld 09/09/2015 93  65 - 99 mg/dL Final  . BUN 09/09/2015 24* 6 - 20 mg/dL Final  . Creatinine, Ser 09/09/2015 1.74* 0.44 - 1.00 mg/dL Final  . Calcium 09/09/2015 8.4* 8.9 - 10.3 mg/dL Final  . Total Protein 09/09/2015 6.9  6.5 - 8.1 g/dL Final  . Albumin 09/09/2015 3.5  3.5 - 5.0 g/dL Final  . AST 09/09/2015 21  15 - 41 U/L Final  . ALT 09/09/2015 16  14 - 54 U/L Final  . Alkaline Phosphatase 09/09/2015 107  38 - 126 U/L Final  . Total Bilirubin 09/09/2015 0.4  0.3 - 1.2 mg/dL Final  . GFR calc non Af Amer 09/09/2015 29* >60 mL/min Final  . GFR calc Af Amer 09/09/2015 34* >60 mL/min Final   Comment: (NOTE) The eGFR has been calculated using the CKD EPI equation. This calculation has not been  validated in all clinical situations. eGFR's persistently <60 mL/min signify possible Chronic Kidney Disease.   . Anion gap  09/09/2015 7  5 - 15 Final  . Magnesium 09/09/2015 2.0  1.7 - 2.4 mg/dL Final    Lab Results  Component Value Date   CA125 18.8 07/23/2014    ASSESSMENT: Carcinoma of endometrium  Stage IV disease.  Patient has failed  multiple treatment Significant response to  nivolulamab .   Reduce fentanyl patch to 25 g Continue normal saline thousand cc t the time of each treatment  Reevaluate patient in 4 weeks Consider PET scan  By and of October All lab data has been reviewed MEDICAL DECISION MAKING:   Abdominal pain may be due to fentanyl being decrease.  Diarrhea is not very significant but if it continues baby due to [facility] At present time patient does not have any diarrhea We will continue NIVOLULAMAB Repeat PET scan for reevaluation All lab data has been reviewed If abdominal pain continues patient was advised to increase fentanyl patch back to 37 g Cancer of uterus   Staging form: Corpus Uteri - Carcinoma, AJCC 7th Edition     Clinical: Stage IVB (T3b, N2, M1) - Signed by Forest Gleason, MD on 04/18/2015   Forest Gleason, MD   09/13/2015 9:57 AM

## 2015-09-15 ENCOUNTER — Telehealth: Payer: Self-pay | Admitting: *Deleted

## 2015-09-15 DIAGNOSIS — C541 Malignant neoplasm of endometrium: Secondary | ICD-10-CM

## 2015-09-15 NOTE — Telephone Encounter (Signed)
Patient states she is having "stomach trouble" again. States she is fatigued and having pain in stomach.  She has tried Miralax and stool softeners.  States she is coming in for IVF's tomorrow.  Is there something she can take for stomach either OTC or something that can be called in?

## 2015-09-15 NOTE — Telephone Encounter (Signed)
Called patient back. Per Magda Paganini, NP if patient is constipated we can call her in some lactuose.  If she is not constipated and still having the pain then we should see her tomorrow when she comes for IVF.  Patient states she is now having diarrhea but still  Having the pain.  Describes pain as 7" area in middle of stomach. Patient agrees to come in tomorrow to be seen by NP.

## 2015-09-16 ENCOUNTER — Inpatient Hospital Stay: Payer: Medicare Other

## 2015-09-16 ENCOUNTER — Inpatient Hospital Stay (HOSPITAL_BASED_OUTPATIENT_CLINIC_OR_DEPARTMENT_OTHER): Payer: Medicare Other | Admitting: Family Medicine

## 2015-09-16 VITALS — BP 130/68 | HR 84 | Temp 96.8°F | Wt 123.9 lb

## 2015-09-16 DIAGNOSIS — R197 Diarrhea, unspecified: Secondary | ICD-10-CM

## 2015-09-16 DIAGNOSIS — Z79899 Other long term (current) drug therapy: Secondary | ICD-10-CM

## 2015-09-16 DIAGNOSIS — C541 Malignant neoplasm of endometrium: Secondary | ICD-10-CM | POA: Diagnosis not present

## 2015-09-16 DIAGNOSIS — R109 Unspecified abdominal pain: Secondary | ICD-10-CM

## 2015-09-16 LAB — COMPREHENSIVE METABOLIC PANEL
ALK PHOS: 101 U/L (ref 38–126)
ALT: 14 U/L (ref 14–54)
AST: 20 U/L (ref 15–41)
Albumin: 3.2 g/dL — ABNORMAL LOW (ref 3.5–5.0)
Anion gap: 5 (ref 5–15)
BUN: 20 mg/dL (ref 6–20)
CALCIUM: 8.3 mg/dL — AB (ref 8.9–10.3)
CO2: 26 mmol/L (ref 22–32)
CREATININE: 1.61 mg/dL — AB (ref 0.44–1.00)
Chloride: 100 mmol/L — ABNORMAL LOW (ref 101–111)
GFR calc non Af Amer: 32 mL/min — ABNORMAL LOW (ref >60)
GFR, EST AFRICAN AMERICAN: 37 mL/min — AB (ref >60)
GLUCOSE: 135 mg/dL — AB (ref 65–99)
Potassium: 3.8 mmol/L (ref 3.5–5.1)
SODIUM: 131 mmol/L — AB (ref 135–145)
Total Bilirubin: 0.3 mg/dL (ref 0.3–1.2)
Total Protein: 6.2 g/dL — ABNORMAL LOW (ref 6.5–8.1)

## 2015-09-16 LAB — MAGNESIUM: Magnesium: 2.1 mg/dL (ref 1.7–2.4)

## 2015-09-16 MED ORDER — HEPARIN SOD (PORK) LOCK FLUSH 100 UNIT/ML IV SOLN
500.0000 [IU] | Freq: Once | INTRAVENOUS | Status: AC | PRN
  Administered 2015-09-16: 500 [IU]
  Filled 2015-09-16: qty 5

## 2015-09-16 MED ORDER — SODIUM CHLORIDE 0.9 % IV SOLN
Freq: Once | INTRAVENOUS | Status: AC
  Administered 2015-09-16: 15:00:00 via INTRAVENOUS
  Filled 2015-09-16: qty 1000

## 2015-09-16 MED ORDER — SODIUM CHLORIDE 0.9 % IV SOLN
Freq: Once | INTRAVENOUS | Status: AC
  Administered 2015-09-16: 16:00:00 via INTRAVENOUS
  Filled 2015-09-16: qty 4

## 2015-09-16 NOTE — Progress Notes (Signed)
Patient does have living will.  Never smoked.  Patient called yesterday with c/o constipation over the weekend and she took Miralax and stool softeners that threw her into diarrhea.  She continues to have diarrhea today with some formed stool at times.  She also complains of abdominal pain in her umbilical area that is 8/24 and she describes it as "bending her over".

## 2015-09-18 ENCOUNTER — Encounter
Admission: RE | Admit: 2015-09-18 | Discharge: 2015-09-18 | Disposition: A | Discharge status: Home / Self Care | Payer: Medicare Other | Source: Ambulatory Visit | Attending: Oncology | Admitting: Oncology

## 2015-09-18 ENCOUNTER — Inpatient Hospital Stay (HOSPITAL_BASED_OUTPATIENT_CLINIC_OR_DEPARTMENT_OTHER): Payer: Medicare Other | Admitting: Oncology

## 2015-09-18 VITALS — BP 131/62 | HR 80 | Temp 97.3°F | Wt 127.4 lb

## 2015-09-18 DIAGNOSIS — R109 Unspecified abdominal pain: Secondary | ICD-10-CM | POA: Diagnosis not present

## 2015-09-18 DIAGNOSIS — C541 Malignant neoplasm of endometrium: Secondary | ICD-10-CM | POA: Insufficient documentation

## 2015-09-18 DIAGNOSIS — C55 Malignant neoplasm of uterus, part unspecified: Secondary | ICD-10-CM

## 2015-09-18 DIAGNOSIS — Z79899 Other long term (current) drug therapy: Secondary | ICD-10-CM | POA: Diagnosis not present

## 2015-09-18 LAB — GLUCOSE, CAPILLARY: GLUCOSE-CAPILLARY: 71 mg/dL (ref 65–99)

## 2015-09-18 MED ORDER — FENTANYL 25 MCG/HR TD PT72: 25.0000 ug | MEDICATED_PATCH | TRANSDERMAL | Status: DC

## 2015-09-18 MED ORDER — FENTANYL 12 MCG/HR TD PT72: 12.5000 ug | MEDICATED_PATCH | TRANSDERMAL | Status: DC

## 2015-09-18 MED ORDER — FLUDEOXYGLUCOSE F - 18 (FDG) INJECTION
12.9800 | Freq: Once | INTRAVENOUS | Status: DC | PRN
  Administered 2015-09-18: 12.98 via INTRAVENOUS
  Filled 2015-09-18: qty 12.98

## 2015-09-18 NOTE — Progress Notes (Signed)
Patient here today for PET results. 

## 2015-09-19 ENCOUNTER — Encounter: Payer: Self-pay | Admitting: Oncology

## 2015-09-19 NOTE — Progress Notes (Signed)
[place] @ [facility] Telephone:([phone]) [phone]  Fax:(336) [phone]     Lorie Cleckley OB: May 08, 1951  MR#: [id number]  AOZ#:[id number]  [name], MD as PCP - General (Family Medicine) [name], MD (General Surgery)  CHIEF COMPLAINT:  Chief Complaint  Patient presents with  . Results    Chief Complaint/Diagnosis:   The patient is a 64 year old with metastatic endometrial cancer who is seen for assessment during ongoing palliative radiation therapy.  04/2011   papillary serous adenocarcinoma of the endometrium, stage IIIb (nodal metastasis)              TAHBSO and full staging,               GOG protocl using XRT and chemotherapy, significant XRT side-effects,               continued on chemotherapy off protocol, 04/2013   recurrence in the nodal areas,              DDP and docetaxel x 6 completed in December,   started on chemotherapy with Adriamycin(October, 2015) poor tolerance to letrozole  and affinator Estrogen and progesterone receptor negative tumor         hER-2/neu recetor negative. November 07, 2014 progressive disease by  tumor marker criteria and clinical examination November 12, 2014 After 6 cycles of chemotherapy because of increasing side effect chemotherapy was discontinued.  (March, 2016) Patient was started on cis-platinum and topotecan. May, 2016 Cis-platinum into Portec and was discontinued because of significant side effect and possibility of progressive disease.  Patient would be started on NIVOLULAMAB on compassionate use basis       Oncology Flowsheet 07/15/2015 07/29/2015 08/12/2015 08/26/2015 09/02/2015 09/09/2015 09/16/2015  diphenhydrAMINE (BENADRYL) PO - - - - - - -  metoCLOPramide (REGLAN) IV - - - - - - -  nivolumab (OPDIVO) IV 180 mg 180 mg 180 mg 180 mg - 240 mg -  ondansetron (ZOFRAN) IV - - [ 8 mg ] [ 8 mg ] [ 8 mg ] [ 8 mg ] [ 8 mg ]  promethazine (PHENERGAN) IV - - 25 mg - - - -    INTERVAL HISTORY:  64 year old lady with stage IV carcinoma of endometrium metastases to the lymph node.  Patient is here for ongoing evaluation and treatment consideration No chills.  No fever.  Feeling extremely weak and tired.  Patient had constipation followed by black stool.  No epigastric discomfort.  Poor appetite. Patient was evaluated few days before with diarrhea abdominal discomfort.  Patient received IV fluid.  Now has developed constipation diarrhea is improved.  Abdominal discomfort continues patient is taking pain medication around-the-clock.  Initially patient was on fentanyl patch 50 g which has been now decreased to 25 g last month and pain started after decreasing fentanyl patch. Feeling somewhat emotional.   PET scan done and here to discuss results and further planning of treatment PET scan has been reviewed independently  REVIEW OF SYSTEMS:    general status: Patient is feeling weak and tired.  No change in a performance status.  No chills.  No fever.  Feeling extremely weak and tired HEENT: Alopecia.  No evidence of stomatitis increasing discomfort on the left side of the neck Lungs: No cough or shortness of breath Cardiac: No chest pain or paroxysmal nocturnal dyspnea GI: No nausea no vomiting no diarrhea no abdominal pain On stay patient followed by black stool. Skin: No rash Lower extremity  no swelling Neurological system: No tingling.  No numbness.  No other focal signs Musculoskeletal system no bony pains poor appetite has lost approximately 5 pounds in last month After last month evaluation patient's condition is somewhat declining last few days with diarrhea abdominal discomfort Now has constipation patient again became very depressed Abdominal pain is now controlled but taking pain medication around-the-clock  PAST MEDICAL HISTORY: Past Medical History  Diagnosis Date  . Cancer ([place]) 2012  . GI problem 2013  . Cancer of uterus ([place]) 04/18/2015    PAST SURGICAL  HISTORY: Past Surgical History  Procedure Laterality Date  . Abdominal hysterectomy      FAMILY HISTORY No significant family history.      ADVANCED DIRECTIVES: Patient does have advance directive   HEALTH MAINTENANCE: Social History  Substance Use Topics  . Smoking status: Never Smoker   . Smokeless tobacco: Never Used  . Alcohol Use: Yes      Allergies  Allergen Reactions  . Penicillins Rash    Current Outpatient Prescriptions  Medication Sig Dispense Refill  . ALPRAZolam (XANAX) 0.25 MG tablet Take 1 tablet (0.25 mg total) by mouth 2 (two) times daily as needed for anxiety. 30 tablet 3  . bismuth subsalicylate (PEPTO BISMOL) 262 MG chewable tablet Chew 524 mg by mouth as needed for indigestion.     . cholestyramine (QUESTRAN) 4 G packet Take 1 packet by mouth 3 (three) times daily as needed.     . citalopram (CELEXA) 40 MG tablet Take 1 tablet (40 mg total) by mouth daily. 30 tablet 3  . fentaNYL (DURAGESIC - DOSED MCG/HR) 25 MCG/HR patch Place 1 patch (25 mcg total) onto the skin every 3 (three) days. Use with 12.63mg patch for total dose 37.534m. 10 patch 0  . fexofenadine (ALLEGRA) 180 MG tablet Take 180 mg by mouth as needed.     . fluticasone (FLONASE) 50 MCG/ACT nasal spray Place 1-2 sprays into the nose daily as needed.    . Marland KitchenYDROcodone-acetaminophen (NORCO/VICODIN) 5-325 MG tablet Take 1 tablet by mouth every 6 (six) hours as needed for moderate pain. 60 tablet 0  . lidocaine-prilocaine (EMLA) cream Apply to affected area once 30 g 3  . megestrol (MEGACE) 400 MG/10ML suspension Take 10 mLs (400 mg total) by mouth daily. 300 mL 6  . metoCLOPramide (REGLAN) 10 MG tablet Take 10 mg by mouth 4 (four) times daily.    . Marland Kitchenmeprazole (PRILOSEC) 20 MG capsule     . omeprazole (PRILOSEC) 20 MG capsule TAKE 1 CAPSULE BY MOUTH TWICE DAILY BEFORE MEALS 60 capsule 0  . ondansetron (ZOFRAN) 4 MG tablet Take 1 tablet (4 mg total) by mouth every 6 (six) hours as needed. 60 tablet  3  . polyethylene glycol powder (GLYCOLAX/MIRALAX) powder Take by mouth.    . potassium chloride (K-DUR) 10 MEQ tablet Take 1 tablet (10 mEq total) by mouth daily. 30 tablet 3  . ranitidine (ZANTAC) 150 MG tablet Take 1 tablet by mouth 2 (two) times daily.    . sertraline (ZOLOFT) 25 MG tablet Take 25 mg by mouth daily.    . fentaNYL (DURAGESIC - DOSED MCG/HR) 12 MCG/HR Place 1 patch (12.5 mcg total) onto the skin every 3 (three) days. Use with 2534mpatch for total dose of 37.5mc66m10 patch 0   No current facility-administered medications for this visit.   Facility-Administered Medications Ordered in Other Visits  Medication Dose Route Frequency Provider Last Rate Last Dose  . fludeoxyglucose F -  18 (FDG) injection 12.98 milli Curie  12.98 milli Curie Intravenous Once PRN Forest Gleason, MD   12.98 milli Curie at 09/18/15 1235  . sodium chloride 0.9 % injection 10 mL  10 mL Intracatheter PRN Forest Gleason, MD   10 mL at 05/06/15 1028  . sodium chloride 0.9 % injection 10 mL  10 mL Intravenous PRN Forest Gleason, MD        OBJECTIVE:  Filed Vitals:   09/18/15 1549  BP: 131/62  Pulse: 80  Temp: 97.3 F (36.3 C)     Body mass index is 22.58 kg/(m^2).    ECOG FS:1 - Symptomatic but completely ambulatory  PHYSICAL EXAM: Gen. status: Alert and oriented lady not any acute distress Patient is very emotional with frequent crying spells Eyes: Pale-looking sclera and conjunctiva HEENT: Left supraclavicular  Mass is not palpable at present time Cardiac exam revealed the PMI to be normally situated and sized. The rhythm was regular and no extrasystoles were noted during several minutes of auscultation. The first and second heart sounds were normal and physiologic splitting of the second heart sound was noted. There were no murmurs, rubs, clicks, or gallops.. Examination of the chest was unremarkable. There were no bony deformities, no asymmetry, and no other abnormalities.. Neurologically, the patient  was awake, alert, and oriented to person, place and time. There were no obvious focal neurologic abnormalities. Examination of the skin revealed no evidence of significant rashes, suspicious appearing nevi or other concerning lesions. Abdomen: Slight tenderness no palpable masses bowel sounds are present Lower extremityswelling. Psychiatric system: Patient is   depressed LAB RESULTS:  Hospital Outpatient Visit on 09/18/2015  Component Date Value Ref Range Status  . Glucose-Capillary 09/18/2015 71  65 - 99 mg/dL Final  Infusion on 09/16/2015  Component Date Value Ref Range Status  . Sodium 09/16/2015 131* 135 - 145 mmol/L Final  . Potassium 09/16/2015 3.8  3.5 - 5.1 mmol/L Final  . Chloride 09/16/2015 100* 101 - 111 mmol/L Final  . CO2 09/16/2015 26  22 - 32 mmol/L Final  . Glucose, Bld 09/16/2015 135* 65 - 99 mg/dL Final  . BUN 09/16/2015 20  6 - 20 mg/dL Final  . Creatinine, Ser 09/16/2015 1.61* 0.44 - 1.00 mg/dL Final  . Calcium 09/16/2015 8.3* 8.9 - 10.3 mg/dL Final  . Total Protein 09/16/2015 6.2* 6.5 - 8.1 g/dL Final  . Albumin 09/16/2015 3.2* 3.5 - 5.0 g/dL Final  . AST 09/16/2015 20  15 - 41 U/L Final  . ALT 09/16/2015 14  14 - 54 U/L Final  . Alkaline Phosphatase 09/16/2015 101  38 - 126 U/L Final  . Total Bilirubin 09/16/2015 0.3  0.3 - 1.2 mg/dL Final  . GFR calc non Af Amer 09/16/2015 32* >60 mL/min Final  . GFR calc Af Amer 09/16/2015 37* >60 mL/min Final   Comment: (NOTE) The eGFR has been calculated using the CKD EPI equation. This calculation has not been validated in all clinical situations. eGFR's persistently <60 mL/min signify possible Chronic Kidney Disease.   . Anion gap 09/16/2015 5  5 - 15 Final  . Magnesium 09/16/2015 2.1  1.7 - 2.4 mg/dL Final    Lab Results  Component Value Date   CA125 18.8 07/23/2014    ASSESSMENT: Carcinoma of endometrium  Stage IV disease.  Patient has failed  multiple treatment Significant response to  nivolulamab .  PET  scan has been reviewed independently and so stable disease with some improvement in retroperitoneal lymph node Some  increase in some other lymphadenopathy with metabolic activity has increased  MEDICAL DECISION MAKING:   Diarrhea could be secondary to [facility] at present time that has resolved and patient has now constipation patient was advised to go back on MiraLAX Abdominal pain we will increase fentanyl patch to 37 g if needed to 50 g While patient was advised to continue breakthrough pain medication around-the-clock Patient continues to very depressed PET scan has been reviewed independently stow so stable disease we may continue NIVOLULAMAB by next week if diarrhea has resolved All of the lab data has been reviewed Consider antidepressant medication by next week if patient's general condition has not improved.  And patient continues to be depressed.  Patient was quite emotional and crying today Cancer of uterus   Staging form: Corpus Uteri - Carcinoma, AJCC 7th Edition     Clinical: Stage IVB (T3b, N2, M1) - Signed by Forest Gleason, MD on 04/18/2015   Forest Gleason, MD   09/19/2015 9:34 AM

## 2015-09-22 ENCOUNTER — Ambulatory Visit: Payer: Medicare Other

## 2015-09-22 NOTE — Progress Notes (Signed)
[place]  Telephone:(336) [phone]  Fax:(336) [phone]     [name] Graham DOB: Oct 21, 1951  MR#: [id number]  MGQ#:[id number]  [name], MD as PCP - General (Family Medicine) [name], MD (General Surgery)  CHIEF COMPLAINT:  Chief Complaint  Patient presents with  . Diarrhea    INTERVAL HISTORY:  Patient is here as an acute add on regarding increasing abdominal pain, diarrhea alternating with some constipation. Patient reports using oral medication for abdominal pain but states that she is doubled over when pain hits and most of the time she is unable to get pain medication in before her pain resolves. She is past medical history significant for stage IV endometrial carcinoma.  REVIEW OF SYSTEMS:   Review of Systems  Constitutional: Negative for fever, chills, weight loss, malaise/fatigue and diaphoresis.  HENT: Negative for congestion, ear discharge, ear pain, hearing loss, nosebleeds, sore throat and tinnitus.   Eyes: Negative for blurred vision, double vision, photophobia, pain, discharge and redness.  Respiratory: Negative for cough, hemoptysis, sputum production, shortness of breath, wheezing and stridor.   Cardiovascular: Negative for chest pain, palpitations, orthopnea, claudication, leg swelling and PND.  Gastrointestinal: Positive for abdominal pain, diarrhea and constipation. Negative for heartburn, nausea, vomiting, blood in stool and melena.  Genitourinary: Negative.   Musculoskeletal: Negative.   Skin: Negative.   Neurological: Negative for dizziness, tingling, focal weakness, seizures, weakness and headaches.  Endo/Heme/Allergies: Does not bruise/bleed easily.  Psychiatric/Behavioral: Negative for depression. The patient is not nervous/anxious and does not have insomnia.     As per HPI. Otherwise, a complete review of systems is negatve.  ONCOLOGY HISTORY: Oncology History   Chief Complaint/Diagnosis:   The  patient is a 64 year old with metastatic endometrial cancer who is seen for assessment during ongoing palliative radiation therapy.  04/2011   papillary serous adenocarcinoma of the endometrium, stage IIIb (nodal metastasis)              TAHBSO and full staging,               GOG protocl using XRT and chemotherapy, significant XRT side-effects,               continued on chemotherapy off protocol, 04/2013   recurrence in the nodal areas,              DDP and docetaxel x 6 completed in December,   started on chemotherapy with Adriamycin(October, 2015) poor tolerance to letrozole  and affinator Estrogen and progesterone receptor negative tumor         hER-2/neu recetor negative. November 07, 2014 progressive disease by  tumor marker criteria and clinical examination November 12, 2014 After 6 cycles of chemotherapy because of increasing side effect chemotherapy was discontinued.  (March, 2016) Patient was started on cis-platinum and topotecan. May, 2016 Cis-platinum into Portec and was discontinued because of significant side effect and possibility of progressive disease.  Patient would be started on NIVOLULAMAB on compassionate use basis     Cancer of uterus ([place])   04/18/2015 Initial Diagnosis Cancer of uterus    PAST MEDICAL HISTORY: Past Medical History  Diagnosis Date  . Cancer ([place]) 2012  . GI problem 2013  . Cancer of uterus ([place]) 04/18/2015    PAST SURGICAL HISTORY: Past Surgical History  Procedure Laterality Date  . Abdominal hysterectomy      FAMILY HISTORY No family history on file.  GYNECOLOGIC HISTORY:  No LMP recorded. Patient has had  a hysterectomy.     ADVANCED DIRECTIVES:    HEALTH MAINTENANCE: Social History  Substance Use Topics  . Smoking status: Never Smoker   . Smokeless tobacco: Never Used  . Alcohol Use: Yes     Colonoscopy:  PAP:  Bone density:  Lipid panel:  Allergies  Allergen Reactions  . Penicillins Rash    Current Outpatient  Prescriptions  Medication Sig Dispense Refill  . ALPRAZolam (XANAX) 0.25 MG tablet Take 1 tablet (0.25 mg total) by mouth 2 (two) times daily as needed for anxiety. 30 tablet 3  . bismuth subsalicylate (PEPTO BISMOL) 262 MG chewable tablet Chew 524 mg by mouth as needed for indigestion.     . cholestyramine (QUESTRAN) 4 G packet Take 1 packet by mouth 3 (three) times daily as needed.     . citalopram (CELEXA) 40 MG tablet Take 1 tablet (40 mg total) by mouth daily. 30 tablet 3  . fexofenadine (ALLEGRA) 180 MG tablet Take 180 mg by mouth as needed.     . fluticasone (FLONASE) 50 MCG/ACT nasal spray Place 1-2 sprays into the nose daily as needed.    Marland Kitchen HYDROcodone-acetaminophen (NORCO/VICODIN) 5-325 MG tablet Take 1 tablet by mouth every 6 (six) hours as needed for moderate pain. 60 tablet 0  . lidocaine-prilocaine (EMLA) cream Apply to affected area once 30 g 3  . megestrol (MEGACE) 400 MG/10ML suspension Take 10 mLs (400 mg total) by mouth daily. 300 mL 6  . metoCLOPramide (REGLAN) 10 MG tablet Take 10 mg by mouth 4 (four) times daily.    Marland Kitchen omeprazole (PRILOSEC) 20 MG capsule     . omeprazole (PRILOSEC) 20 MG capsule TAKE 1 CAPSULE BY MOUTH TWICE DAILY BEFORE MEALS 60 capsule 0  . ondansetron (ZOFRAN) 4 MG tablet Take 1 tablet (4 mg total) by mouth every 6 (six) hours as needed. 60 tablet 3  . polyethylene glycol powder (GLYCOLAX/MIRALAX) powder Take by mouth.    . potassium chloride (K-DUR) 10 MEQ tablet Take 1 tablet (10 mEq total) by mouth daily. 30 tablet 3  . ranitidine (ZANTAC) 150 MG tablet Take 1 tablet by mouth 2 (two) times daily.    . sertraline (ZOLOFT) 25 MG tablet Take 25 mg by mouth daily.    . fentaNYL (DURAGESIC - DOSED MCG/HR) 12 MCG/HR Place 1 patch (12.5 mcg total) onto the skin every 3 (three) days. Use with 48mg patch for total dose of 37.536m. 10 patch 0  . fentaNYL (DURAGESIC - DOSED MCG/HR) 25 MCG/HR patch Place 1 patch (25 mcg total) onto the skin every 3 (three) days.  Use with 12.33m34mpatch for total dose 37.33mc27m10 patch 0   No current facility-administered medications for this visit.   Facility-Administered Medications Ordered in Other Visits  Medication Dose Route Frequency Provider Last Rate Last Dose  . fludeoxyglucose F - 18 (FDG) injection 12.98 milli Curie  12.98 milli Curie Intravenous Once PRN JanaForest Gleason   12.98 milli Curie at 09/18/15 1235  . sodium chloride 0.9 % injection 10 mL  10 mL Intracatheter PRN JanaForest Gleason   10 mL at 05/06/15 1028  . sodium chloride 0.9 % injection 10 mL  10 mL Intravenous PRN JanaForest Gleason        OBJECTIVE: BP 130/68 mmHg  Pulse 84  Temp(Src) 96.8 F (36 C) (Tympanic)  Wt 123 lb 14.4 oz (56.2 kg)   Body mass index is 21.95 kg/(m^2).    ECOG FS:1 - Symptomatic but completely ambulatory  General: Well-developed, well-nourished, no acute distress. Eyes: Pink conjunctiva, anicteric sclera. HEENT: Normocephalic, moist mucous membranes, clear oropharnyx. Lungs: Clear to auscultation bilaterally. Heart: Regular rate and rhythm. No rubs, murmurs, or gallops. Abdomen: Soft, tender throughout lower left, right and upper right quadrants, nondistended. No organomegaly noted, normoactive bowel sounds. Musculoskeletal: No edema, cyanosis, or clubbing. Neuro: Alert, answering all questions appropriately. Cranial nerves grossly intact. Skin: No rashes or petechiae noted. Psych: Normal affect.    LAB RESULTS:  Infusion on 09/16/2015  Component Date Value Ref Range Status  . Sodium 09/16/2015 131* 135 - 145 mmol/L Final  . Potassium 09/16/2015 3.8  3.5 - 5.1 mmol/L Final  . Chloride 09/16/2015 100* 101 - 111 mmol/L Final  . CO2 09/16/2015 26  22 - 32 mmol/L Final  . Glucose, Bld 09/16/2015 135* 65 - 99 mg/dL Final  . BUN 09/16/2015 20  6 - 20 mg/dL Final  . Creatinine, Ser 09/16/2015 1.61* 0.44 - 1.00 mg/dL Final  . Calcium 09/16/2015 8.3* 8.9 - 10.3 mg/dL Final  . Total Protein 09/16/2015 6.2* 6.5 -  8.1 g/dL Final  . Albumin 09/16/2015 3.2* 3.5 - 5.0 g/dL Final  . AST 09/16/2015 20  15 - 41 U/L Final  . ALT 09/16/2015 14  14 - 54 U/L Final  . Alkaline Phosphatase 09/16/2015 101  38 - 126 U/L Final  . Total Bilirubin 09/16/2015 0.3  0.3 - 1.2 mg/dL Final  . GFR calc non Af Amer 09/16/2015 32* >60 mL/min Final  . GFR calc Af Amer 09/16/2015 37* >60 mL/min Final   Comment: (NOTE) The eGFR has been calculated using the CKD EPI equation. This calculation has not been validated in all clinical situations. eGFR's persistently <60 mL/min signify possible Chronic Kidney Disease.   . Anion gap 09/16/2015 5  5 - 15 Final  . Magnesium 09/16/2015 2.1  1.7 - 2.4 mg/dL Final    STUDIES: Nm Pet Image Restag (ps) Skull Base To Thigh  09/18/2015  CLINICAL DATA:  Subsequent treatment strategy for endometrial carcinoma. Recent chemotherapy. EXAM: NUCLEAR MEDICINE PET SKULL BASE TO THIGH TECHNIQUE: 13.0 mCi F-18 FDG was injected intravenously. Full-ring PET imaging was performed from the skull base to thigh after the radiotracer. CT data was obtained and used for attenuation correction and anatomic localization. FASTING BLOOD GLUCOSE:  Value: 71 mg/dl COMPARISON:  07/14/2015 FINDINGS: NECK Left level 4 cervical lymphadenopathy currently measures 2.5 x 3.1 cm on image 42/series 4, and now appears completely solid. The previously seen cystic component measuring 5.6 cm on prior study is no longer visualized, however solid component has increased in size compared to 1.9 cm on prior exam. This has SUV max of 13.0 compared to 10.0 on previous exam. No other hypermetabolic cervical lymph nodes or masses identified. CHEST No hypermetabolic mediastinal or hilar nodes. No suspicious pulmonary nodules on the CT scan. ABDOMEN/PELVIS No abnormal hypermetabolic activity within the liver, pancreas, adrenal glands, or spleen. Hypermetabolic retroperitoneal lymphadenopathy encasing the abdominal aorta is stable in size  measuring 2.4 cm in short axis on image 137/series 4. Adjacent lymphadenopathy in the small bowel mesentery is seen measuring 15 mm on image 135 compared to 12 mm previously. Combined SUV max of this lymphadenopathy is 21.3 compared to 21.9 previously . No other hypermetabolic lymphadenopathy identified. SKELETON No focal hypermetabolic activity to suggest skeletal metastasis. IMPRESSION: Left level 4 cervical lymphadenopathy shows decrease in overall size of previously seen cystic component, however solid component shows mild increase in both size and hypermetabolic activity.  Slight increase in mild central mesenteric lymphadenopathy, although adjacent retroperitoneal lymphadenopathy is unchanged. Overall degree of hypermetabolic activity shows no significant change. No new sites of metastatic disease identified. Electronically Signed   By: Earle Gell M.D.   On: 09/18/2015 16:18    ASSESSMENT:  Carcinoma of the endometrium, stage IV disease. Abdominal pain.  PLAN:  1. Endometrial CA, stage IV disease. Patient was previously on Nivolumab. Her most recent dose was 09/09/2015. 2. Abdominal pain/diarrhea. Advised patient that diarrhea could be related to Nivolumab infusions. She reports having intermittent diarrhea with some constipation and is worried about having IBS. Also with increasing abdominal pain there's concern for recurrent disease. Patient is due for a PET scan, we will have PET scan moved up to later this week for reevaluation for progressive disease. We will have patient follow-up with Dr. Oliva Bustard following PET scan. Patient advised to continue with pain medication as currently prescribed. Discussed increasing Duragesic patches.  Patient expressed understanding and was in agreement with this plan. She also understands that She can call clinic at any time with any questions, concerns, or complaints.   Dr. Oliva Bustard was available for consultation and review of plan of care for this  patient.  Cancer of uterus [facility])   Staging form: Corpus Uteri - Carcinoma, AJCC 7th Edition     Clinical: Stage IVB (T3b, N2, M1) - Signed by Forest Gleason, MD on 04/18/2015   Evlyn Kanner, NP   09/22/2015 11:05 AM

## 2015-09-23 ENCOUNTER — Inpatient Hospital Stay (HOSPITAL_BASED_OUTPATIENT_CLINIC_OR_DEPARTMENT_OTHER): Payer: Medicare Other | Admitting: Oncology

## 2015-09-23 ENCOUNTER — Inpatient Hospital Stay: Payer: Medicare Other

## 2015-09-23 ENCOUNTER — Encounter: Payer: Self-pay | Admitting: Oncology

## 2015-09-23 VITALS — BP 135/68 | HR 83 | Temp 98.4°F | Wt 127.4 lb

## 2015-09-23 DIAGNOSIS — C55 Malignant neoplasm of uterus, part unspecified: Secondary | ICD-10-CM

## 2015-09-23 DIAGNOSIS — C541 Malignant neoplasm of endometrium: Secondary | ICD-10-CM

## 2015-09-23 DIAGNOSIS — R21 Rash and other nonspecific skin eruption: Secondary | ICD-10-CM | POA: Diagnosis not present

## 2015-09-23 DIAGNOSIS — R197 Diarrhea, unspecified: Secondary | ICD-10-CM | POA: Diagnosis not present

## 2015-09-23 DIAGNOSIS — Z79899 Other long term (current) drug therapy: Secondary | ICD-10-CM

## 2015-09-23 DIAGNOSIS — R109 Unspecified abdominal pain: Secondary | ICD-10-CM | POA: Diagnosis not present

## 2015-09-23 LAB — CBC WITH DIFFERENTIAL/PLATELET
Basophils Absolute: 0 10*3/uL (ref 0–0.1)
Basophils Relative: 1 %
EOS PCT: 5 %
Eosinophils Absolute: 0.2 10*3/uL (ref 0–0.7)
HEMATOCRIT: 23.9 % — AB (ref 35.0–47.0)
Hemoglobin: 8 g/dL — ABNORMAL LOW (ref 12.0–16.0)
LYMPHS PCT: 11 %
Lymphs Abs: 0.5 10*3/uL — ABNORMAL LOW (ref 1.0–3.6)
MCH: 29.3 pg (ref 26.0–34.0)
MCHC: 33.6 g/dL (ref 32.0–36.0)
MCV: 87.3 fL (ref 80.0–100.0)
MONO ABS: 0.4 10*3/uL (ref 0.2–0.9)
MONOS PCT: 8 %
NEUTROS ABS: 3.9 10*3/uL (ref 1.4–6.5)
Neutrophils Relative %: 77 %
PLATELETS: 264 10*3/uL (ref 150–440)
RBC: 2.74 MIL/uL — ABNORMAL LOW (ref 3.80–5.20)
RDW: 13.5 % (ref 11.5–14.5)
WBC: 5.1 10*3/uL (ref 3.6–11.0)

## 2015-09-23 MED ORDER — TRIAMCINOLONE 0.1 % CREAM:EUCERIN CREAM 1:1: 1.0000 "application " | TOPICAL_CREAM | Freq: Two times a day (BID) | CUTANEOUS | Status: DC

## 2015-09-23 MED ORDER — SODIUM CHLORIDE 0.9 % IV SOLN
Freq: Once | INTRAVENOUS | Status: AC
  Administered 2015-09-23: 15:00:00 via INTRAVENOUS
  Filled 2015-09-23: qty 1000

## 2015-09-23 MED ORDER — SODIUM CHLORIDE 0.9 % IV SOLN
240.0000 mg | Freq: Once | INTRAVENOUS | Status: AC
  Administered 2015-09-23: 240 mg via INTRAVENOUS
  Filled 2015-09-23: qty 24

## 2015-09-23 MED ORDER — HEPARIN SOD (PORK) LOCK FLUSH 100 UNIT/ML IV SOLN
500.0000 [IU] | Freq: Once | INTRAVENOUS | Status: AC | PRN
  Filled 2015-09-23: qty 5

## 2015-09-23 NOTE — Progress Notes (Signed)
[place] @ [facility] Telephone:(336) [phone]  Fax:(336) [phone]     Florabelle Cardin OB: [birth year]  MR#: [id number]  GDJ#:[id number]  [name], MD as PCP - General (Family Medicine) [name], MD (General Surgery)  CHIEF COMPLAINT:  Chief Complaint  Patient presents with  . OTHER    Chief Complaint/Diagnosis:   The patient is a 64 year old with metastatic endometrial cancer who is seen for assessment during ongoing palliative radiation therapy.  04/2011   papillary serous adenocarcinoma of the endometrium, stage IIIb (nodal metastasis)              TAHBSO and full staging,               GOG protocl using XRT and chemotherapy, significant XRT side-effects,               continued on chemotherapy off protocol, 04/2013   recurrence in the nodal areas,              DDP and docetaxel x 6 completed in December,   started on chemotherapy with Adriamycin(October, 2015) poor tolerance to letrozole  and affinator Estrogen and progesterone receptor negative tumor         hER-2/neu recetor negative. November 07, 2014 progressive disease by  tumor marker criteria and clinical examination November 12, 2014 After 6 cycles of chemotherapy because of increasing side effect chemotherapy was discontinued.  (March, 2016) Patient was started on cis-platinum and topotecan. May, 2016 Cis-platinum into Portec and was discontinued because of significant side effect and possibility of progressive disease.  Patient would be started on NIVOLULAMAB on compassionate use basis       Oncology Flowsheet 07/15/2015 07/29/2015 08/12/2015 08/26/2015 09/02/2015 09/09/2015 09/16/2015  diphenhydrAMINE (BENADRYL) PO - - - - - - -  metoCLOPramide (REGLAN) IV - - - - - - -  nivolumab (OPDIVO) IV 180 mg 180 mg 180 mg 180 mg - 240 mg -  ondansetron (ZOFRAN) IV - - [ 8 mg ] [ 8 mg ] [ 8 mg ] [ 8 mg ] [ 8 mg ]  promethazine (PHENERGAN) IV - - 25 mg - - - -    INTERVAL HISTORY:  64 year old lady with stage IV carcinoma of endometrium metastases to the lymph node.  Patient is here for ongoing evaluation and treatment consideration No chills.  No fever.  Feeling extremely weak and tired.  Patient had constipation followed by black stool.  No epigastric discomfort.  Poor appetite. Patient was evaluated few days before with diarrhea abdominal discomfort.  Patient received IV fluid.  Now has developed constipation diarrhea is improved.  Abdominal discomfort continues patient is taking pain medication around-the-clock.  Initially patient was on fentanyl patch 50 g which has been now decreased to 25 g last month and pain started after decreasing fentanyl patch. Feeling somewhat emotional.   PET scan done and here to discuss results and further planning of treatment PET scan has been reviewed independently September 23, 2015 Patient is here today continue chemotherapy with NIVOLULAMAB Abdominal pain is improved Diarrhea is improved However patient has a rash in the back and abdominal area 's macular rash Patient is taking Benadryl and Zyrtec without much relief Itching .  No chills or fever REVIEW OF SYSTEMS:    general status: Patient is feeling weak and tired.  No change in a performance status.  No chills.  No fever.  Feeling extremely weak and tired HEENT: Alopecia.  No evidence of  stomatitis increasing discomfort on the left side of the neck Lungs: No cough or shortness of breath Cardiac: No chest pain or paroxysmal nocturnal dyspnea GI: No nausea no vomiting no diarrhea no abdominal pain On stay patient followed by black stool. Skin: Macular rash grade 2 Lower extremity no swelling Neurological system: No tingling.  No numbness.  No other focal signs Musculoskeletal system no bony pains poor appetite has lost approximately 5 pounds in last month After last month evaluation patient's condition is somewhat declining last few days with diarrhea abdominal  discomfort Now has constipation patient again became very depressed Abdominal pain is now controlled but taking pain medication around-the-clock  PAST MEDICAL HISTORY: Past Medical History  Diagnosis Date  . Cancer ([place]) 2012  . GI problem 2013  . Cancer of uterus ([place]) 04/18/2015    PAST SURGICAL HISTORY: Past Surgical History  Procedure Laterality Date  . Abdominal hysterectomy      FAMILY HISTORY No significant family history.      ADVANCED DIRECTIVES: Patient does have advance directive   HEALTH MAINTENANCE: Social History  Substance Use Topics  . Smoking status: Never Smoker   . Smokeless tobacco: Never Used  . Alcohol Use: Yes      Allergies  Allergen Reactions  . Penicillins Rash    Current Outpatient Prescriptions  Medication Sig Dispense Refill  . ALPRAZolam (XANAX) 0.25 MG tablet Take 1 tablet (0.25 mg total) by mouth 2 (two) times daily as needed for anxiety. 30 tablet 3  . bismuth subsalicylate (PEPTO BISMOL) 262 MG chewable tablet Chew 524 mg by mouth as needed for indigestion.     . cholestyramine (QUESTRAN) 4 G packet Take 1 packet by mouth 3 (three) times daily as needed.     . citalopram (CELEXA) 40 MG tablet Take 1 tablet (40 mg total) by mouth daily. 30 tablet 3  . fentaNYL (DURAGESIC - DOSED MCG/HR) 12 MCG/HR Place 1 patch (12.5 mcg total) onto the skin every 3 (three) days. Use with 77mg patch for total dose of 37.568m. 10 patch 0  . fentaNYL (DURAGESIC - DOSED MCG/HR) 25 MCG/HR patch Place 1 patch (25 mcg total) onto the skin every 3 (three) days. Use with 12.10m60mpatch for total dose 37.10mc510m10 patch 0  . fexofenadine (ALLEGRA) 180 MG tablet Take 180 mg by mouth as needed.     . fluticasone (FLONASE) 50 MCG/ACT nasal spray Place 1-2 sprays into the nose daily as needed.    . HYMarland KitchenROcodone-acetaminophen (NORCO/VICODIN) 5-325 MG tablet Take 1 tablet by mouth every 6 (six) hours as needed for moderate pain. 60 tablet 0  . lidocaine-prilocaine  (EMLA) cream Apply to affected area once 30 g 3  . megestrol (MEGACE) 400 MG/10ML suspension Take 10 mLs (400 mg total) by mouth daily. 300 mL 6  . metoCLOPramide (REGLAN) 10 MG tablet Take 10 mg by mouth 4 (four) times daily.    . omMarland Kitchenprazole (PRILOSEC) 20 MG capsule     . omeprazole (PRILOSEC) 20 MG capsule TAKE 1 CAPSULE BY MOUTH TWICE DAILY BEFORE MEALS 60 capsule 0  . ondansetron (ZOFRAN) 4 MG tablet Take 1 tablet (4 mg total) by mouth every 6 (six) hours as needed. 60 tablet 3  . polyethylene glycol powder (GLYCOLAX/MIRALAX) powder Take by mouth.    . potassium chloride (K-DUR) 10 MEQ tablet Take 1 tablet (10 mEq total) by mouth daily. 30 tablet 3  . ranitidine (ZANTAC) 150 MG tablet Take 1 tablet by mouth 2 (two) times daily.    .Marland Kitchen  sertraline (ZOLOFT) 25 MG tablet Take 25 mg by mouth daily.     No current facility-administered medications for this visit.   Facility-Administered Medications Ordered in Other Visits  Medication Dose Route Frequency Provider Last Rate Last Dose  . fludeoxyglucose F - 18 (FDG) injection 12.98 milli Curie  12.98 milli Curie Intravenous Once PRN Forest Gleason, MD   12.98 milli Curie at 09/18/15 1235  . sodium chloride 0.9 % injection 10 mL  10 mL Intracatheter PRN Forest Gleason, MD   10 mL at 05/06/15 1028  . sodium chloride 0.9 % injection 10 mL  10 mL Intravenous PRN Forest Gleason, MD        OBJECTIVE:  Filed Vitals:   09/23/15 1355  BP: 135/68  Pulse: 83  Temp: 98.4 F (36.9 C)     Body mass index is 22.58 kg/(m^2).    ECOG FS:1 - Symptomatic but completely ambulatory  PHYSICAL EXAM: Gen. status: Alert and oriented lady not any acute distress Patient is very emotional with frequent crying spells Eyes: Pale-looking sclera and conjunctiva HEENT: Left supraclavicular  Mass is not palpable at present time Cardiac exam revealed the PMI to be normally situated and sized. The rhythm was regular and no extrasystoles were noted during several minutes of  auscultation. The first and second heart sounds were normal and physiologic splitting of the second heart sound was noted. There were no murmurs, rubs, clicks, or gallops.. Examination of the chest was unremarkable. There were no bony deformities, no asymmetry, and no other abnormalities.. Neurologically, the patient was awake, alert, and oriented to person, place and time. There were no obvious focal neurologic abnormalities. Examination of the skin revealed no evidence of significant rashes, suspicious appearing nevi or other concerning lesions. Abdomen: Slight tenderness no palpable masses bowel sounds are present Lower extremity no swelling Skin: Rash macular in the back and in the abdominal area Psychiatric system: Patient is   depressed LAB RESULTS:  Infusion on 09/23/2015  Component Date Value Ref Range Status  . WBC 09/23/2015 5.1  3.6 - 11.0 K/uL Final   A-LINE DRAW  . RBC 09/23/2015 2.74* 3.80 - 5.20 MIL/uL Final  . Hemoglobin 09/23/2015 8.0* 12.0 - 16.0 g/dL Final  . HCT 09/23/2015 23.9* 35.0 - 47.0 % Final  . MCV 09/23/2015 87.3  80.0 - 100.0 fL Final  . MCH 09/23/2015 29.3  26.0 - 34.0 pg Final  . MCHC 09/23/2015 33.6  32.0 - 36.0 g/dL Final  . RDW 09/23/2015 13.5  11.5 - 14.5 % Final  . Platelets 09/23/2015 264  150 - 440 K/uL Final  . Neutrophils Relative % 09/23/2015 77   Final  . Neutro Abs 09/23/2015 3.9  1.4 - 6.5 K/uL Final  . Lymphocytes Relative 09/23/2015 11   Final  . Lymphs Abs 09/23/2015 0.5* 1.0 - 3.6 K/uL Final  . Monocytes Relative 09/23/2015 8   Final  . Monocytes Absolute 09/23/2015 0.4  0.2 - 0.9 K/uL Final  . Eosinophils Relative 09/23/2015 5   Final  . Eosinophils Absolute 09/23/2015 0.2  0 - 0.7 K/uL Final  . Basophils Relative 09/23/2015 1   Final  . Basophils Absolute 09/23/2015 0.0  0 - 0.1 K/uL Final    Lab Results  Component Value Date   CA125 18.8 07/23/2014    ASSESSMENT: Carcinoma of endometrium  Stage IV disease.  Patient has failed   multiple treatment Significant response to  nivolulamab .  PET scan has been reviewed independently and so stable disease  with some improvement in retroperitoneal lymph node Some increase in some other lymphadenopathy with metabolic activity has increased Rash most likely due to medication possibility of that being NIVOLULAMAB cannot be ruled out At this point in time will start patient on Eucerin with aristicort  t cream If rash does not get better then possibility of  discontinuing or holding NIVOLULAMAB been giving short course of prednisone may be considered. MEDICAL DECISION MAKING:   Diarrhea has improved Abdominal pain is better Rashes or new symptoms on Zyrtec and Benadryl Try Eucerin cream with steroid If not better consider holding NIVOLULAMAB and trying prednisone by mouth Continue NIVOLULAMAB daily and reevaluate patient in 2 weeks Continue same pain medication Cancer of uterus   Staging form: Corpus Uteri - Carcinoma, AJCC 7th Edition     Clinical: Stage IVB (T3b, N2, M1) - Signed by Forest Gleason, MD on 04/18/2015   Forest Gleason, MD   09/23/2015 2:12 PM

## 2015-09-23 NOTE — Progress Notes (Signed)
Patient states she continues to have rash that is across her back and buttocks.  She is using hydrocortisone cream, zyrtec during the day and benadryl at night.  States it is not helping her much.

## 2015-09-30 ENCOUNTER — Inpatient Hospital Stay: Payer: Medicare Other

## 2015-09-30 VITALS — BP 119/63 | HR 66 | Temp 97.8°F

## 2015-09-30 DIAGNOSIS — C541 Malignant neoplasm of endometrium: Secondary | ICD-10-CM | POA: Diagnosis not present

## 2015-09-30 DIAGNOSIS — C55 Malignant neoplasm of uterus, part unspecified: Secondary | ICD-10-CM

## 2015-09-30 MED ORDER — SODIUM CHLORIDE 0.9 % IV SOLN
Freq: Once | INTRAVENOUS | Status: AC
  Administered 2015-09-30: 14:00:00 via INTRAVENOUS
  Filled 2015-09-30: qty 4

## 2015-09-30 MED ORDER — HEPARIN SOD (PORK) LOCK FLUSH 100 UNIT/ML IV SOLN
500.0000 [IU] | Freq: Once | INTRAVENOUS | Status: AC | PRN
  Administered 2015-09-30: 500 [IU]
  Filled 2015-09-30: qty 5

## 2015-09-30 MED ORDER — SODIUM CHLORIDE 0.9 % IV SOLN
Freq: Once | INTRAVENOUS | Status: AC
  Administered 2015-09-30: 14:00:00 via INTRAVENOUS
  Filled 2015-09-30: qty 1000

## 2015-09-30 MED ORDER — SODIUM CHLORIDE 0.9 % IJ SOLN
10.0000 mL | INTRAMUSCULAR | Status: DC | PRN
  Administered 2015-09-30: 10 mL
  Filled 2015-09-30: qty 10

## 2015-10-03 ENCOUNTER — Inpatient Hospital Stay: Payer: Medicare Other

## 2015-10-03 ENCOUNTER — Other Ambulatory Visit: Payer: Self-pay | Admitting: Family Medicine

## 2015-10-03 ENCOUNTER — Telehealth: Payer: Self-pay | Admitting: *Deleted

## 2015-10-03 DIAGNOSIS — C541 Malignant neoplasm of endometrium: Secondary | ICD-10-CM | POA: Diagnosis not present

## 2015-10-03 DIAGNOSIS — C55 Malignant neoplasm of uterus, part unspecified: Secondary | ICD-10-CM

## 2015-10-03 MED ORDER — SODIUM CHLORIDE 0.9 % IJ SOLN
10.0000 mL | INTRAMUSCULAR | Status: DC | PRN
  Filled 2015-10-03: qty 10

## 2015-10-03 MED ORDER — SODIUM CHLORIDE 0.9 % IV SOLN
Freq: Once | INTRAVENOUS | Status: AC
  Administered 2015-10-03: 10:00:00 via INTRAVENOUS
  Filled 2015-10-03: qty 1000

## 2015-10-03 MED ORDER — SODIUM CHLORIDE 0.9 % IV SOLN
Freq: Once | INTRAVENOUS | Status: AC
  Administered 2015-10-03: 10:00:00 via INTRAVENOUS
  Filled 2015-10-03: qty 4

## 2015-10-03 MED ORDER — PREDNISONE 10 MG PO TABS: 30.0000 mg | ORAL_TABLET | Freq: Every day | ORAL | Status: DC

## 2015-10-03 MED ORDER — HEPARIN SOD (PORK) LOCK FLUSH 100 UNIT/ML IV SOLN
500.0000 [IU] | Freq: Once | INTRAVENOUS | Status: AC | PRN
  Administered 2015-10-03: 500 [IU]
  Filled 2015-10-03: qty 5

## 2015-10-03 MED ORDER — SODIUM CHLORIDE 0.9 % IV SOLN
INTRAVENOUS | Status: DC
  Administered 2015-10-03: 10:00:00 via INTRAVENOUS
  Filled 2015-10-03: qty 1000

## 2015-10-03 MED ORDER — ACETAMINOPHEN 325 MG PO TABS
650.0000 mg | ORAL_TABLET | Freq: Once | ORAL | Status: AC
  Administered 2015-10-03: 650 mg via ORAL
  Filled 2015-10-03: qty 2

## 2015-10-03 NOTE — Telephone Encounter (Signed)
Has had 5 watery stools this morning. Had IVF yesterday. Is on Opdivo therapy. Per L Herring, pt agrees to come in for IVF and steroids. She stated she will be here in 1/2 hour

## 2015-10-07 ENCOUNTER — Encounter: Payer: Self-pay | Admitting: Oncology

## 2015-10-07 ENCOUNTER — Inpatient Hospital Stay: Payer: Medicare Other | Attending: Oncology | Admitting: Oncology

## 2015-10-07 ENCOUNTER — Telehealth: Payer: Self-pay | Admitting: *Deleted

## 2015-10-07 ENCOUNTER — Inpatient Hospital Stay: Payer: Medicare Other

## 2015-10-07 VITALS — BP 147/69 | HR 66 | Temp 97.7°F | Wt 126.1 lb

## 2015-10-07 DIAGNOSIS — R944 Abnormal results of kidney function studies: Secondary | ICD-10-CM | POA: Diagnosis not present

## 2015-10-07 DIAGNOSIS — Z7952 Long term (current) use of systemic steroids: Secondary | ICD-10-CM | POA: Diagnosis not present

## 2015-10-07 DIAGNOSIS — R197 Diarrhea, unspecified: Secondary | ICD-10-CM | POA: Diagnosis not present

## 2015-10-07 DIAGNOSIS — R21 Rash and other nonspecific skin eruption: Secondary | ICD-10-CM | POA: Diagnosis not present

## 2015-10-07 DIAGNOSIS — K5900 Constipation, unspecified: Secondary | ICD-10-CM | POA: Insufficient documentation

## 2015-10-07 DIAGNOSIS — R590 Localized enlarged lymph nodes: Secondary | ICD-10-CM | POA: Insufficient documentation

## 2015-10-07 DIAGNOSIS — C55 Malignant neoplasm of uterus, part unspecified: Secondary | ICD-10-CM

## 2015-10-07 DIAGNOSIS — Z79899 Other long term (current) drug therapy: Secondary | ICD-10-CM | POA: Diagnosis not present

## 2015-10-07 DIAGNOSIS — C541 Malignant neoplasm of endometrium: Secondary | ICD-10-CM

## 2015-10-07 DIAGNOSIS — F329 Major depressive disorder, single episode, unspecified: Secondary | ICD-10-CM | POA: Diagnosis not present

## 2015-10-07 DIAGNOSIS — Z9221 Personal history of antineoplastic chemotherapy: Secondary | ICD-10-CM | POA: Insufficient documentation

## 2015-10-07 DIAGNOSIS — C779 Secondary and unspecified malignant neoplasm of lymph node, unspecified: Secondary | ICD-10-CM | POA: Diagnosis not present

## 2015-10-07 DIAGNOSIS — R5383 Other fatigue: Secondary | ICD-10-CM | POA: Diagnosis not present

## 2015-10-07 DIAGNOSIS — R531 Weakness: Secondary | ICD-10-CM | POA: Insufficient documentation

## 2015-10-07 DIAGNOSIS — R109 Unspecified abdominal pain: Secondary | ICD-10-CM | POA: Insufficient documentation

## 2015-10-07 LAB — CBC WITH DIFFERENTIAL/PLATELET
Basophils Absolute: 0.1 10*3/uL (ref 0–0.1)
Basophils Relative: 1 %
Eosinophils Absolute: 0 10*3/uL (ref 0–0.7)
Eosinophils Relative: 0 %
HEMATOCRIT: 26.7 % — AB (ref 35.0–47.0)
HEMOGLOBIN: 8.8 g/dL — AB (ref 12.0–16.0)
LYMPHS ABS: 0.3 10*3/uL — AB (ref 1.0–3.6)
LYMPHS PCT: 4 %
MCH: 28.9 pg (ref 26.0–34.0)
MCHC: 32.9 g/dL (ref 32.0–36.0)
MCV: 87.8 fL (ref 80.0–100.0)
MONO ABS: 0.3 10*3/uL (ref 0.2–0.9)
MONOS PCT: 4 %
NEUTROS ABS: 6.9 10*3/uL — AB (ref 1.4–6.5)
NEUTROS PCT: 91 %
Platelets: 301 10*3/uL (ref 150–440)
RBC: 3.04 MIL/uL — ABNORMAL LOW (ref 3.80–5.20)
RDW: 13.8 % (ref 11.5–14.5)
WBC: 7.7 10*3/uL (ref 3.6–11.0)

## 2015-10-07 LAB — COMPREHENSIVE METABOLIC PANEL
ALK PHOS: 105 U/L (ref 38–126)
ALT: 15 U/L (ref 14–54)
ANION GAP: 6 (ref 5–15)
AST: 27 U/L (ref 15–41)
Albumin: 3.3 g/dL — ABNORMAL LOW (ref 3.5–5.0)
BILIRUBIN TOTAL: 0.4 mg/dL (ref 0.3–1.2)
BUN: 26 mg/dL — AB (ref 6–20)
CALCIUM: 8.2 mg/dL — AB (ref 8.9–10.3)
CO2: 22 mmol/L (ref 22–32)
Chloride: 105 mmol/L (ref 101–111)
Creatinine, Ser: 1.59 mg/dL — ABNORMAL HIGH (ref 0.44–1.00)
GFR calc Af Amer: 38 mL/min — ABNORMAL LOW (ref >60)
GFR, EST NON AFRICAN AMERICAN: 33 mL/min — AB (ref >60)
Glucose, Bld: 123 mg/dL — ABNORMAL HIGH (ref 65–99)
POTASSIUM: 4 mmol/L (ref 3.5–5.1)
Sodium: 133 mmol/L — ABNORMAL LOW (ref 135–145)
TOTAL PROTEIN: 6.5 g/dL (ref 6.5–8.1)

## 2015-10-07 LAB — MAGNESIUM: MAGNESIUM: 1.9 mg/dL (ref 1.7–2.4)

## 2015-10-07 MED ORDER — SODIUM CHLORIDE 0.9 % IV SOLN
Freq: Once | INTRAVENOUS | Status: AC
  Administered 2015-10-07: 14:00:00 via INTRAVENOUS
  Filled 2015-10-07: qty 1000

## 2015-10-07 MED ORDER — HEPARIN SOD (PORK) LOCK FLUSH 100 UNIT/ML IV SOLN
500.0000 [IU] | Freq: Once | INTRAVENOUS | Status: AC
  Administered 2015-10-07: 500 [IU] via INTRAVENOUS
  Filled 2015-10-07: qty 5

## 2015-10-07 MED ORDER — SODIUM CHLORIDE 0.9 % IV SOLN
Freq: Once | INTRAVENOUS | Status: AC
  Administered 2015-10-07: 14:00:00 via INTRAVENOUS
  Filled 2015-10-07: qty 0.8

## 2015-10-07 MED ORDER — ALPRAZOLAM 0.25 MG PO TABS: 0.2500 mg | ORAL_TABLET | Freq: Two times a day (BID) | ORAL | Status: DC | PRN

## 2015-10-07 MED ORDER — SODIUM CHLORIDE 0.9 % IJ SOLN
10.0000 mL | INTRAMUSCULAR | Status: DC | PRN
  Administered 2015-10-07: 10 mL via INTRAVENOUS
  Filled 2015-10-07: qty 10

## 2015-10-07 NOTE — Progress Notes (Signed)
[place] @ [facility] [phone]  Fax:(336) [phone]     [name] Graham OB: 1951-09-04  MR#: [id number]  FYB#:[id number]  [name], MD as PCP - General (Family Medicine) Seeplaputhur [name] Haines, MD (General Surgery)  CHIEF COMPLAINT:  Chief Complaint  Patient presents with  . OTHER    Chief Complaint/Diagnosis:   The patient is a 63 year old with metastatic endometrial cancer who is seen for assessment during ongoing palliative radiation therapy.  04/2011   papillary serous adenocarcinoma of the endometrium, stage IIIb (nodal metastasis)              TAHBSO and full staging,               GOG protocl using XRT and chemotherapy, significant XRT side-effects,               continued on chemotherapy off protocol, 04/2013   recurrence in the nodal areas,              DDP and docetaxel x 6 completed in December,   started on chemotherapy with Adriamycin(October, 2015) poor tolerance to letrozole  and affinator Estrogen and progesterone receptor negative tumor         hER-2/neu recetor negative. November 07, 2014 progressive disease by  tumor marker criteria and clinical examination November 12, 2014 After 6 cycles of chemotherapy because of increasing side effect chemotherapy was discontinued.  (March, 2016) Patient was started on cis-platinum and topotecan. May, 2016 Cis-platinum and topotecan  and was discontinued because of significant side effect and possibility of progressive disease.  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  Patient is here for ongoing evaluation and treatment consideration Patient was seen on Friday with a history of diarrhea.  Was started on prednisone which is being tapered off Diarrhea improved and patient got constipated MiraLAX started some abdominal discomfort and diarrhea this morning Here for further follow-up feeling week tired slightly anxious Rash is improved  Feeling somewhat weak and tired  No chills or fever REVIEW OF SYSTEMS:    general status: Patient is feeling weak and tired.  No change in a performance status.  No chills.  No fever.  Feeling extremely weak and tired HEENT: Alopecia.  No evidence of stomatitis increasing discomfort on the left side of the neck Lungs: No cough or shortness of breath Cardiac: No chest pain or paroxysmal nocturnal dyspnea GI: Diarrhea and abdominal pain is described about On stay patient followed by black stool. Skin: Macular rash grade 2 Lower extremity no swelling Neurological system: No tingling.  No numbness.  No other focal signs Musculoskeletal system no bony pains poor appetite has lost approximately 5 pounds in last month After last month evaluation patient's condition is somewhat declining last few days with diarrhea abdominal discomfort Now has constipation patient again became  very depressed Abdominal pain is now controlled but taking pain medication around-the-clock  PAST MEDICAL HISTORY: Past Medical History  Diagnosis Date  . Cancer ([place]) 2012  . GI problem 2013  . Cancer of uterus ([place]) 04/18/2015    PAST SURGICAL HISTORY: Past Surgical History  Procedure Laterality Date  . Abdominal hysterectomy      FAMILY HISTORY No significant family history.      ADVANCED DIRECTIVES: Patient does have advance directive   HEALTH MAINTENANCE: Social History  Substance Use Topics  . Smoking status: Never Smoker   . Smokeless tobacco:  Never Used  . Alcohol Use: Yes      Allergies  Allergen Reactions  . Penicillins Rash    Current Outpatient Prescriptions  Medication Sig Dispense Refill  . ALPRAZolam (XANAX) 0.25 MG tablet Take 1 tablet (0.25 mg total) by mouth 2 (two) times daily as needed for anxiety. 30 tablet 3  . bismuth subsalicylate (PEPTO BISMOL) 262 MG chewable tablet Chew 524 mg by mouth as needed for indigestion.     . cholestyramine (QUESTRAN) 4 G packet Take 1 packet by mouth 3 (three) times daily as needed.     . citalopram (CELEXA) 40 MG tablet Take 1 tablet (40 mg total) by mouth daily. 30 tablet 3  . fentaNYL (DURAGESIC - DOSED MCG/HR) 12 MCG/HR Place 1 patch (12.5 mcg total) onto the skin every 3 (three) days. Use with 561mg patch for total dose of 37.563m. 10 patch 0  . fentaNYL (DURAGESIC - DOSED MCG/HR) 25 MCG/HR patch Place 1 patch (25 mcg total) onto the skin every 3 (three) days. Use with 12.61m30mpatch for total dose 37.61mc39m10 patch 0  . fexofenadine (ALLEGRA) 180 MG tablet Take 180 mg by mouth as needed.     . fluticasone (FLONASE) 50 MCG/ACT nasal spray Place 1-2 sprays into the nose daily as needed.    . HYMarland KitchenROcodone-acetaminophen (NORCO/VICODIN) 5-325 MG tablet Take 1 tablet by mouth every 6 (six) hours as needed for moderate pain. 60 tablet 0  . lidocaine-prilocaine (EMLA) cream Apply to affected area once 30 g 3  . megestrol (MEGACE) 400 MG/10ML suspension Take 10 mLs (400 mg total) by mouth daily. 300 mL 6  . metoCLOPramide (REGLAN) 10 MG tablet Take 10 mg by mouth 4 (four) times daily.    . omMarland Kitchenprazole (PRILOSEC) 20 MG capsule     . omeprazole (PRILOSEC) 20 MG capsule TAKE 1 CAPSULE BY MOUTH TWICE DAILY BEFORE MEALS 60 capsule 0  . ondansetron (ZOFRAN) 4 MG tablet Take 1 tablet (4 mg total) by mouth every 6 (six) hours as needed. 60 tablet 3  . polyethylene glycol powder (GLYCOLAX/MIRALAX) powder Take by mouth.    . potassium chloride (K-DUR) 10 MEQ tablet Take 1 tablet (10 mEq total) by  mouth daily. 30 tablet 3  . predniSONE (DELTASONE) 10 MG tablet Take 3 tablets (30 mg total) by mouth daily with breakfast. Taper by 10mg38mry other day 12 tablet 0  . ranitidine (ZANTAC) 150 MG tablet Take 1 tablet by mouth 2 (two) times daily.    . sertraline (ZOLOFT) 25 MG tablet Take 25 mg by mouth daily.    . Triamcinolone Acetonide (TRIAMCINOLONE 0.1 % CREAM : EUCERIN) CREA Apply 1 application topically 2 (two) times daily. 1 each 0   No current facility-administered medications for this visit.   Facility-Administered Medications Ordered in Other Visits  Medication Dose Route Frequency Provider Last Rate Last Dose  . 0.9 %  sodium chloride  infusion   Intravenous Once Forest Gleason, MD   Stopped at 10/07/15 1510  . dexamethasone (DECADRON) 8 mg in sodium chloride 0.9 % 50 mL IVPB   Intravenous Once Forest Gleason, MD      . heparin lock flush 100 unit/mL  500 Units Intravenous Once Forest Gleason, MD      . sodium chloride 0.9 % injection 10 mL  10 mL Intracatheter PRN Forest Gleason, MD   10 mL at 05/06/15 1028  . sodium chloride 0.9 % injection 10 mL  10 mL Intravenous PRN Forest Gleason, MD      . sodium chloride 0.9 % injection 10 mL  10 mL Intravenous PRN Forest Gleason, MD   10 mL at 10/07/15 1329    OBJECTIVE:  Filed Vitals:   10/07/15 1345  BP: 147/69  Pulse: 66  Temp: 97.7 F (36.5 C)     Body mass index is 22.34 kg/(m^2).    ECOG FS:1 - Symptomatic but completely ambulatory  PHYSICAL EXAM: Gen. status: Alert and oriented lady not any acute distress Patient is very emotional with frequent crying spells Eyes: Pale-looking sclera and conjunctiva HEENT: Left supraclavicular  Mass is not palpable at present time Cardiac exam revealed the PMI to be normally situated and sized. The rhythm was regular and no extrasystoles were noted during several minutes of auscultation. The first and second heart sounds were normal and physiologic splitting of the second heart sound was noted. There  were no murmurs, rubs, clicks, or gallops.. Examination of the chest was unremarkable. There were no bony deformities, no asymmetry, and no other abnormalities.. Neurologically, the patient was awake, alert, and oriented to person, place and time. There were no obvious focal neurologic abnormalities. Examination of the skin revealed no evidence of significant rashes, suspicious appearing nevi or other concerning lesions. Abdomen: Slight tenderness no palpable masses bowel sounds are present Lower extremity no swelling Skin: Rash is improved Psychiatric system: Patient is   depressed LAB RESULTS:  Infusion on 10/07/2015  Component Date Value Ref Range Status  . WBC 10/07/2015 7.7  3.6 - 11.0 K/uL Final   A-LINE DRAW  . RBC 10/07/2015 3.04* 3.80 - 5.20 MIL/uL Final  . Hemoglobin 10/07/2015 8.8* 12.0 - 16.0 g/dL Final  . HCT 10/07/2015 26.7* 35.0 - 47.0 % Final  . MCV 10/07/2015 87.8  80.0 - 100.0 fL Final  . MCH 10/07/2015 28.9  26.0 - 34.0 pg Final  . MCHC 10/07/2015 32.9  32.0 - 36.0 g/dL Final  . RDW 10/07/2015 13.8  11.5 - 14.5 % Final  . Platelets 10/07/2015 301  150 - 440 K/uL Final  . Neutrophils Relative % 10/07/2015 91   Final  . Neutro Abs 10/07/2015 6.9* 1.4 - 6.5 K/uL Final  . Lymphocytes Relative 10/07/2015 4   Final  . Lymphs Abs 10/07/2015 0.3* 1.0 - 3.6 K/uL Final  . Monocytes Relative 10/07/2015 4   Final  . Monocytes Absolute 10/07/2015 0.3  0.2 - 0.9 K/uL Final  . Eosinophils Relative 10/07/2015 0   Final  . Eosinophils Absolute 10/07/2015 0.0  0 - 0.7 K/uL Final  . Basophils Relative 10/07/2015 1   Final  . Basophils Absolute 10/07/2015 0.1  0 - 0.1 K/uL Final  . Sodium 10/07/2015 133* 135 - 145 mmol/L Final  . Potassium 10/07/2015 4.0  3.5 - 5.1 mmol/L Final  . Chloride 10/07/2015 105  101 - 111 mmol/L Final  . CO2 10/07/2015 22  22 - 32 mmol/L Final  .  Glucose, Bld 10/07/2015 123* 65 - 99 mg/dL Final  . BUN 10/07/2015 26* 6 - 20 mg/dL Final  . Creatinine, Ser  10/07/2015 1.59* 0.44 - 1.00 mg/dL Final  . Calcium 10/07/2015 8.2* 8.9 - 10.3 mg/dL Final  . Total Protein 10/07/2015 6.5  6.5 - 8.1 g/dL Final  . Albumin 10/07/2015 3.3* 3.5 - 5.0 g/dL Final  . AST 10/07/2015 27  15 - 41 U/L Final  . ALT 10/07/2015 15  14 - 54 U/L Final  . Alkaline Phosphatase 10/07/2015 105  38 - 126 U/L Final  . Total Bilirubin 10/07/2015 0.4  0.3 - 1.2 mg/dL Final  . GFR calc non Af Amer 10/07/2015 33* >60 mL/min Final  . GFR calc Af Amer 10/07/2015 38* >60 mL/min Final   Comment: (NOTE) The eGFR has been calculated using the CKD EPI equation. This calculation has not been validated in all clinical situations. eGFR's persistently <60 mL/min signify possible Chronic Kidney Disease.   . Anion gap 10/07/2015 6  5 - 15 Final  . Magnesium 10/07/2015 1.9  1.7 - 2.4 mg/dL Final    Lab Results  Component Value Date   CA125 18.8 07/23/2014    ASSESSMENT: Carcinoma of endometrium  Stage IV disease.  Patient has failed  multiple treatment Significant response to  nivolulamab .  PET scan has been reviewed independently and so stable disease with some improvement in retroperitoneal lymph node Some increase in some other lymphadenopathy with metabolic activity has increased Rash most likely due to medication possibility of that being NIVOLULAMAB cannot be ruled out At this point in time will start patient on Eucerin with aristicort  t cream If rash does not get better then possibility of  discontinuing or holding NIVOLULAMAB been giving short course of prednisone may be considered. MEDICAL DECISION MAKING:  Patient has diarrhea off and on on prednisone will hold off NIVOLULAMAB The patient gets all prednisone and no diarrhea. Review of labs suggest patient is dehydrated serum creatinine is high Will give patient IV fluid.  Hold NIVOLULAMAB.  Reevaluate patient next week. And was somewhat disappointed as he has noticed some growth in the left supraclavicular lymph  node Total duration of visit was63mnutes.  50% or more time was spent in counseling patient and family regarding prognosis and options of treatment and available resources Cancer of uterus   Staging form: Corpus Uteri - Carcinoma, AJCC 7th Edition     Clinical: Stage IVB (T3b, N2, M1) - Signed by JForest Gleason MD on 04/18/2015   JForest Gleason MD   10/07/2015 2:11 PM

## 2015-10-07 NOTE — Telephone Encounter (Signed)
Patient requesting refill on Alprazolam.  States she usually gets #30 but is requesting #60 due to her taking 2 tablets a day.

## 2015-10-07 NOTE — Telephone Encounter (Signed)
Called patient and left message that prescription for alprazolam was faxed to pharmacy.

## 2015-10-07 NOTE — Addendum Note (Signed)
Addended by: Telford Nab on: 10/07/2015 02:29 PM   Modules accepted: Orders

## 2015-10-07 NOTE — Progress Notes (Signed)
Patient had diarrhea over the weekend and then did not go to the bathroom at all.  Took Miralax yesterday and when she got up she had diarrhea again.  States she has had 3 BM's today.  Further states she is having sharp, intermittent pain in her abdomen.

## 2015-10-07 NOTE — Telephone Encounter (Signed)
rx for alprazolam faxed to pharmacy 

## 2015-10-08 ENCOUNTER — Other Ambulatory Visit: Payer: Self-pay | Admitting: Oncology

## 2015-10-14 ENCOUNTER — Inpatient Hospital Stay (HOSPITAL_BASED_OUTPATIENT_CLINIC_OR_DEPARTMENT_OTHER): Payer: Medicare Other | Admitting: Oncology

## 2015-10-14 ENCOUNTER — Inpatient Hospital Stay: Payer: Medicare Other

## 2015-10-14 ENCOUNTER — Encounter: Payer: Self-pay | Admitting: Oncology

## 2015-10-14 VITALS — BP 127/72 | HR 74 | Temp 97.9°F | Wt 124.3 lb

## 2015-10-14 DIAGNOSIS — R590 Localized enlarged lymph nodes: Secondary | ICD-10-CM | POA: Diagnosis not present

## 2015-10-14 DIAGNOSIS — R531 Weakness: Secondary | ICD-10-CM

## 2015-10-14 DIAGNOSIS — Z79899 Other long term (current) drug therapy: Secondary | ICD-10-CM

## 2015-10-14 DIAGNOSIS — C55 Malignant neoplasm of uterus, part unspecified: Secondary | ICD-10-CM

## 2015-10-14 DIAGNOSIS — C779 Secondary and unspecified malignant neoplasm of lymph node, unspecified: Secondary | ICD-10-CM | POA: Diagnosis not present

## 2015-10-14 DIAGNOSIS — R21 Rash and other nonspecific skin eruption: Secondary | ICD-10-CM

## 2015-10-14 DIAGNOSIS — C541 Malignant neoplasm of endometrium: Secondary | ICD-10-CM

## 2015-10-14 DIAGNOSIS — Z7952 Long term (current) use of systemic steroids: Secondary | ICD-10-CM

## 2015-10-14 DIAGNOSIS — R5383 Other fatigue: Secondary | ICD-10-CM

## 2015-10-14 DIAGNOSIS — F329 Major depressive disorder, single episode, unspecified: Secondary | ICD-10-CM

## 2015-10-14 DIAGNOSIS — R197 Diarrhea, unspecified: Secondary | ICD-10-CM

## 2015-10-14 DIAGNOSIS — R109 Unspecified abdominal pain: Secondary | ICD-10-CM

## 2015-10-14 LAB — BASIC METABOLIC PANEL
Anion gap: 7 (ref 5–15)
BUN: 33 mg/dL — AB (ref 6–20)
CHLORIDE: 103 mmol/L (ref 101–111)
CO2: 21 mmol/L — AB (ref 22–32)
CREATININE: 1.69 mg/dL — AB (ref 0.44–1.00)
Calcium: 8.1 mg/dL — ABNORMAL LOW (ref 8.9–10.3)
GFR calc Af Amer: 35 mL/min — ABNORMAL LOW (ref >60)
GFR calc non Af Amer: 30 mL/min — ABNORMAL LOW (ref >60)
GLUCOSE: 126 mg/dL — AB (ref 65–99)
Potassium: 3.8 mmol/L (ref 3.5–5.1)
SODIUM: 131 mmol/L — AB (ref 135–145)

## 2015-10-14 LAB — CBC WITH DIFFERENTIAL/PLATELET
Basophils Absolute: 0 10*3/uL (ref 0–0.1)
Basophils Relative: 1 %
EOS ABS: 0.2 10*3/uL (ref 0–0.7)
Eosinophils Relative: 3 %
HCT: 26.1 % — ABNORMAL LOW (ref 35.0–47.0)
HEMOGLOBIN: 8.6 g/dL — AB (ref 12.0–16.0)
LYMPHS ABS: 0.5 10*3/uL — AB (ref 1.0–3.6)
Lymphocytes Relative: 8 %
MCH: 28.8 pg (ref 26.0–34.0)
MCHC: 33.1 g/dL (ref 32.0–36.0)
MCV: 87.1 fL (ref 80.0–100.0)
MONOS PCT: 8 %
Monocytes Absolute: 0.5 10*3/uL (ref 0.2–0.9)
NEUTROS PCT: 80 %
Neutro Abs: 5.4 10*3/uL (ref 1.4–6.5)
Platelets: 223 10*3/uL (ref 150–440)
RBC: 3 MIL/uL — ABNORMAL LOW (ref 3.80–5.20)
RDW: 14 % (ref 11.5–14.5)
WBC: 6.7 10*3/uL (ref 3.6–11.0)

## 2015-10-14 MED ORDER — FENTANYL 12 MCG/HR TD PT72: 12.5000 ug | MEDICATED_PATCH | TRANSDERMAL | Status: DC

## 2015-10-14 MED ORDER — SODIUM CHLORIDE 0.9 % IV SOLN
Freq: Once | INTRAVENOUS | Status: AC
  Administered 2015-10-14: 15:00:00 via INTRAVENOUS
  Filled 2015-10-14: qty 1000

## 2015-10-14 MED ORDER — SODIUM CHLORIDE 0.9 % IJ SOLN
10.0000 mL | Freq: Once | INTRAMUSCULAR | Status: AC
  Administered 2015-10-14: 10 mL via INTRAVENOUS
  Filled 2015-10-14: qty 10

## 2015-10-14 MED ORDER — SODIUM CHLORIDE 0.9 % IV SOLN
240.0000 mg | Freq: Once | INTRAVENOUS | Status: AC
  Administered 2015-10-14: 240 mg via INTRAVENOUS
  Filled 2015-10-14: qty 24

## 2015-10-14 MED ORDER — HYDROCODONE-ACETAMINOPHEN 5-325 MG PO TABS: 1.0000 | ORAL_TABLET | Freq: Four times a day (QID) | ORAL | Status: DC | PRN

## 2015-10-14 MED ORDER — HEPARIN SOD (PORK) LOCK FLUSH 100 UNIT/ML IV SOLN
500.0000 [IU] | Freq: Once | INTRAVENOUS | Status: AC
  Administered 2015-10-14: 500 [IU] via INTRAVENOUS
  Filled 2015-10-14: qty 5

## 2015-10-14 NOTE — Progress Notes (Signed)
[place] @ [facility] [phone]  [phone]     Tionna Gigante OB: 1951/02/05  MR#: [id number]  FWY#:[id number]  [name], MD as PCP - General (Family Medicine) [name], MD (General Surgery)  CHIEF COMPLAINT:  Chief Complaint  Patient presents with  . OTHER    Chief Complaint/Diagnosis:   The patient is a 64 year old with metastatic endometrial cancer who is seen for assessment during ongoing palliative radiation therapy.  04/2011   papillary serous adenocarcinoma of the endometrium, stage IIIb (nodal metastasis)              TAHBSO and full staging,               GOG protocl using XRT and chemotherapy, significant XRT side-effects,               continued on chemotherapy off protocol, 04/2013   recurrence in the nodal areas,              DDP and docetaxel x 6 completed in December,   started on chemotherapy with Adriamycin(October, 2015) poor tolerance to letrozole  and affinator Estrogen and progesterone receptor negative tumor         hER-2/neu recetor negative. November 07, 2014 progressive disease by  tumor marker criteria and clinical examination November 12, 2014 After 6 cycles of chemotherapy because of increasing side effect chemotherapy was discontinued.  (March, 2016) Patient was started on cis-platinum and topotecan. May, 2016 Cis-platinum and topotecan  and was discontinued because of significant side effect and possibility of progressive disease.  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  Patient is here for ongoing evaluation and treatment consideration Patient was seen on Friday with a history of diarrhea.  Was started on prednisone which is being tapered off Diarrhea improved and patient got constipated MiraLAX started some abdominal discomfort and diarrhea this morning Here for further follow-up feeling week tired slightly anxious Rash is improved  Feeling somewhat weak and tired  No chills or fever Abdominal pain is improved. No diarrhea. Using fentanyl patch 37 g and Vicodin for breakthrough pain Patient has lost 3 pounds of weight REVIEW OF SYSTEMS:    general status: Patient is feeling weak and tired.  No change in a performance status.  No chills.  No fever.  Feeling extremely weak and tired HEENT: Alopecia.  No evidence of stomatitis increasing discomfort on the left side of the neck Lungs: No cough or shortness of breath Cardiac: No chest pain or paroxysmal nocturnal dyspnea GI: diarrhea and constipation is improved.  Abdominal pain is improved. On stay patient followed by black stool. Skin: Macular rash grade 2 Lower extremity no swelling Neurological system: No tingling.  No numbness.  No other focal signs Musculoskeletal  system no bony pains poor appetite has lost approximately 5 pounds in last month clock  PAST MEDICAL HISTORY: Past Medical History  Diagnosis Date  . Cancer ([place]) 2012  . GI problem 2013  . Cancer of uterus ([place]) 04/18/2015    PAST SURGICAL HISTORY: Past Surgical History  Procedure Laterality Date  . Abdominal hysterectomy      FAMILY HISTORY No significant family history.      ADVANCED DIRECTIVES: Patient does have advance directive   HEALTH MAINTENANCE: Social History  Substance Use Topics  . Smoking status: Never Smoker   . Smokeless tobacco: Never Used  . Alcohol Use: Yes      Allergies    Allergen Reactions  . Penicillins Rash    Current Outpatient Prescriptions  Medication Sig Dispense Refill  . ALPRAZolam (XANAX) 0.25 MG tablet Take 1 tablet (0.25 mg total) by mouth 2 (two) times daily as needed for anxiety. 60 tablet 3  . bismuth subsalicylate (PEPTO BISMOL) 262 MG chewable tablet Chew 524 mg by mouth as needed for indigestion.     . cholestyramine (QUESTRAN) 4 G packet Take 1 packet by mouth 3 (three) times daily as needed.     . citalopram (CELEXA) 40 MG tablet Take 1 tablet (40 mg total) by mouth daily. 30 tablet 3  . [START ON 10/23/2015] fentaNYL (DURAGESIC - DOSED MCG/HR) 12 MCG/HR Place 1 patch (12.5 mcg total) onto the skin every 3 (three) days. Use with 14mg patch for total dose of 37.514m. 10 patch 0  . fentaNYL (DURAGESIC - DOSED MCG/HR) 25 MCG/HR patch Place 1 patch (25 mcg total) onto the skin every 3 (three) days. Use with 12.64m21mpatch for total dose 37.64mc66m10 patch 0  . fexofenadine (ALLEGRA) 180 MG tablet Take 180 mg by mouth as needed.     . fluticasone (FLONASE) 50 MCG/ACT nasal spray Place 1-2 sprays into the nose daily as needed.    . HYMarland KitchenROcodone-acetaminophen (NORCO/VICODIN) 5-325 MG tablet Take 1 tablet by mouth every 6 (six) hours as needed for moderate pain. 60 tablet 0  . lidocaine-prilocaine (EMLA) cream Apply to affected area once 30 g 3  . megestrol (MEGACE) 400 MG/10ML suspension Take 10 mLs (400 mg total) by mouth daily. 300 mL 6  . metoCLOPramide (REGLAN) 10 MG tablet Take 10 mg by mouth 4 (four) times daily.    . omMarland Kitchenprazole (PRILOSEC) 20 MG capsule TAKE 1 CAPSULE BY MOUTH TWICE DAILY BEFORE MEALS 60 capsule 0  . ondansetron (ZOFRAN) 4 MG tablet Take 1 tablet (4 mg total) by mouth every 6 (six) hours as needed. 60 tablet 3  . polyethylene glycol powder (GLYCOLAX/MIRALAX) powder Take by mouth.    . potassium chloride (K-DUR) 10 MEQ tablet Take 1 tablet (10 mEq total) by mouth daily. 30 tablet 3  . predniSONE (DELTASONE) 10 MG tablet Take 3  tablets (30 mg total) by mouth daily with breakfast. Taper by 10mg46mry other day 12 tablet 0  . ranitidine (ZANTAC) 150 MG tablet Take 1 tablet by mouth 2 (two) times daily.    . sertraline (ZOLOFT) 25 MG tablet Take 25 mg by mouth daily.    . Triamcinolone Acetonide (TRIAMCINOLONE 0.1 % CREAM : EUCERIN) CREA Apply 1 application topically 2 (two) times daily. 1 each 0   No current facility-administered medications for this visit.   Facility-Administered Medications Ordered in Other Visits  Medication Dose Route Frequency Provider Last Rate Last Dose  . heparin lock flush 100 unit/mL  500 Units  Intravenous Once Forest Gleason, MD      . sodium chloride 0.9 % injection 10 mL  10 mL Intracatheter PRN Forest Gleason, MD   10 mL at 05/06/15 1028  . sodium chloride 0.9 % injection 10 mL  10 mL Intravenous PRN Forest Gleason, MD        OBJECTIVE:  Filed Vitals:   10/14/15 1355  BP: 127/72  Pulse: 74  Temp: 97.9 F (36.6 C)     Body mass index is 22.03 kg/(m^2).    ECOG FS:1 - Symptomatic but completely ambulatory  PHYSICAL EXAM: Gen. status: Alert and oriented lady not any acute distress Patient is very emotional with frequent crying spells Eyes: Pale-looking sclera and conjunctiva HEENT: Left supraclavicular  Mass is not palpable at present time Cardiac exam revealed the PMI to be normally situated and sized. The rhythm was regular and no extrasystoles were noted during several minutes of auscultation. The first and second heart sounds were normal and physiologic splitting of the second heart sound was noted. There were no murmurs, rubs, clicks, or gallops.. Examination of the chest was unremarkable. There were no bony deformities, no asymmetry, and no other abnormalities.. Neurologically, the patient was awake, alert, and oriented to person, place and time. There were no obvious focal neurologic abnormalities. Examination of the skin revealed no evidence of significant rashes, suspicious  appearing nevi or other concerning lesions. Abdomen: Slight tenderness no palpable masses bowel sounds are present Lower extremity no swelling Skin: Rash is improved Psychiatric system: Patient is   depressed LAB RESULTS:  Infusion on 10/14/2015  Component Date Value Ref Range Status  . WBC 10/14/2015 6.7  3.6 - 11.0 K/uL Final   A-LINE DRAW  . RBC 10/14/2015 3.00* 3.80 - 5.20 MIL/uL Final  . Hemoglobin 10/14/2015 8.6* 12.0 - 16.0 g/dL Final  . HCT 10/14/2015 26.1* 35.0 - 47.0 % Final  . MCV 10/14/2015 87.1  80.0 - 100.0 fL Final  . MCH 10/14/2015 28.8  26.0 - 34.0 pg Final  . MCHC 10/14/2015 33.1  32.0 - 36.0 g/dL Final  . RDW 10/14/2015 14.0  11.5 - 14.5 % Final  . Platelets 10/14/2015 223  150 - 440 K/uL Final  . Neutrophils Relative % 10/14/2015 80   Final  . Neutro Abs 10/14/2015 5.4  1.4 - 6.5 K/uL Final  . Lymphocytes Relative 10/14/2015 8   Final  . Lymphs Abs 10/14/2015 0.5* 1.0 - 3.6 K/uL Final  . Monocytes Relative 10/14/2015 8   Final  . Monocytes Absolute 10/14/2015 0.5  0.2 - 0.9 K/uL Final  . Eosinophils Relative 10/14/2015 3   Final  . Eosinophils Absolute 10/14/2015 0.2  0 - 0.7 K/uL Final  . Basophils Relative 10/14/2015 1   Final  . Basophils Absolute 10/14/2015 0.0  0 - 0.1 K/uL Final  . Sodium 10/14/2015 131* 135 - 145 mmol/L Final  . Potassium 10/14/2015 3.8  3.5 - 5.1 mmol/L Final  . Chloride 10/14/2015 103  101 - 111 mmol/L Final  . CO2 10/14/2015 21* 22 - 32 mmol/L Final  . Glucose, Bld 10/14/2015 126* 65 - 99 mg/dL Final  . BUN 10/14/2015 33* 6 - 20 mg/dL Final  . Creatinine, Ser 10/14/2015 1.69* 0.44 - 1.00 mg/dL Final  . Calcium 10/14/2015 8.1* 8.9 - 10.3 mg/dL Final  . GFR calc non Af Amer 10/14/2015 30* >60 mL/min Final  . GFR calc Af Amer 10/14/2015 35* >60 mL/min Final   Comment: (NOTE) The eGFR has been calculated using  the CKD EPI equation. This calculation has not been validated in all clinical situations. eGFR's persistently <60 mL/min  signify possible Chronic Kidney Disease.   . Anion gap 10/14/2015 7  5 - 15 Final    Lab Results  Component Value Date   CA125 18.8 07/23/2014    ASSESSMENT: Carcinoma of endometrium  Stage IV disease.  Patient has failed  multiple treatment Significant response to  nivolulamab .  PET scan has been reviewed independently and so stable disease with some improvement in retroperitoneal lymph node Some increase in some other lymphadenopathy with metabolic activity has increased Rash most likely due to medication possibility of that being NIVOLULAMAB cannot be ruled out At this point in time will start patient on Eucerin with aristicort  t cream If rash does not get better then possibility of  discontinuing or holding NIVOLULAMAB been giving short course of prednisone may be considered. MEDICAL DECISION MAKING:  Patient has diarrhea off and on on prednisone will hold off NIVOLULAMAB Will continue NIVOLULAMAB every 2 weeks 2 And repeat evaluation in 4 weeks by me. Cancer of uterus   Staging form: Corpus Uteri - Carcinoma, AJCC 7th Edition     Clinical: Stage IVB (T3b, N2, M1) - Signed by Forest Gleason, MD on 04/18/2015   Forest Gleason, MD   10/14/2015 2:22 PM

## 2015-10-20 ENCOUNTER — Telehealth: Payer: Self-pay | Admitting: *Deleted

## 2015-10-20 MED ORDER — METOCLOPRAMIDE HCL 10 MG PO TABS: 10.0000 mg | ORAL_TABLET | Freq: Four times a day (QID) | ORAL | Status: DC

## 2015-10-20 NOTE — Telephone Encounter (Signed)
Escribed

## 2015-10-21 ENCOUNTER — Inpatient Hospital Stay: Payer: Medicare Other

## 2015-10-21 VITALS — BP 132/72 | HR 69 | Temp 98.4°F

## 2015-10-21 DIAGNOSIS — C541 Malignant neoplasm of endometrium: Secondary | ICD-10-CM | POA: Diagnosis not present

## 2015-10-21 DIAGNOSIS — C55 Malignant neoplasm of uterus, part unspecified: Secondary | ICD-10-CM

## 2015-10-21 MED ORDER — SODIUM CHLORIDE 0.9 % IJ SOLN
10.0000 mL | INTRAMUSCULAR | Status: DC | PRN
  Administered 2015-10-21: 10 mL
  Filled 2015-10-21: qty 10

## 2015-10-21 MED ORDER — SODIUM CHLORIDE 0.9 % IV SOLN
Freq: Once | INTRAVENOUS | Status: AC
  Administered 2015-10-21: 14:00:00 via INTRAVENOUS
  Filled 2015-10-21: qty 2

## 2015-10-21 MED ORDER — HEPARIN SOD (PORK) LOCK FLUSH 100 UNIT/ML IV SOLN
500.0000 [IU] | Freq: Once | INTRAVENOUS | Status: AC | PRN
  Administered 2015-10-21: 500 [IU]
  Filled 2015-10-21: qty 5

## 2015-10-21 MED ORDER — FENTANYL 25 MCG/HR TD PT72: 25.0000 ug | MEDICATED_PATCH | TRANSDERMAL | Status: DC

## 2015-10-21 MED ORDER — SODIUM CHLORIDE 0.9 % IV SOLN
Freq: Once | INTRAVENOUS | Status: AC
  Administered 2015-10-21: 14:00:00 via INTRAVENOUS
  Filled 2015-10-21: qty 1000

## 2015-10-23 ENCOUNTER — Telehealth: Payer: Self-pay | Admitting: *Deleted

## 2015-10-23 DIAGNOSIS — C541 Malignant neoplasm of endometrium: Secondary | ICD-10-CM

## 2015-10-23 MED ORDER — POTASSIUM CHLORIDE ER 10 MEQ PO TBCR: 10.0000 meq | EXTENDED_RELEASE_TABLET | Freq: Every day | ORAL | Status: DC

## 2015-10-23 NOTE — Telephone Encounter (Signed)
Escribed

## 2015-10-28 ENCOUNTER — Inpatient Hospital Stay: Payer: Medicare Other

## 2015-10-28 DIAGNOSIS — C541 Malignant neoplasm of endometrium: Secondary | ICD-10-CM

## 2015-10-28 MED ORDER — SODIUM CHLORIDE 0.9 % IV SOLN
Freq: Once | INTRAVENOUS | Status: AC
  Administered 2015-10-28: 14:00:00 via INTRAVENOUS
  Filled 2015-10-28: qty 1000

## 2015-10-28 MED ORDER — SODIUM CHLORIDE 0.9 % IV SOLN
240.0000 mg | Freq: Once | INTRAVENOUS | Status: AC
  Administered 2015-10-28: 240 mg via INTRAVENOUS
  Filled 2015-10-28: qty 24

## 2015-10-28 MED ORDER — TRIAMCINOLONE 0.1 % CREAM:EUCERIN CREAM 1:1: 1.0000 "application " | TOPICAL_CREAM | Freq: Two times a day (BID) | CUTANEOUS | Status: DC

## 2015-10-28 MED ORDER — HEPARIN SOD (PORK) LOCK FLUSH 100 UNIT/ML IV SOLN
500.0000 [IU] | Freq: Once | INTRAVENOUS | Status: DC
Start: 2015-10-28 — End: 2015-10-28

## 2015-10-28 MED ORDER — SODIUM CHLORIDE 0.9 % IJ SOLN
10.0000 mL | INTRAMUSCULAR | Status: DC | PRN
  Filled 2015-10-28: qty 10

## 2015-11-04 ENCOUNTER — Inpatient Hospital Stay: Payer: Medicare Other

## 2015-11-04 DIAGNOSIS — C541 Malignant neoplasm of endometrium: Secondary | ICD-10-CM

## 2015-11-04 MED ORDER — SODIUM CHLORIDE 0.9 % IJ SOLN
10.0000 mL | INTRAMUSCULAR | Status: AC | PRN
  Filled 2015-11-04: qty 10

## 2015-11-04 MED ORDER — ONDANSETRON HCL 40 MG/20ML IJ SOLN
Freq: Once | INTRAMUSCULAR | Status: AC
  Administered 2015-11-04: 14:00:00 via INTRAVENOUS
  Filled 2015-11-04: qty 2

## 2015-11-04 MED ORDER — SODIUM CHLORIDE 0.9 % IV SOLN
INTRAVENOUS | Status: AC
  Administered 2015-11-04: 14:00:00 via INTRAVENOUS
  Filled 2015-11-04: qty 1000

## 2015-11-04 MED ORDER — SODIUM CHLORIDE 0.9 % IV SOLN
Freq: Once | INTRAVENOUS | Status: AC
  Administered 2015-11-04: 14:00:00 via INTRAVENOUS
  Filled 2015-11-04: qty 1000

## 2015-11-04 MED ORDER — HEPARIN SOD (PORK) LOCK FLUSH 100 UNIT/ML IV SOLN
500.0000 [IU] | Freq: Once | INTRAVENOUS | Status: AC
  Administered 2015-11-04: 500 [IU] via INTRAVENOUS
  Filled 2015-11-04: qty 5

## 2015-11-11 ENCOUNTER — Encounter: Payer: Self-pay | Admitting: Oncology

## 2015-11-11 ENCOUNTER — Inpatient Hospital Stay: Payer: Medicare Other | Attending: Oncology

## 2015-11-11 ENCOUNTER — Inpatient Hospital Stay: Payer: Medicare Other

## 2015-11-11 ENCOUNTER — Inpatient Hospital Stay (HOSPITAL_BASED_OUTPATIENT_CLINIC_OR_DEPARTMENT_OTHER): Payer: Medicare Other | Admitting: Oncology

## 2015-11-11 VITALS — BP 132/75 | HR 76 | Temp 97.4°F | Wt 124.6 lb

## 2015-11-11 DIAGNOSIS — C772 Secondary and unspecified malignant neoplasm of intra-abdominal lymph nodes: Secondary | ICD-10-CM | POA: Diagnosis not present

## 2015-11-11 DIAGNOSIS — C55 Malignant neoplasm of uterus, part unspecified: Secondary | ICD-10-CM

## 2015-11-11 DIAGNOSIS — C541 Malignant neoplasm of endometrium: Secondary | ICD-10-CM | POA: Insufficient documentation

## 2015-11-11 DIAGNOSIS — R21 Rash and other nonspecific skin eruption: Secondary | ICD-10-CM | POA: Diagnosis not present

## 2015-11-11 DIAGNOSIS — Z79899 Other long term (current) drug therapy: Secondary | ICD-10-CM | POA: Diagnosis not present

## 2015-11-11 DIAGNOSIS — Z5111 Encounter for antineoplastic chemotherapy: Secondary | ICD-10-CM | POA: Diagnosis present

## 2015-11-11 DIAGNOSIS — E860 Dehydration: Secondary | ICD-10-CM | POA: Diagnosis not present

## 2015-11-11 LAB — CBC WITH DIFFERENTIAL/PLATELET
BASOS ABS: 0 10*3/uL (ref 0–0.1)
BASOS PCT: 1 %
EOS ABS: 0.3 10*3/uL (ref 0–0.7)
Eosinophils Relative: 5 %
HEMATOCRIT: 26.1 % — AB (ref 35.0–47.0)
HEMOGLOBIN: 8.5 g/dL — AB (ref 12.0–16.0)
Lymphocytes Relative: 9 %
Lymphs Abs: 0.5 10*3/uL — ABNORMAL LOW (ref 1.0–3.6)
MCH: 28.2 pg (ref 26.0–34.0)
MCHC: 32.7 g/dL (ref 32.0–36.0)
MCV: 86.3 fL (ref 80.0–100.0)
MONO ABS: 0.4 10*3/uL (ref 0.2–0.9)
Monocytes Relative: 8 %
NEUTROS ABS: 4.2 10*3/uL (ref 1.4–6.5)
NEUTROS PCT: 77 %
Platelets: 268 10*3/uL (ref 150–440)
RBC: 3.03 MIL/uL — ABNORMAL LOW (ref 3.80–5.20)
RDW: 13.6 % (ref 11.5–14.5)
WBC: 5.5 10*3/uL (ref 3.6–11.0)

## 2015-11-11 LAB — COMPREHENSIVE METABOLIC PANEL
ALBUMIN: 3.2 g/dL — AB (ref 3.5–5.0)
ALT: 10 U/L — ABNORMAL LOW (ref 14–54)
ANION GAP: 8 (ref 5–15)
AST: 16 U/L (ref 15–41)
Alkaline Phosphatase: 112 U/L (ref 38–126)
BILIRUBIN TOTAL: 0.4 mg/dL (ref 0.3–1.2)
BUN: 29 mg/dL — AB (ref 6–20)
CO2: 21 mmol/L — AB (ref 22–32)
Calcium: 8.5 mg/dL — ABNORMAL LOW (ref 8.9–10.3)
Chloride: 101 mmol/L (ref 101–111)
Creatinine, Ser: 1.98 mg/dL — ABNORMAL HIGH (ref 0.44–1.00)
GFR calc Af Amer: 29 mL/min — ABNORMAL LOW (ref >60)
GFR calc non Af Amer: 25 mL/min — ABNORMAL LOW (ref >60)
GLUCOSE: 153 mg/dL — AB (ref 65–99)
POTASSIUM: 4 mmol/L (ref 3.5–5.1)
SODIUM: 130 mmol/L — AB (ref 135–145)
TOTAL PROTEIN: 6.7 g/dL (ref 6.5–8.1)

## 2015-11-11 LAB — MAGNESIUM: Magnesium: 2 mg/dL (ref 1.7–2.4)

## 2015-11-11 MED ORDER — HEPARIN SOD (PORK) LOCK FLUSH 100 UNIT/ML IV SOLN
500.0000 [IU] | Freq: Once | INTRAVENOUS | Status: AC
  Administered 2015-11-11: 500 [IU] via INTRAVENOUS
  Filled 2015-11-11: qty 5

## 2015-11-11 MED ORDER — SODIUM CHLORIDE 0.9 % IV SOLN
Freq: Once | INTRAVENOUS | Status: AC
  Administered 2015-11-11: 14:00:00 via INTRAVENOUS
  Filled 2015-11-11: qty 1000

## 2015-11-11 MED ORDER — SODIUM CHLORIDE 0.9 % IV SOLN
240.0000 mg | Freq: Once | INTRAVENOUS | Status: AC
  Administered 2015-11-11: 240 mg via INTRAVENOUS
  Filled 2015-11-11: qty 24

## 2015-11-11 MED ORDER — SODIUM CHLORIDE 0.9 % IJ SOLN
10.0000 mL | Freq: Once | INTRAMUSCULAR | Status: AC
  Administered 2015-11-11: 10 mL via INTRAVENOUS
  Filled 2015-11-11: qty 10

## 2015-11-11 MED ORDER — SODIUM CHLORIDE 0.9 % IV SOLN
Freq: Once | INTRAVENOUS | Status: AC
  Administered 2015-11-11: 15:00:00 via INTRAVENOUS
  Filled 2015-11-11: qty 6

## 2015-11-11 NOTE — Progress Notes (Unsigned)
Patients creatinine level was 1.98, up from 1.69 on 10/14/15.  Dr. Oliva Bustard stated that patient could go ahead and receive treatment even though creatinine level did not meet parameter requirements

## 2015-11-11 NOTE — Progress Notes (Signed)
[place] @ [facility] Telephone:([phone]) [phone]  [phone]) [phone]     [name] Graham OB: January 17, 1951  MR#: [id number]  TSV#:[id number]  [name], MD as PCP - General (Family Medicine) [name], MD (General Surgery)  CHIEF COMPLAINT:  Chief Complaint  Patient presents with  . Endometrial cancer    Chief Complaint/Diagnosis:   The patient is a 64 year old with metastatic endometrial cancer who is seen for assessment during ongoing palliative radiation therapy.  04/2011   papillary serous adenocarcinoma of the endometrium, stage IIIb (nodal metastasis)              TAHBSO and full staging,               GOG protocl using XRT and chemotherapy, significant XRT side-effects,               continued on chemotherapy off protocol, 04/2013   recurrence in the nodal areas,              DDP and docetaxel x 6 completed in December,   started on chemotherapy with Adriamycin(October, 2015) poor tolerance to letrozole  and affinator Estrogen and progesterone receptor negative tumor         hER-2/neu recetor negative. November 07, 2014 progressive disease by  tumor marker criteria and clinical examination November 12, 2014 After 6 cycles of chemotherapy because of increasing side effect chemotherapy was discontinued.  (March, 2016) Patient was started on cis-platinum and topotecan. May, 2016 Cis-platinum and topotecan  and was discontinued because of significant side effect and possibility of progressive disease.  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  Patient is here for ongoing evaluation and treatment consideration Patient has occasional abdominal pain.  Since last evaluation patient did not have any chills fever did not require any blood transfusion.  Stools remain soft but no diarrhea.  She is here for ongoing evaluation and continuation of therapy No chills or fever Abdominal pain is improved. No diarrhea. Using fentanyl patch 50   g and Vicodin for breakthrough pain Patient has lost 3 pounds of weight Patient has not lost any weight. REVIEW OF SYSTEMS:    general status: Patient is feeling weak and tired.  No change in a performance status.  No chills.  No fever.  Feeling extremely weak and tired HEENT: Alopecia.  No evidence of stomatitis increasing discomfort on the left side of the neck Lungs: No cough or shortness of breath Cardiac: No chest pain or paroxysmal nocturnal dyspnea GI: diarrhea and constipation is improved.  Abdominal pain is improved. On stay patient followed by black stool. Skin: Macular rash grade 2 Lower extremity no swelling Neurological system: No tingling.  No numbness.  No other focal signs Musculoskeletal system no bony pains poor appetite has lost  approximately 5 pounds in last month clock  PAST MEDICAL HISTORY: Past Medical History  Diagnosis Date  . Cancer ([place]) 2012  . GI problem 2013  . Cancer of uterus ([place]) 04/18/2015    PAST SURGICAL HISTORY: Past Surgical History  Procedure Laterality Date  . Abdominal hysterectomy      FAMILY HISTORY No significant family history.      ADVANCED DIRECTIVES: Patient does have advance directive   HEALTH MAINTENANCE: Social History  Substance Use Topics  . Smoking status: Never Smoker   . Smokeless tobacco: Never Used  . Alcohol Use: Yes      Allergies  Allergen Reactions  . Penicillins Rash    Current Outpatient  Prescriptions  Medication Sig Dispense Refill  . ALPRAZolam (XANAX) 0.25 MG tablet Take 1 tablet (0.25 mg total) by mouth 2 (two) times daily as needed for anxiety. 60 tablet 3  . bismuth subsalicylate (PEPTO BISMOL) 262 MG chewable tablet Chew 524 mg by mouth as needed for indigestion.     . cholestyramine (QUESTRAN) 4 G packet Take 1 packet by mouth 3 (three) times daily as needed.     . citalopram (CELEXA) 40 MG tablet Take 1 tablet (40 mg total) by mouth daily. 30 tablet 3  . fentaNYL (DURAGESIC - DOSED MCG/HR) 12 MCG/HR Place 1 patch (12.5 mcg total) onto the skin every 3 (three) days. Use with 68mg patch for total dose of 37.570m. 10 patch 0  . fentaNYL (DURAGESIC - DOSED MCG/HR) 25 MCG/HR patch Place 1 patch (25 mcg total) onto the skin every 3 (three) days. Use with 12.24m59mpatch for total dose 37.24mc47m10 patch 0  . fexofenadine (ALLEGRA) 180 MG tablet Take 180 mg by mouth as needed.     . fluticasone (FLONASE) 50 MCG/ACT nasal spray Place 1-2 sprays into the nose daily as needed.    . HYMarland KitchenROcodone-acetaminophen (NORCO/VICODIN) 5-325 MG tablet Take 1 tablet by mouth every 6 (six) hours as needed for moderate pain. 60 tablet 0  . lidocaine-prilocaine (EMLA) cream Apply to affected area once 30 g 3  . megestrol (MEGACE) 400 MG/10ML suspension Take 10 mLs (400 mg total) by mouth daily. 300 mL 6  . metoCLOPramide (REGLAN) 10 MG tablet Take 1 tablet (10 mg total) by mouth 4 (four) times daily. 120 tablet 0  . omeprazole (PRILOSEC) 20 MG capsule TAKE 1 CAPSULE BY MOUTH TWICE DAILY BEFORE MEALS 60 capsule 0  . ondansetron (ZOFRAN) 4 MG tablet Take 1 tablet (4 mg total) by mouth every 6 (six) hours as needed. 60 tablet 3  . polyethylene glycol powder (GLYCOLAX/MIRALAX) powder Take by mouth.    . potassium chloride (K-DUR) 10 MEQ tablet Take 1 tablet (10 mEq total) by mouth daily. 30 tablet 3  . predniSONE (DELTASONE) 10 MG tablet Take 3 tablets (30 mg total) by mouth daily with breakfast. Taper by  10mg48mry other day 12 tablet 0  . ranitidine (ZANTAC) 150 MG tablet Take 1 tablet by mouth 2 (two) times daily.    . sertraline (ZOLOFT) 25 MG tablet Take 25 mg by mouth daily.    . Triamcinolone Acetonide (TRIAMCINOLONE 0.1 % CREAM : EUCERIN) CREA Apply 1 application topically 2 (two) times daily. 1 each 2   No current facility-administered medications for this visit.   Facility-Administered Medications Ordered in Other Visits  Medication Dose Route Frequency Provider Last Rate Last Dose  . 0.9 %  sodium chloride infusion   Intravenous Continuous JanakForest Gleason10 mL/hr at  11/04/15 1415    . heparin lock flush 100 unit/mL  500 Units Intravenous Once Forest Gleason, MD      . sodium chloride 0.9 % injection 10 mL  10 mL Intracatheter PRN Forest Gleason, MD   10 mL at 05/06/15 1028  . sodium chloride 0.9 % injection 10 mL  10 mL Intravenous PRN Forest Gleason, MD      . sodium chloride 0.9 % injection 10 mL  10 mL Intravenous PRN Forest Gleason, MD        OBJECTIVE:  Filed Vitals:   11/11/15 1342  BP: 132/75  Pulse: 76  Temp: 97.4 F (36.3 C)     Body mass index is 22.07 kg/(m^2).    ECOG FS:1 - Symptomatic but completely ambulatory  PHYSICAL EXAM:  general status: Patient is feeling weak and tired.  No change in a performance status.  No chills.  No fever. HEENT:   No evidence of stomatitis Lungs: No cough or shortness of breath Cardiac: No chest pain or paroxysmal nocturnal dyspnea GI: No nausea no vomiting no diarrhea no abdominal pain Skin: No rash Lower extremity no swelling Neurological system: No tingling.  No numbness.  No other focal signs Musculoskeletal system no bony pains Lymphatic system: Palpable left supraclavicular lymph node  LAB RESULTS:  Infusion on 11/11/2015  Component Date Value Ref Range Status  . WBC 11/11/2015 5.5  3.6 - 11.0 K/uL Final  . RBC 11/11/2015 3.03* 3.80 - 5.20 MIL/uL Final  . Hemoglobin 11/11/2015 8.5* 12.0 - 16.0 g/dL Final  . HCT  11/11/2015 26.1* 35.0 - 47.0 % Final  . MCV 11/11/2015 86.3  80.0 - 100.0 fL Final  . MCH 11/11/2015 28.2  26.0 - 34.0 pg Final  . MCHC 11/11/2015 32.7  32.0 - 36.0 g/dL Final  . RDW 11/11/2015 13.6  11.5 - 14.5 % Final  . Platelets 11/11/2015 268  150 - 440 K/uL Final  . Neutrophils Relative % 11/11/2015 77   Final  . Neutro Abs 11/11/2015 4.2  1.4 - 6.5 K/uL Final  . Lymphocytes Relative 11/11/2015 9   Final  . Lymphs Abs 11/11/2015 0.5* 1.0 - 3.6 K/uL Final  . Monocytes Relative 11/11/2015 8   Final  . Monocytes Absolute 11/11/2015 0.4  0.2 - 0.9 K/uL Final  . Eosinophils Relative 11/11/2015 5   Final  . Eosinophils Absolute 11/11/2015 0.3  0 - 0.7 K/uL Final  . Basophils Relative 11/11/2015 1   Final  . Basophils Absolute 11/11/2015 0.0  0 - 0.1 K/uL Final  . Sodium 11/11/2015 130* 135 - 145 mmol/L Final  . Potassium 11/11/2015 4.0  3.5 - 5.1 mmol/L Final  . Chloride 11/11/2015 101  101 - 111 mmol/L Final  . CO2 11/11/2015 21* 22 - 32 mmol/L Final  . Glucose, Bld 11/11/2015 153* 65 - 99 mg/dL Final  . BUN 11/11/2015 29* 6 - 20 mg/dL Final  . Creatinine, Ser 11/11/2015 1.98* 0.44 - 1.00 mg/dL Final  . Calcium 11/11/2015 8.5* 8.9 - 10.3 mg/dL Final  . Total Protein 11/11/2015 6.7  6.5 - 8.1 g/dL Final  . Albumin 11/11/2015 3.2* 3.5 - 5.0 g/dL Final  . AST 11/11/2015 16  15 - 41 U/L Final  . ALT 11/11/2015 10* 14 - 54 U/L Final  . Alkaline Phosphatase 11/11/2015 112  38 - 126 U/L Final  . Total Bilirubin 11/11/2015 0.4  0.3 - 1.2 mg/dL Final  . GFR calc non Af Amer 11/11/2015 25* >60 mL/min Final  .  GFR calc Af Amer 11/11/2015 29* >60 mL/min Final   Comment: (NOTE) The eGFR has been calculated using the CKD EPI equation. This calculation has not been validated in all clinical situations. eGFR's persistently <60 mL/min signify possible Chronic Kidney Disease.   . Anion gap 11/11/2015 8  5 - 15 Final  . Magnesium 11/11/2015 2.0  1.7 - 2.4 mg/dL Final    Lab Results    Component Value Date   CA125 18.8 07/23/2014    ASSESSMENT: Carcinoma of endometrium  Stage IV disease.  Patient has failed  multiple treatment Significant response to  nivolulamab .  PET scan has been reviewed independently and so stable disease with some improvement in retroperitoneal lymph node Some increase in some other lymphadenopathy with metabolic activity has increased Rash most likely due to medication possibility of that being NIVOLULAMAB cannot be ruled out At this point in time will start patient on Eucerin with aristicort  t cream If rash does not get better then possibility of  discontinuing or holding NIVOLULAMAB been giving short course of prednisone may be considered. MEDICAL DECISION MAKING:  Patient's serum creatinine is slightly elevated.  Will continue NIVOLULAMAB patient was asked to drink a lot of fluid.  Will recheck in 2 weeks to decide whether continuation of therapy would be helpful depending on the palpable mass in the left supraclavicular area and if needed PET scan.  Anemia multifactorial Will continue to observe that.. Cancer of uterus   Staging form: Corpus Uteri - Carcinoma, AJCC 7th Edition     Clinical: Stage IVB (T3b, N2, M1) - Signed by Forest Gleason, MD on 04/18/2015   Forest Gleason, MD   11/11/2015 2:00 PM

## 2015-11-20 ENCOUNTER — Telehealth: Payer: Self-pay | Admitting: *Deleted

## 2015-11-20 DIAGNOSIS — C541 Malignant neoplasm of endometrium: Secondary | ICD-10-CM

## 2015-11-20 DIAGNOSIS — C55 Malignant neoplasm of uterus, part unspecified: Secondary | ICD-10-CM

## 2015-11-20 MED ORDER — FENTANYL 12 MCG/HR TD PT72: 12.5000 ug | MEDICATED_PATCH | TRANSDERMAL | Status: DC

## 2015-11-20 MED ORDER — FENTANYL 25 MCG/HR TD PT72: 25.0000 ug | MEDICATED_PATCH | TRANSDERMAL | Status: DC

## 2015-11-20 NOTE — Telephone Encounter (Signed)
rx ready for pick up. Pt will pick up at lunch time.

## 2015-11-20 NOTE — Telephone Encounter (Signed)
Erroneous encounter

## 2015-11-24 ENCOUNTER — Encounter: Payer: Self-pay | Admitting: Oncology

## 2015-11-24 ENCOUNTER — Inpatient Hospital Stay: Payer: Medicare Other

## 2015-11-24 ENCOUNTER — Inpatient Hospital Stay (HOSPITAL_BASED_OUTPATIENT_CLINIC_OR_DEPARTMENT_OTHER): Payer: Medicare Other | Admitting: Oncology

## 2015-11-24 ENCOUNTER — Telehealth: Payer: Self-pay | Admitting: *Deleted

## 2015-11-24 VITALS — BP 118/71 | HR 76 | Temp 97.2°F | Wt 121.5 lb

## 2015-11-24 DIAGNOSIS — E860 Dehydration: Secondary | ICD-10-CM

## 2015-11-24 DIAGNOSIS — Z79899 Other long term (current) drug therapy: Secondary | ICD-10-CM | POA: Diagnosis not present

## 2015-11-24 DIAGNOSIS — C541 Malignant neoplasm of endometrium: Secondary | ICD-10-CM

## 2015-11-24 DIAGNOSIS — Z5189 Encounter for other specified aftercare: Secondary | ICD-10-CM

## 2015-11-24 DIAGNOSIS — Z8542 Personal history of malignant neoplasm of other parts of uterus: Secondary | ICD-10-CM

## 2015-11-24 DIAGNOSIS — Z5111 Encounter for antineoplastic chemotherapy: Secondary | ICD-10-CM | POA: Diagnosis not present

## 2015-11-24 DIAGNOSIS — C772 Secondary and unspecified malignant neoplasm of intra-abdominal lymph nodes: Secondary | ICD-10-CM | POA: Diagnosis not present

## 2015-11-24 LAB — CBC WITH DIFFERENTIAL/PLATELET
BASOS ABS: 0 10*3/uL (ref 0–0.1)
Basophils Relative: 1 %
Eosinophils Absolute: 0.4 10*3/uL (ref 0–0.7)
Eosinophils Relative: 8 %
HEMATOCRIT: 25.2 % — AB (ref 35.0–47.0)
Hemoglobin: 8.2 g/dL — ABNORMAL LOW (ref 12.0–16.0)
LYMPHS PCT: 13 %
Lymphs Abs: 0.6 10*3/uL — ABNORMAL LOW (ref 1.0–3.6)
MCH: 27.5 pg (ref 26.0–34.0)
MCHC: 32.5 g/dL (ref 32.0–36.0)
MCV: 84.6 fL (ref 80.0–100.0)
Monocytes Absolute: 0.4 10*3/uL (ref 0.2–0.9)
Monocytes Relative: 9 %
NEUTROS ABS: 3.3 10*3/uL (ref 1.4–6.5)
Neutrophils Relative %: 69 %
Platelets: 228 10*3/uL (ref 150–440)
RBC: 2.98 MIL/uL — AB (ref 3.80–5.20)
RDW: 13.5 % (ref 11.5–14.5)
WBC: 4.8 10*3/uL (ref 3.6–11.0)

## 2015-11-24 LAB — COMPREHENSIVE METABOLIC PANEL
ALBUMIN: 3.1 g/dL — AB (ref 3.5–5.0)
ALK PHOS: 154 U/L — AB (ref 38–126)
ALT: 17 U/L (ref 14–54)
AST: 21 U/L (ref 15–41)
Anion gap: 8 (ref 5–15)
BILIRUBIN TOTAL: 0.3 mg/dL (ref 0.3–1.2)
BUN: 32 mg/dL — AB (ref 6–20)
CALCIUM: 8.4 mg/dL — AB (ref 8.9–10.3)
CO2: 20 mmol/L — ABNORMAL LOW (ref 22–32)
Chloride: 105 mmol/L (ref 101–111)
Creatinine, Ser: 1.83 mg/dL — ABNORMAL HIGH (ref 0.44–1.00)
GFR calc Af Amer: 32 mL/min — ABNORMAL LOW (ref >60)
GFR calc non Af Amer: 28 mL/min — ABNORMAL LOW (ref >60)
GLUCOSE: 102 mg/dL — AB (ref 65–99)
POTASSIUM: 3.9 mmol/L (ref 3.5–5.1)
Sodium: 133 mmol/L — ABNORMAL LOW (ref 135–145)
TOTAL PROTEIN: 6.3 g/dL — AB (ref 6.5–8.1)

## 2015-11-24 LAB — MAGNESIUM: Magnesium: 2 mg/dL (ref 1.7–2.4)

## 2015-11-24 MED ORDER — HEPARIN SOD (PORK) LOCK FLUSH 100 UNIT/ML IV SOLN
INTRAVENOUS | Status: AC
  Filled 2015-11-24: qty 5

## 2015-11-24 MED ORDER — HEPARIN SOD (PORK) LOCK FLUSH 100 UNIT/ML IV SOLN
500.0000 [IU] | Freq: Once | INTRAVENOUS | Status: AC
  Administered 2015-11-24: 500 [IU] via INTRAVENOUS

## 2015-11-24 MED ORDER — SODIUM CHLORIDE 0.9 % IV SOLN
Freq: Once | INTRAVENOUS | Status: AC
  Administered 2015-11-24: 15:00:00 via INTRAVENOUS
  Filled 2015-11-24: qty 1000

## 2015-11-24 MED ORDER — SODIUM CHLORIDE 0.9 % IJ SOLN
10.0000 mL | Freq: Once | INTRAMUSCULAR | Status: AC
  Administered 2015-11-24: 10 mL via INTRAVENOUS
  Filled 2015-11-24: qty 10

## 2015-11-24 MED ORDER — SODIUM CHLORIDE 0.9 % IJ SOLN
10.0000 mL | INTRAMUSCULAR | Status: DC | PRN
  Filled 2015-11-24: qty 10

## 2015-11-24 NOTE — Progress Notes (Signed)
Patient acute add on for lightheadedness and weakness.  States she had diarrhea yesterday as well and thought she might be dehydrated.

## 2015-11-24 NOTE — Telephone Encounter (Signed)
Called to report that she is lightheaded and very weak. She did not get IVF the past 2 weeks and feels she needs them today. She states she is scheduled for treatment tomorrow

## 2015-11-24 NOTE — Telephone Encounter (Signed)
Per Dr Oliva Bustard, pt to come in for lab/ md/ IVF. She agrees to be here at 115

## 2015-11-24 NOTE — Progress Notes (Signed)
[place] @ [facility] Telephone:(336) [phone]  Fax:(336) [phone]     [name] Graham OB: [birth year]  MR#: [id number]  BDZ#:[id number]  [name], MD as PCP - General (Family Medicine) [name], MD (General Surgery)  CHIEF COMPLAINT:  Chief Complaint  Patient presents with  . Endometrial cancer    Chief Complaint/Diagnosis:   The patient is a 64 year old with metastatic endometrial cancer who is seen for assessment during ongoing palliative radiation therapy.  04/2011   papillary serous adenocarcinoma of the endometrium, stage IIIb (nodal metastasis)              TAHBSO and full staging,               GOG protocl using XRT and chemotherapy, significant XRT side-effects,               continued on chemotherapy off protocol, 04/2013   recurrence in the nodal areas,              DDP and docetaxel x 6 completed in December,   started on chemotherapy with Adriamycin(October, 2015) poor tolerance to letrozole  and affinator Estrogen and progesterone receptor negative tumor         hER-2/neu recetor negative. November 07, 2014 progressive disease by  tumor marker criteria and clinical examination November 12, 2014 After 6 cycles of chemotherapy because of increasing side effect chemotherapy was discontinued.  (March, 2016) Patient was started on cis-platinum and topotecan. May, 2016 Cis-platinum and topotecan  and was discontinued because of significant side effect and possibility of progressive disease.  Patient would be started on NIVOLULAMAB on compassionate use basis       Oncology Flowsheet 10/03/2015 10/07/2015 10/14/2015 10/21/2015 10/28/2015 11/04/2015 11/11/2015  dexamethasone (DECADRON) IV - [ 8 mg ] - - - - -  diphenhydrAMINE (BENADRYL) PO - - - - - - -  metoCLOPramide (REGLAN) IV - - - - - - -  nivolumab (OPDIVO) IV - - 240 mg - 240 mg - 240 mg  ondansetron (ZOFRAN) IV [ 8 mg ] - - [ 4 mg ] - [ 4 mg ] [ 12 mg ]  promethazine  (PHENERGAN) IV - - - - - - -    INTERVAL HISTORY: 64 year old lady with stage IV carcinoma of endometrium metastases to the lymph node.  Patient is here for ongoing evaluation and treatment consideration Patient   Had  one episode of diarrhea yesterday but no diarrhea today.  Patient has a previous history of colitis. Feeling extremely weak and tired and lightheaded. BUN is 32.  Serum creatinine is 1.83.  Here for further follow-up patient is due for [facility] tomorrow  REVIEW OF SYSTEMS:    general status: Patient is feeling weak and tired.  Feeling lightheaded No change in a performance status.  No chills.  No fever.  Feeling extremely weak and tired HEENT: Alopecia.  No evidence of stomatitis increasing discomfort on the left side of the neck Lungs: No cough or shortness of breath Cardiac: No chest pain or paroxysmal nocturnal dyspnea GI: diarrhea and constipation is improved.  Abdominal pain is improved. On stay patient followed by black stool. Skin: Macular rash grade 2 Lower extremity no swelling Neurological system: No tingling.  No numbness.  No other focal signs Musculoskeletal system no bony pains poor appetite has lost approximately 5 pounds in last month clock  PAST MEDICAL HISTORY: Past Medical History  Diagnosis Date  . Cancer ([place]) 2012  .  GI problem 2013  . Cancer of uterus ([place]) 04/18/2015    PAST SURGICAL HISTORY: Past Surgical History  Procedure Laterality Date  . Abdominal hysterectomy      FAMILY HISTORY No significant family history.      ADVANCED DIRECTIVES: Patient does have advance directive   HEALTH MAINTENANCE: Social History  Substance Use Topics  . Smoking status: Never Smoker   . Smokeless tobacco: Never Used  . Alcohol Use: Yes      Allergies  Allergen Reactions  . Penicillins Rash    Current Outpatient Prescriptions  Medication Sig Dispense Refill  . ALPRAZolam (XANAX) 0.25 MG tablet Take 1 tablet (0.25 mg total) by mouth 2  (two) times daily as needed for anxiety. 60 tablet 3  . bismuth subsalicylate (PEPTO BISMOL) 262 MG chewable tablet Chew 524 mg by mouth as needed for indigestion.     . cholestyramine (QUESTRAN) 4 G packet Take 1 packet by mouth 3 (three) times daily as needed.     . citalopram (CELEXA) 40 MG tablet Take 1 tablet (40 mg total) by mouth daily. 30 tablet 3  . fentaNYL (DURAGESIC - DOSED MCG/HR) 12 MCG/HR Place 1 patch (12.5 mcg total) onto the skin every 3 (three) days. Use with 242mg patch for total dose of 37.581m. 10 patch 0  . fentaNYL (DURAGESIC - DOSED MCG/HR) 25 MCG/HR patch Place 1 patch (25 mcg total) onto the skin every 3 (three) days. Use with 12.42m84mpatch for total dose 37.42mc20m10 patch 0  . fexofenadine (ALLEGRA) 180 MG tablet Take 180 mg by mouth as needed.     . fluticasone (FLONASE) 50 MCG/ACT nasal spray Place 1-2 sprays into the nose daily as needed.    . HYMarland KitchenROcodone-acetaminophen (NORCO/VICODIN) 5-325 MG tablet Take 1 tablet by mouth every 6 (six) hours as needed for moderate pain. 60 tablet 0  . lidocaine-prilocaine (EMLA) cream Apply to affected area once 30 g 3  . megestrol (MEGACE) 400 MG/10ML suspension Take 10 mLs (400 mg total) by mouth daily. 300 mL 6  . metoCLOPramide (REGLAN) 10 MG tablet Take 1 tablet (10 mg total) by mouth 4 (four) times daily. 120 tablet 0  . omeprazole (PRILOSEC) 20 MG capsule TAKE 1 CAPSULE BY MOUTH TWICE DAILY BEFORE MEALS 60 capsule 0  . ondansetron (ZOFRAN) 4 MG tablet Take 1 tablet (4 mg total) by mouth every 6 (six) hours as needed. 60 tablet 3  . polyethylene glycol powder (GLYCOLAX/MIRALAX) powder Take by mouth.    . potassium chloride (K-DUR) 10 MEQ tablet Take 1 tablet (10 mEq total) by mouth daily. 30 tablet 3  . predniSONE (DELTASONE) 10 MG tablet Take 3 tablets (30 mg total) by mouth daily with breakfast. Taper by 10mg46mry other day 12 tablet 0  . ranitidine (ZANTAC) 150 MG tablet Take 1 tablet by mouth 2 (two) times daily.    .  sertraline (ZOLOFT) 25 MG tablet Take 25 mg by mouth daily.    . Triamcinolone Acetonide (TRIAMCINOLONE 0.1 % CREAM : EUCERIN) CREA Apply 1 application topically 2 (two) times daily. 1 each 2   No current facility-administered medications for this visit.   Facility-Administered Medications Ordered in Other Visits  Medication Dose Route Frequency Provider Last Rate Last Dose  . 0.9 %  sodium chloride infusion   Intravenous Continuous JanakForest Gleason10 mL/hr at 11/04/15 1415    . sodium chloride 0.9 % injection 10 mL  10 mL Intracatheter PRN JanakForest Gleason  10  mL at 05/06/15 1028  . sodium chloride 0.9 % injection 10 mL  10 mL Intravenous PRN Forest Gleason, MD      . sodium chloride 0.9 % injection 10 mL  10 mL Intravenous PRN Forest Gleason, MD        OBJECTIVE:  Filed Vitals:   11/24/15 1417  BP: 118/71  Pulse: 76  Temp: 97.2 F (36.2 C)     Body mass index is 21.52 kg/(m^2).    ECOG FS:1 - Symptomatic but completely ambulatory  PHYSICAL EXAM:  general status: Patient is feeling weak and tired.  No change in a performance status.  No chills.  No fever. HEENT:   No evidence of stomatitis Lungs: No cough or shortness of breath Cardiac: No chest pain or paroxysmal nocturnal dyspnea GI: No nausea no vomiting no diarrhea no abdominal pain Skin: Poor skin turgor. Clinical signs of dehydration Lower extremity no swelling Neurological system: No tingling.  No numbness.  No other focal signs Musculoskeletal system no bony pains Lymphatic system: Palpable left supraclavicular lymph node  LAB RESULTS:  Infusion on 11/24/2015  Component Date Value Ref Range Status  . WBC 11/24/2015 4.8  3.6 - 11.0 K/uL Final  . RBC 11/24/2015 2.98* 3.80 - 5.20 MIL/uL Final  . Hemoglobin 11/24/2015 8.2* 12.0 - 16.0 g/dL Final  . HCT 11/24/2015 25.2* 35.0 - 47.0 % Final  . MCV 11/24/2015 84.6  80.0 - 100.0 fL Final  . MCH 11/24/2015 27.5  26.0 - 34.0 pg Final  . MCHC 11/24/2015 32.5  32.0 - 36.0  g/dL Final  . RDW 11/24/2015 13.5  11.5 - 14.5 % Final  . Platelets 11/24/2015 228  150 - 440 K/uL Final  . Neutrophils Relative % 11/24/2015 69   Final  . Neutro Abs 11/24/2015 3.3  1.4 - 6.5 K/uL Final  . Lymphocytes Relative 11/24/2015 13   Final  . Lymphs Abs 11/24/2015 0.6* 1.0 - 3.6 K/uL Final  . Monocytes Relative 11/24/2015 9   Final  . Monocytes Absolute 11/24/2015 0.4  0.2 - 0.9 K/uL Final  . Eosinophils Relative 11/24/2015 8   Final  . Eosinophils Absolute 11/24/2015 0.4  0 - 0.7 K/uL Final  . Basophils Relative 11/24/2015 1   Final  . Basophils Absolute 11/24/2015 0.0  0 - 0.1 K/uL Final  . Magnesium 11/24/2015 2.0  1.7 - 2.4 mg/dL Final  . Sodium 11/24/2015 133* 135 - 145 mmol/L Final  . Potassium 11/24/2015 3.9  3.5 - 5.1 mmol/L Final  . Chloride 11/24/2015 105  101 - 111 mmol/L Final  . CO2 11/24/2015 20* 22 - 32 mmol/L Final  . Glucose, Bld 11/24/2015 102* 65 - 99 mg/dL Final  . BUN 11/24/2015 32* 6 - 20 mg/dL Final  . Creatinine, Ser 11/24/2015 1.83* 0.44 - 1.00 mg/dL Final  . Calcium 11/24/2015 8.4* 8.9 - 10.3 mg/dL Final  . Total Protein 11/24/2015 6.3* 6.5 - 8.1 g/dL Final  . Albumin 11/24/2015 3.1* 3.5 - 5.0 g/dL Final  . AST 11/24/2015 21  15 - 41 U/L Final  . ALT 11/24/2015 17  14 - 54 U/L Final  . Alkaline Phosphatase 11/24/2015 154* 38 - 126 U/L Final  . Total Bilirubin 11/24/2015 0.3  0.3 - 1.2 mg/dL Final  . GFR calc non Af Amer 11/24/2015 28* >60 mL/min Final  . GFR calc Af Amer 11/24/2015 32* >60 mL/min Final   Comment: (NOTE) The eGFR has been calculated using the CKD EPI equation. This calculation has not  been validated in all clinical situations. eGFR's persistently <60 mL/min signify possible Chronic Kidney Disease.   . Anion gap 11/24/2015 8  5 - 15 Final    Lab Results  Component Value Date   CA125 18.8 07/23/2014    ASSESSMENT: Carcinoma of endometrium  Stage IV disease.  Patient has failed  multiple treatment Will continue IV fluid  today and tomorrow.  Signs of mild dehydration on clinical examination.  Hemoglobin is 8.3  Will follow that and get iron studies done  Continue chemotherapy and reevaluation after all the information is available for possibility of PET scanning for reassessment after next cycle of treatment MEDICAL DECISION MAKING:  IV fluid today tomorrow NIVOLULAMAB tomorrow IV fluid in between the patient has any more diarrhea to get in touch with me then prednisone can be started PET scan can be scheduled for restaging Duration of visit is 25 minutes and 50% of time was spent discussing keeping her hydrated.  Then to get in touch with me patient develops any diarrhea. All lab data has been reviewed  Anemia multifactorial Will continue to observe that.. Cancer of uterus   Staging form: Corpus Uteri - Carcinoma, AJCC 7th Edition     Clinical: Stage IVB (T3b, N2, M1) - Signed by Forest Gleason, MD on 04/18/2015   Forest Gleason, MD   11/24/2015 2:31 PM

## 2015-11-25 ENCOUNTER — Inpatient Hospital Stay: Payer: Medicare Other | Admitting: Oncology

## 2015-11-25 ENCOUNTER — Inpatient Hospital Stay: Payer: Medicare Other

## 2015-11-25 DIAGNOSIS — Z5111 Encounter for antineoplastic chemotherapy: Secondary | ICD-10-CM | POA: Diagnosis not present

## 2015-11-25 DIAGNOSIS — C541 Malignant neoplasm of endometrium: Secondary | ICD-10-CM

## 2015-11-25 MED ORDER — SODIUM CHLORIDE 0.9 % IV SOLN
Freq: Once | INTRAVENOUS | Status: AC
  Administered 2015-11-25: 11:00:00 via INTRAVENOUS
  Filled 2015-11-25: qty 4

## 2015-11-25 MED ORDER — NIVOLUMAB CHEMO INJECTION 100 MG/10ML
240.0000 mg | Freq: Once | INTRAVENOUS | Status: AC
  Administered 2015-11-25: 240 mg via INTRAVENOUS
  Filled 2015-11-25: qty 24

## 2015-11-25 MED ORDER — HEPARIN SOD (PORK) LOCK FLUSH 100 UNIT/ML IV SOLN
500.0000 [IU] | Freq: Once | INTRAVENOUS | Status: AC
  Administered 2015-11-25: 500 [IU] via INTRAVENOUS

## 2015-11-25 MED ORDER — SODIUM CHLORIDE 0.9 % IV SOLN
Freq: Once | INTRAVENOUS | Status: AC
  Administered 2015-11-25: 11:00:00 via INTRAVENOUS
  Filled 2015-11-25: qty 1000

## 2015-12-03 ENCOUNTER — Inpatient Hospital Stay: Payer: Medicare Other

## 2015-12-03 VITALS — BP 115/71 | HR 73 | Temp 98.6°F | Resp 18

## 2015-12-03 DIAGNOSIS — C541 Malignant neoplasm of endometrium: Secondary | ICD-10-CM

## 2015-12-03 DIAGNOSIS — Z5111 Encounter for antineoplastic chemotherapy: Secondary | ICD-10-CM | POA: Diagnosis not present

## 2015-12-03 MED ORDER — SODIUM CHLORIDE 0.9 % IV SOLN
Freq: Once | INTRAVENOUS | Status: AC
  Administered 2015-12-03: 14:00:00 via INTRAVENOUS
  Filled 2015-12-03: qty 1000

## 2015-12-03 MED ORDER — SODIUM CHLORIDE 0.9 % IJ SOLN
10.0000 mL | INTRAMUSCULAR | Status: DC | PRN
  Administered 2015-12-03: 10 mL via INTRAVENOUS
  Filled 2015-12-03: qty 10

## 2015-12-03 MED ORDER — HEPARIN SOD (PORK) LOCK FLUSH 100 UNIT/ML IV SOLN
500.0000 [IU] | Freq: Once | INTRAVENOUS | Status: AC
  Administered 2015-12-03: 500 [IU] via INTRAVENOUS
  Filled 2015-12-03: qty 5

## 2015-12-09 ENCOUNTER — Inpatient Hospital Stay (HOSPITAL_BASED_OUTPATIENT_CLINIC_OR_DEPARTMENT_OTHER): Payer: Medicare Other | Admitting: Oncology

## 2015-12-09 ENCOUNTER — Telehealth: Payer: Self-pay | Admitting: *Deleted

## 2015-12-09 ENCOUNTER — Inpatient Hospital Stay: Payer: Medicare Other | Attending: Oncology

## 2015-12-09 ENCOUNTER — Encounter: Payer: Self-pay | Admitting: Oncology

## 2015-12-09 ENCOUNTER — Inpatient Hospital Stay: Payer: Medicare Other

## 2015-12-09 VITALS — BP 117/71 | HR 80 | Temp 97.9°F | Wt 121.3 lb

## 2015-12-09 DIAGNOSIS — Z79899 Other long term (current) drug therapy: Secondary | ICD-10-CM

## 2015-12-09 DIAGNOSIS — C541 Malignant neoplasm of endometrium: Secondary | ICD-10-CM

## 2015-12-09 DIAGNOSIS — D649 Anemia, unspecified: Secondary | ICD-10-CM | POA: Insufficient documentation

## 2015-12-09 DIAGNOSIS — R21 Rash and other nonspecific skin eruption: Secondary | ICD-10-CM | POA: Insufficient documentation

## 2015-12-09 DIAGNOSIS — C772 Secondary and unspecified malignant neoplasm of intra-abdominal lymph nodes: Secondary | ICD-10-CM | POA: Insufficient documentation

## 2015-12-09 DIAGNOSIS — C55 Malignant neoplasm of uterus, part unspecified: Secondary | ICD-10-CM

## 2015-12-09 DIAGNOSIS — Z5111 Encounter for antineoplastic chemotherapy: Secondary | ICD-10-CM | POA: Diagnosis not present

## 2015-12-09 DIAGNOSIS — Z23 Encounter for immunization: Secondary | ICD-10-CM | POA: Diagnosis not present

## 2015-12-09 LAB — COMPREHENSIVE METABOLIC PANEL
ALT: 10 U/L — AB (ref 14–54)
AST: 16 U/L (ref 15–41)
Albumin: 3.2 g/dL — ABNORMAL LOW (ref 3.5–5.0)
Alkaline Phosphatase: 121 U/L (ref 38–126)
Anion gap: 7 (ref 5–15)
BUN: 28 mg/dL — AB (ref 6–20)
CHLORIDE: 101 mmol/L (ref 101–111)
CO2: 24 mmol/L (ref 22–32)
CREATININE: 1.98 mg/dL — AB (ref 0.44–1.00)
Calcium: 8.7 mg/dL — ABNORMAL LOW (ref 8.9–10.3)
GFR calc Af Amer: 29 mL/min — ABNORMAL LOW (ref >60)
GFR calc non Af Amer: 25 mL/min — ABNORMAL LOW (ref >60)
Glucose, Bld: 103 mg/dL — ABNORMAL HIGH (ref 65–99)
Potassium: 3.8 mmol/L (ref 3.5–5.1)
SODIUM: 132 mmol/L — AB (ref 135–145)
Total Bilirubin: 0.3 mg/dL (ref 0.3–1.2)
Total Protein: 6.6 g/dL (ref 6.5–8.1)

## 2015-12-09 LAB — CBC WITH DIFFERENTIAL/PLATELET
Basophils Absolute: 0 10*3/uL (ref 0–0.1)
Basophils Relative: 1 %
EOS PCT: 8 %
Eosinophils Absolute: 0.4 10*3/uL (ref 0–0.7)
HEMATOCRIT: 24.6 % — AB (ref 35.0–47.0)
HEMOGLOBIN: 8.1 g/dL — AB (ref 12.0–16.0)
LYMPHS PCT: 10 %
Lymphs Abs: 0.5 10*3/uL — ABNORMAL LOW (ref 1.0–3.6)
MCH: 28 pg (ref 26.0–34.0)
MCHC: 33 g/dL (ref 32.0–36.0)
MCV: 84.9 fL (ref 80.0–100.0)
MONO ABS: 0.5 10*3/uL (ref 0.2–0.9)
MONOS PCT: 10 %
NEUTROS ABS: 3.7 10*3/uL (ref 1.4–6.5)
Neutrophils Relative %: 71 %
Platelets: 252 10*3/uL (ref 150–440)
RBC: 2.89 MIL/uL — ABNORMAL LOW (ref 3.80–5.20)
RDW: 14 % (ref 11.5–14.5)
WBC: 5.2 10*3/uL (ref 3.6–11.0)

## 2015-12-09 LAB — SAMPLE TO BLOOD BANK

## 2015-12-09 LAB — IRON AND TIBC
Iron: 29 ug/dL (ref 28–170)
SATURATION RATIOS: 15 % (ref 10.4–31.8)
TIBC: 195 ug/dL — ABNORMAL LOW (ref 250–450)
UIBC: 166 ug/dL

## 2015-12-09 LAB — MAGNESIUM: Magnesium: 2.2 mg/dL (ref 1.7–2.4)

## 2015-12-09 LAB — FERRITIN: FERRITIN: 561 ng/mL — AB (ref 11–307)

## 2015-12-09 MED ORDER — AMITRIPTYLINE HCL 25 MG PO TABS: 25.0000 mg | ORAL_TABLET | Freq: Every day | ORAL | Status: DC

## 2015-12-09 MED ORDER — SODIUM CHLORIDE 0.9 % IV SOLN
Freq: Once | INTRAVENOUS | Status: AC
  Administered 2015-12-09: 11:00:00 via INTRAVENOUS
  Filled 2015-12-09: qty 1000

## 2015-12-09 MED ORDER — SODIUM CHLORIDE 0.9 % IV SOLN
Freq: Once | INTRAVENOUS | Status: AC
Start: 2015-12-09 — End: 2015-12-09
  Administered 2015-12-09: 11:00:00 via INTRAVENOUS
  Filled 2015-12-09: qty 1000

## 2015-12-09 MED ORDER — HEPARIN SOD (PORK) LOCK FLUSH 100 UNIT/ML IV SOLN
500.0000 [IU] | Freq: Once | INTRAVENOUS | Status: AC
  Administered 2015-12-09: 500 [IU] via INTRAVENOUS
  Filled 2015-12-09: qty 5

## 2015-12-09 MED ORDER — INFLUENZA VAC SPLIT QUAD 0.5 ML IM SUSY
0.5000 mL | PREFILLED_SYRINGE | Freq: Once | INTRAMUSCULAR | Status: AC
  Administered 2015-12-09: 0.5 mL via INTRAMUSCULAR
  Filled 2015-12-09: qty 0.5

## 2015-12-09 MED ORDER — HEPARIN SOD (PORK) LOCK FLUSH 100 UNIT/ML IV SOLN
500.0000 [IU] | Freq: Once | INTRAVENOUS | Status: AC | PRN
  Administered 2015-12-09: 500 [IU]

## 2015-12-09 MED ORDER — SODIUM CHLORIDE 0.9 % IJ SOLN
10.0000 mL | INTRAMUSCULAR | Status: DC | PRN
  Administered 2015-12-09: 10 mL via INTRAVENOUS
  Filled 2015-12-09: qty 10

## 2015-12-09 MED ORDER — LIDOCAINE-PRILOCAINE 2.5-2.5 % EX CREA: TOPICAL_CREAM | CUTANEOUS | Status: DC

## 2015-12-09 MED ORDER — SODIUM CHLORIDE 0.9 % IV SOLN
240.0000 mg | Freq: Once | INTRAVENOUS | Status: AC
  Administered 2015-12-09: 240 mg via INTRAVENOUS
  Filled 2015-12-09: qty 24

## 2015-12-09 NOTE — Telephone Encounter (Signed)
Patient called to let us know EMLA cream was e-scribed to pharmacy instead of Eucerin.  She had left the pharmacy when she realized it and they will not take it back.  Patient states it was $31.  If someone else can use it she will be glad to give it to them.  She did have a refill for the Eucerin.

## 2015-12-09 NOTE — Progress Notes (Signed)
[place] @ [facility] [phone]  [phone]     [name] Graham OB: [birth year]  MR#: [id number]  LOV#:[id number]  [name], MD as PCP - General (Family Medicine) [name], MD (General Surgery)  CHIEF COMPLAINT:  Chief Complaint  Patient presents with  . Endometrial Cancer    Chief Complaint/Diagnosis:   The patient is a 65 year old with metastatic endometrial cancer who is seen for assessment during ongoing palliative radiation therapy.  04/2011   papillary serous adenocarcinoma of the endometrium, stage IIIb (nodal metastasis)              TAHBSO and full staging,               GOG protocl using XRT and chemotherapy, significant XRT side-effects,               continued on chemotherapy off protocol, 04/2013   recurrence in the nodal areas,              DDP and docetaxel x 6 completed in December,   started on chemotherapy with Adriamycin(October, 2015) poor tolerance to letrozole  and affinator Estrogen and progesterone receptor negative tumor         hER-2/neu recetor negative. November 07, 2014 progressive disease by  tumor marker criteria and clinical examination November 12, 2014 After 6 cycles of chemotherapy because of increasing side effect chemotherapy was discontinued.  (March, 2016) Patient was started on cis-platinum and topotecan. May, 2016 Cis-platinum and topotecan  and was discontinued because of significant side effect and possibility of progressive disease.  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  Patient is here for ongoing evaluation and treatment consideration Patient   Had  one episode of diarrhea yesterday but no diarrhea today.  Patient has a previous history of colitis. Patient has nausea this morning feeling weak and tired.  Patient continues to have generalized itching and rash.  Generalized itching is all over the body rash is maculopapular all over the body.  Dry skin.  Appetite has been stable.  Weight is stable.  Vital signs have been reviewed there stable  REVIEW OF SYSTEMS:    general status: Patient is feeling weak and tired.  Feeling lightheaded No change in a performance status.  No chills.  No fever.  Feeling extremely weak and tired HEENT: Alopecia.  No evidence of stomatitis increasing discomfort on the left side of the neck Lungs: No cough or shortness of breath Cardiac: No chest pain or paroxysmal nocturnal dyspnea GI: diarrhea and constipation is improved.  Abdominal pain is improved. On stay patient followed by black stool. Skin: Macular rash grade 2, as described above.  Patient is taking Zyrtec in the morning and Benadryl in the night   Lower extremity  no swelling Neurological system: No tingling.  No numbness.  No other focal signs Musculoskeletal system no bony pains poor appetite has lost approximately 5 pounds in last month clock  PAST MEDICAL HISTORY: Past Medical History  Diagnosis Date  . Cancer ([place]) 2012  . GI problem 2013  . Cancer of uterus ([place]) 04/18/2015    PAST SURGICAL HISTORY: Past Surgical History  Procedure Laterality Date  . Abdominal hysterectomy      FAMILY HISTORY No significant family history.      ADVANCED DIRECTIVES: Patient does have advance directive   HEALTH MAINTENANCE: Social History  Substance Use Topics  . Smoking status: Never Smoker   . Smokeless tobacco: Never Used  .  Alcohol Use: Yes      Allergies  Allergen Reactions  . Penicillins Rash    Current Outpatient Prescriptions  Medication Sig Dispense Refill  . ALPRAZolam (XANAX) 0.25 MG tablet Take 1 tablet (0.25 mg total) by mouth 2 (two) times daily as needed for anxiety. 60 tablet 3  . bismuth subsalicylate (PEPTO BISMOL) 262 MG chewable tablet Chew 524 mg by mouth as needed for indigestion.     . cholestyramine (QUESTRAN) 4 G packet Take 1 packet by mouth 3 (three) times daily as needed.     . citalopram (CELEXA) 40 MG tablet Take 1 tablet (40 mg total) by mouth daily. 30 tablet 3  . fentaNYL (DURAGESIC - DOSED MCG/HR) 12 MCG/HR Place 1 patch (12.5 mcg total) onto the skin every 3 (three) days. Use with 170mg patch for total dose of 37.597m. 10 patch 0  . fentaNYL (DURAGESIC - DOSED MCG/HR) 25 MCG/HR patch Place 1 patch (25 mcg total) onto the skin every 3 (three) days. Use with 12.70m52mpatch for total dose 37.70mc17m10 patch 0  . fexofenadine (ALLEGRA) 180 MG tablet Take 180 mg by mouth as needed.     . fluticasone (FLONASE) 50 MCG/ACT nasal spray Place 1-2 sprays into the nose daily as needed.    . HYMarland KitchenROcodone-acetaminophen (NORCO/VICODIN) 5-325 MG tablet Take 1 tablet by mouth every 6 (six) hours as needed for moderate pain. 60 tablet 0  . lidocaine-prilocaine (EMLA) cream Apply to affected area once 30 g 3  . megestrol (MEGACE) 400 MG/10ML suspension Take 10 mLs (400 mg total) by mouth daily. 300 mL 6  . metoCLOPramide (REGLAN) 10 MG tablet Take 1 tablet (10 mg total) by mouth 4 (four) times daily. 120 tablet 0  . omeprazole (PRILOSEC) 20 MG capsule TAKE 1 CAPSULE BY MOUTH TWICE DAILY BEFORE MEALS 60 capsule 0  . ondansetron (ZOFRAN) 4 MG tablet Take 1 tablet (4 mg total) by mouth every 6 (six) hours as needed. 60 tablet 3  . polyethylene glycol powder (GLYCOLAX/MIRALAX) powder Take by mouth.    . potassium chloride (K-DUR) 10 MEQ tablet Take 1 tablet (10 mEq total) by mouth daily. 30 tablet 3  .  predniSONE (DELTASONE) 10 MG tablet Take 3 tablets (30 mg total) by mouth daily with breakfast. Taper by 10mg74mry other day 12 tablet 0  . ranitidine (ZANTAC) 150 MG tablet Take 1 tablet by mouth 2 (two) times daily.    . sertraline (ZOLOFT) 25 MG tablet Take 25 mg by mouth daily.    . Triamcinolone Acetonide (TRIAMCINOLONE 0.1 % CREAM : EUCERIN) CREA Apply 1 application topically 2 (two) times daily. 1 each 2   No current facility-administered medications for this visit.   Facility-Administered Medications Ordered in Other Visits  Medication Dose Route Frequency  Provider Last Rate Last Dose  . 0.9 %  sodium chloride infusion   Intravenous Continuous Forest Gleason, MD 10 mL/hr at 11/04/15 1415    . heparin lock flush 100 unit/mL  500 Units Intravenous Once Forest Gleason, MD      . sodium chloride 0.9 % injection 10 mL  10 mL Intracatheter PRN Forest Gleason, MD   10 mL at 05/06/15 1028  . sodium chloride 0.9 % injection 10 mL  10 mL Intravenous PRN Forest Gleason, MD      . sodium chloride 0.9 % injection 10 mL  10 mL Intravenous PRN Forest Gleason, MD      . sodium chloride 0.9 % injection 10 mL  10 mL Intravenous PRN Forest Gleason, MD   10 mL at 12/09/15 0905    OBJECTIVE:  Filed Vitals:   12/09/15 0939  BP: 117/71  Pulse: 80  Temp: 97.9 F (36.6 C)     Body mass index is 21.48 kg/(m^2).    ECOG FS:1 - Symptomatic but completely ambulatory  PHYSICAL EXAM:  general status: Patient is feeling weak and tired.  No change in a performance status.  No chills.  No fever. HEENT:   No evidence of stomatitis Lungs: No cough or shortness of breath Cardiac: No chest pain or paroxysmal nocturnal dyspnea GI: No nausea no vomiting no diarrhea no abdominal pain Skin: Poor skin turgor. rash maculopapular in the back as well as in abdominal area and upper extremity  Clinical signs of dehydration Lower extremity no swelling Neurological system: No tingling.  No numbness.  No other focal  signs Musculoskeletal system no bony pains Lymphatic system: Palpable left supraclavicular lymph node. Slightly increased size   LAB RESULTS:  Infusion on 12/09/2015  Component Date Value Ref Range Status  . WBC 12/09/2015 5.2  3.6 - 11.0 K/uL Final  . RBC 12/09/2015 2.89* 3.80 - 5.20 MIL/uL Final  . Hemoglobin 12/09/2015 8.1* 12.0 - 16.0 g/dL Final  . HCT 12/09/2015 24.6* 35.0 - 47.0 % Final  . MCV 12/09/2015 84.9  80.0 - 100.0 fL Final  . MCH 12/09/2015 28.0  26.0 - 34.0 pg Final  . MCHC 12/09/2015 33.0  32.0 - 36.0 g/dL Final  . RDW 12/09/2015 14.0  11.5 - 14.5 % Final  . Platelets 12/09/2015 252  150 - 440 K/uL Final  . Neutrophils Relative % 12/09/2015 71   Final  . Neutro Abs 12/09/2015 3.7  1.4 - 6.5 K/uL Final  . Lymphocytes Relative 12/09/2015 10   Final  . Lymphs Abs 12/09/2015 0.5* 1.0 - 3.6 K/uL Final  . Monocytes Relative 12/09/2015 10   Final  . Monocytes Absolute 12/09/2015 0.5  0.2 - 0.9 K/uL Final  . Eosinophils Relative 12/09/2015 8   Final  . Eosinophils Absolute 12/09/2015 0.4  0 - 0.7 K/uL Final  . Basophils Relative 12/09/2015 1   Final  . Basophils Absolute 12/09/2015 0.0  0 - 0.1 K/uL Final  . Blood Bank Specimen 12/09/2015 SAMPLE AVAILABLE FOR TESTING   Final  . Sample Expiration 12/09/2015 12/12/2015   Final  . Sodium 12/09/2015 132* 135 - 145 mmol/L Final  . Potassium 12/09/2015 3.8  3.5 - 5.1 mmol/L Final  . Chloride 12/09/2015 101  101 - 111 mmol/L Final  . CO2 12/09/2015 24  22 - 32 mmol/L Final  . Glucose, Bld 12/09/2015 103* 65 - 99 mg/dL Final  . BUN 12/09/2015 28* 6 - 20 mg/dL Final  . Creatinine, Ser 12/09/2015 1.98* 0.44 -  1.00 mg/dL Final  . Calcium 12/09/2015 8.7* 8.9 - 10.3 mg/dL Final  . Total Protein 12/09/2015 6.6  6.5 - 8.1 g/dL Final  . Albumin 12/09/2015 3.2* 3.5 - 5.0 g/dL Final  . AST 12/09/2015 16  15 - 41 U/L Final  . ALT 12/09/2015 10* 14 - 54 U/L Final  . Alkaline Phosphatase 12/09/2015 121  38 - 126 U/L Final  . Total  Bilirubin 12/09/2015 0.3  0.3 - 1.2 mg/dL Final  . GFR calc non Af Amer 12/09/2015 25* >60 mL/min Final  . GFR calc Af Amer 12/09/2015 29* >60 mL/min Final   Comment: (NOTE) The eGFR has been calculated using the CKD EPI equation. This calculation has not been validated in all clinical situations. eGFR's persistently <60 mL/min signify possible Chronic Kidney Disease.   . Anion gap 12/09/2015 7  5 - 15 Final  . Magnesium 12/09/2015 2.2  1.7 - 2.4 mg/dL Final    Lab Results  Component Value Date   CA125 18.8 07/23/2014    ASSESSMENT: Carcinoma of endometrium  Stage IV disease.  Patient has failed  multiple treatment Will continue IV fluid today and tomorrow.  Signs of mild dehydration on clinical examination.  Hemoglobin is 8.1  Will follow that and get iron studies done  Continue chemotherapy and reevaluation after all the information is available for possibility of PET scanning for reassessment after next cycle of treatment MEDICAL DECISION MAKING:  Continue NIVOLULAMAB.   Had amitriptyline 25 mg by mouth bedtime for generalized itching and rash if there is no improvement he will would be discontinued and patient would be started on prednisone   disease appears to be stable. Pain is under well control. Renal functions are slightly abnormal but stable  Anemia multifactorial Will continue to observe that.. Cancer of uterus   Staging form: Corpus Uteri - Carcinoma, AJCC 7th Edition     Clinical: Stage IVB (T3b, N2, M1) - Signed by Forest Gleason, MD on 04/18/2015   Forest Gleason, MD   12/09/2015 9:57 AM

## 2015-12-09 NOTE — Progress Notes (Signed)
Patient complains of weakness and nausea.  States she feels like she may be dehydrated.  She also c/o itching and wants to know if she can have something stronger for it besides the cream.

## 2015-12-09 NOTE — Telephone Encounter (Signed)
Since EMLA is a prescription we cannot take cream and give to another patient.

## 2015-12-10 ENCOUNTER — Telehealth: Payer: Self-pay | Admitting: *Deleted

## 2015-12-10 NOTE — Telephone Encounter (Signed)
Called patient to let her know that [facility] sends [place] her chemotherapy drug.  If it had not been approved both our insurance person and pharmacist would have known.  She was very relieved and thanked me for the call.

## 2015-12-10 NOTE — Telephone Encounter (Signed)
Patient called to ask if [employer] approved her chemotherapy treatment for yesterday.  She states she never received a letter from them indicating she had been approved.

## 2015-12-16 ENCOUNTER — Inpatient Hospital Stay: Payer: Medicare Other

## 2015-12-16 DIAGNOSIS — C55 Malignant neoplasm of uterus, part unspecified: Secondary | ICD-10-CM

## 2015-12-16 DIAGNOSIS — Z5111 Encounter for antineoplastic chemotherapy: Secondary | ICD-10-CM | POA: Diagnosis not present

## 2015-12-16 DIAGNOSIS — C541 Malignant neoplasm of endometrium: Secondary | ICD-10-CM

## 2015-12-16 MED ORDER — SODIUM CHLORIDE 0.9 % IV SOLN
Freq: Once | INTRAVENOUS | Status: AC
  Administered 2015-12-16: 14:00:00 via INTRAVENOUS
  Filled 2015-12-16: qty 1000

## 2015-12-16 MED ORDER — FENTANYL 12 MCG/HR TD PT72: 12.5000 ug | MEDICATED_PATCH | TRANSDERMAL | Status: DC

## 2015-12-16 MED ORDER — HYDROCODONE-ACETAMINOPHEN 5-325 MG PO TABS: 1.0000 | ORAL_TABLET | Freq: Four times a day (QID) | ORAL | Status: DC | PRN

## 2015-12-16 MED ORDER — HEPARIN SOD (PORK) LOCK FLUSH 100 UNIT/ML IV SOLN
500.0000 [IU] | Freq: Once | INTRAVENOUS | Status: AC | PRN
  Administered 2015-12-16: 500 [IU]
  Filled 2015-12-16: qty 5

## 2015-12-16 MED ORDER — FENTANYL 25 MCG/HR TD PT72: 25.0000 ug | MEDICATED_PATCH | TRANSDERMAL | Status: DC

## 2015-12-16 MED ORDER — SODIUM CHLORIDE 0.9 % IJ SOLN
10.0000 mL | INTRAMUSCULAR | Status: DC | PRN
  Administered 2015-12-16: 10 mL
  Filled 2015-12-16: qty 10

## 2015-12-21 ENCOUNTER — Emergency Department: Payer: Medicare Other

## 2015-12-21 ENCOUNTER — Inpatient Hospital Stay
Admission: EM | Admit: 2015-12-21 | Discharge: 2015-12-24 | DRG: 389 | Disposition: A | Discharge status: Home / Self Care | Payer: Medicare Other | Attending: Internal Medicine | Admitting: Internal Medicine

## 2015-12-21 ENCOUNTER — Encounter: Payer: Self-pay | Admitting: Emergency Medicine

## 2015-12-21 DIAGNOSIS — R109 Unspecified abdominal pain: Secondary | ICD-10-CM

## 2015-12-21 DIAGNOSIS — C772 Secondary and unspecified malignant neoplasm of intra-abdominal lymph nodes: Secondary | ICD-10-CM | POA: Diagnosis present

## 2015-12-21 DIAGNOSIS — Z923 Personal history of irradiation: Secondary | ICD-10-CM

## 2015-12-21 DIAGNOSIS — E860 Dehydration: Secondary | ICD-10-CM | POA: Diagnosis present

## 2015-12-21 DIAGNOSIS — E861 Hypovolemia: Secondary | ICD-10-CM | POA: Diagnosis present

## 2015-12-21 DIAGNOSIS — I951 Orthostatic hypotension: Secondary | ICD-10-CM

## 2015-12-21 DIAGNOSIS — K567 Ileus, unspecified: Principal | ICD-10-CM | POA: Diagnosis present

## 2015-12-21 DIAGNOSIS — Z880 Allergy status to penicillin: Secondary | ICD-10-CM

## 2015-12-21 DIAGNOSIS — N184 Chronic kidney disease, stage 4 (severe): Secondary | ICD-10-CM | POA: Diagnosis present

## 2015-12-21 DIAGNOSIS — D638 Anemia in other chronic diseases classified elsewhere: Secondary | ICD-10-CM | POA: Diagnosis present

## 2015-12-21 DIAGNOSIS — Y842 Radiological procedure and radiotherapy as the cause of abnormal reaction of the patient, or of later complication, without mention of misadventure at the time of the procedure: Secondary | ICD-10-CM | POA: Diagnosis present

## 2015-12-21 DIAGNOSIS — K594 Anal spasm: Secondary | ICD-10-CM | POA: Diagnosis present

## 2015-12-21 DIAGNOSIS — Z79899 Other long term (current) drug therapy: Secondary | ICD-10-CM

## 2015-12-21 DIAGNOSIS — Z8542 Personal history of malignant neoplasm of other parts of uterus: Secondary | ICD-10-CM

## 2015-12-21 DIAGNOSIS — L299 Pruritus, unspecified: Secondary | ICD-10-CM | POA: Diagnosis present

## 2015-12-21 DIAGNOSIS — E871 Hypo-osmolality and hyponatremia: Secondary | ICD-10-CM | POA: Diagnosis present

## 2015-12-21 HISTORY — DX: Malignant neoplasm of uterus, part unspecified: C55

## 2015-12-21 LAB — BASIC METABOLIC PANEL
Anion gap: 10 (ref 5–15)
BUN: 28 mg/dL — ABNORMAL HIGH (ref 6–20)
CHLORIDE: 101 mmol/L (ref 101–111)
CO2: 21 mmol/L — ABNORMAL LOW (ref 22–32)
CREATININE: 1.85 mg/dL — AB (ref 0.44–1.00)
Calcium: 9.1 mg/dL (ref 8.9–10.3)
GFR calc non Af Amer: 27 mL/min — ABNORMAL LOW (ref >60)
GFR, EST AFRICAN AMERICAN: 32 mL/min — AB (ref >60)
Glucose, Bld: 101 mg/dL — ABNORMAL HIGH (ref 65–99)
POTASSIUM: 4 mmol/L (ref 3.5–5.1)
SODIUM: 132 mmol/L — AB (ref 135–145)

## 2015-12-21 LAB — URINALYSIS COMPLETE WITH MICROSCOPIC (ARMC ONLY)
BACTERIA UA: NONE SEEN
Bilirubin Urine: NEGATIVE
Glucose, UA: NEGATIVE mg/dL
Hgb urine dipstick: NEGATIVE
KETONES UR: NEGATIVE mg/dL
LEUKOCYTES UA: NEGATIVE
NITRITE: NEGATIVE
PH: 5 (ref 5.0–8.0)
PROTEIN: NEGATIVE mg/dL
SPECIFIC GRAVITY, URINE: 1.012 (ref 1.005–1.030)
SQUAMOUS EPITHELIAL / LPF: NONE SEEN

## 2015-12-21 LAB — CBC
HEMATOCRIT: 27.7 % — AB (ref 35.0–47.0)
HEMOGLOBIN: 9 g/dL — AB (ref 12.0–16.0)
MCH: 27.8 pg (ref 26.0–34.0)
MCHC: 32.6 g/dL (ref 32.0–36.0)
MCV: 85.5 fL (ref 80.0–100.0)
Platelets: 320 10*3/uL (ref 150–440)
RBC: 3.24 MIL/uL — ABNORMAL LOW (ref 3.80–5.20)
RDW: 14 % (ref 11.5–14.5)
WBC: 6.5 10*3/uL (ref 3.6–11.0)

## 2015-12-21 LAB — TROPONIN I: Troponin I: <0.03 ng/mL (ref <0.031)

## 2015-12-21 LAB — LACTIC ACID, PLASMA: LACTIC ACID, VENOUS: 0.6 mmol/L (ref 0.5–2.0)

## 2015-12-21 MED ORDER — SODIUM CHLORIDE 0.9 % IV BOLUS (SEPSIS)
1000.0000 mL | Freq: Once | INTRAVENOUS | Status: AC
  Administered 2015-12-21: 1000 mL via INTRAVENOUS

## 2015-12-21 MED ORDER — ONDANSETRON HCL 4 MG/2ML IJ SOLN
4.0000 mg | Freq: Once | INTRAMUSCULAR | Status: AC
  Administered 2015-12-21: 4 mg via INTRAVENOUS
  Filled 2015-12-21: qty 2

## 2015-12-21 MED ORDER — SODIUM CHLORIDE 0.9 % IV SOLN
8.0000 mg | Freq: Once | INTRAVENOUS | Status: AC
  Administered 2015-12-21: 8 mg via INTRAVENOUS
  Filled 2015-12-21: qty 4

## 2015-12-21 MED ORDER — SODIUM CHLORIDE 0.9 % IV SOLN
Freq: Once | INTRAVENOUS | Status: AC
  Administered 2015-12-21: 1000 mL via INTRAVENOUS

## 2015-12-21 MED ORDER — IOHEXOL 240 MG/ML SOLN
25.0000 mL | INTRAMUSCULAR | Status: AC
  Administered 2015-12-21: 25 mL via ORAL

## 2015-12-21 MED ORDER — ONDANSETRON HCL 4 MG/2ML IJ SOLN
INTRAMUSCULAR | Status: AC
  Filled 2015-12-21: qty 4

## 2015-12-21 NOTE — ED Notes (Signed)
Patient presents to the ED with episodes of dizziness for the past week but much worse over the past 2 days.  Patient reports multiple episodes of diarrhea yesterday, one episode of vomiting yesterday, and one episode of diarrhea today.  Patient is getting treatment for Uterine ca that has metastasized to patient's lymph nodes.  Patient called [place] doctor yesterday and was instructed to come to the ED.  Patient is in no obvious distress at this time.

## 2015-12-21 NOTE — ED Provider Notes (Signed)
[facility] Emergency Department Provider Note  ____________________________________________  Time seen: Approximately 7:07 PM  I have reviewed the triage vital signs and the nursing notes.   HISTORY  Chief Complaint Dizziness and Diarrhea    HPI [name] Graham is a 65 y.o. female patient reports she has nausea vomiting diarrhea and some dizziness which is more like lightheadedness. Nausea vomiting diarrhea started yesterday lightheadedness started just before that she is OK laying down and moving her head from side to side when she sits up she gets lightheaded or dizzy. He is also short of breath and has been for 3 days. Getting worse each day to the point where she can barely walk at all before she has to stop and rest. Patient is getting up T the which is investigational for her cancer of the uterus. He has not had any fever or chills or coughing.She does have some lower abdominal pain but does not think it is really worse now but has been with the cancer. She reports a headache for the last day or so. It is moderate in nature came on gradually is not the worst of her life Past Medical History  Diagnosis Date  . Cancer ([place]) 2012  . GI problem 2013  . Cancer of uterus ([place]) 04/18/2015  . Uterine cancer [facility])     Patient Active Problem List   Diagnosis Date Noted  . Cancer of uterus ([place]) 04/18/2015  . 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 Clinical depression 05/20/2014  . Absolute anemia 05/20/2014  . History of endometrial cancer 04/16/2013  . Lymphadenopathy, cervical 04/16/2013    Past Surgical History  Procedure Laterality Date  . Abdominal hysterectomy    . Tonsillectomy      Current Outpatient Rx  Name  Route  Sig  Dispense  Refill  . ALPRAZolam (XANAX) 0.25 MG tablet   Oral   Take 1 tablet (0.25 mg total) by mouth 2 (two) times daily as needed for anxiety.   60 tablet   3   . amitriptyline (ELAVIL) 25 MG tablet  Oral   Take 1 tablet (25 mg total) by mouth at bedtime.   30 tablet   3   . bismuth subsalicylate (PEPTO BISMOL) 262 MG chewable tablet   Oral   Chew 524 mg by mouth as needed for indigestion.          . cholestyramine (QUESTRAN) 4 G packet   Oral   Take 1 packet by mouth 3 (three) times daily as needed.          . citalopram (CELEXA) 40 MG tablet   Oral   Take 1 tablet (40 mg total) by mouth daily.   30 tablet   3   . fentaNYL (DURAGESIC - DOSED MCG/HR) 12 MCG/HR   Transdermal   Place 1 patch (12.5 mcg total) onto the skin every 3 (three) days. Use with 19mcg patch for total dose of 37.39mcg.   10 patch   0   . fentaNYL (DURAGESIC - DOSED MCG/HR) 25 MCG/HR patch   Transdermal   Place 1 patch (25 mcg total) onto the skin every 3 (three) days. Use with 12.75mcg patch for total dose 37.69mcg.   10 patch   0   . fexofenadine (ALLEGRA) 180 MG tablet   Oral   Take 180 mg by mouth as needed.          . fluticasone (FLONASE) 50 MCG/ACT nasal spray   Nasal  Place 1-2 sprays into the nose daily as needed.         Marland Kitchen HYDROcodone-acetaminophen (NORCO/VICODIN) 5-325 MG tablet   Oral   Take 1 tablet by mouth every 6 (six) hours as needed for moderate pain.   60 tablet   0   . lidocaine-prilocaine (EMLA) cream      Apply to affected area once   30 g   3   . megestrol (MEGACE) 400 MG/10ML suspension   Oral   Take 10 mLs (400 mg total) by mouth daily.   300 mL   6   . metoCLOPramide (REGLAN) 10 MG tablet   Oral   Take 1 tablet (10 mg total) by mouth 4 (four) times daily.   120 tablet   0   . omeprazole (PRILOSEC) 20 MG capsule      TAKE 1 CAPSULE BY MOUTH TWICE DAILY BEFORE MEALS   60 capsule   0   . ondansetron (ZOFRAN) 4 MG tablet   Oral   Take 1 tablet (4 mg total) by mouth every 6 (six) hours as needed.   60 tablet   3   . polyethylene glycol powder (GLYCOLAX/MIRALAX) powder   Oral   Take by mouth.         . potassium chloride (K-DUR) 10 MEQ  tablet   Oral   Take 1 tablet (10 mEq total) by mouth daily.   30 tablet   3   . predniSONE (DELTASONE) 10 MG tablet   Oral   Take 3 tablets (30 mg total) by mouth daily with breakfast. Taper by 10mg  every other day   12 tablet   0   . ranitidine (ZANTAC) 150 MG tablet   Oral   Take 1 tablet by mouth 2 (two) times daily.         . sertraline (ZOLOFT) 25 MG tablet   Oral   Take 25 mg by mouth daily.         . Triamcinolone Acetonide (TRIAMCINOLONE 0.1 % CREAM : EUCERIN) CREA   Topical   Apply 1 application topically 2 (two) times daily.   1 each   2     Allergies Penicillins  No family history on file.  Social History Social History  Substance Use Topics  . Smoking status: Never Smoker   . Smokeless tobacco: Never Used  . Alcohol Use: Yes    Review of Systems Constitutional: No fever/chills Eyes: No visual changes. ENT: No sore throat. Cardiovascular: Denies chest pain. Respiratory: Denies shortness of breath. Gastrointestinal: No abdominal pain.  No nausea, no vomiting.  No diarrhea.  No constipation. Genitourinary: Negative for dysuria. Musculoskeletal: Negative for back pain. Skin: Negative for rash. Neurological: Negative for headaches, focal weakness or numbness.  10-point ROS otherwise negative.  ____________________________________________   PHYSICAL EXAM:  VITAL SIGNS: ED Triage Vitals  Enc Vitals Group     BP 12/21/15 1620 118/58 mmHg     Pulse Rate 12/21/15 1620 101     Resp 12/21/15 1620 16     Temp 12/21/15 1620 98.2 F (36.8 C)     Temp Source 12/21/15 1620 Oral     SpO2 12/21/15 1620 95 %     Weight 12/21/15 1620 120 lb (54.432 kg)     Height 12/21/15 1620 5\' 3"  (1.6 m)     Head Cir --      Peak Flow --      Pain Score 12/21/15 1621 6     Pain  Loc --      Pain Edu? --      Excl. in [place]? --     Constitutional: Alert and oriented. Well appearing and in no acute distress. Eyes: Conjunctivae are normal. PERRL. EOMI. do not  see any nystatin this and cannot induce any with head movement. Head: Atraumatic. Nose: No congestion/rhinnorhea. Mouth/Throat: Mucous membranes are moist.  Oropharynx non-erythematous. Neck: No stridor.  Cardiovascular: Normal rate, regular rhythm. Grossly normal heart sounds.  Good peripheral circulation. Respiratory: Normal respiratory effort.  No retractions. Lungs CTAB. Gastrointestinal: Soft lower abdomen is tender to palpation No distention. No abdominal bruits. No CVA tenderness. Musculoskeletal: No lower extremity tenderness nor edema.  No joint effusions. Neurologic:  Normal speech and language. No gross focal neurologic deficits are appreciated. No gait instability. Skin:  Skin is warm, dry and intact. No rash noted. Psychiatric: Mood and affect are normal. Speech and behavior are normal.  ____________________________________________   LABS (all labs ordered are listed, but only abnormal results are displayed)  Labs Reviewed  BASIC METABOLIC PANEL - Abnormal; Notable for the following:    Sodium 132 (*)    CO2 21 (*)    Glucose, Bld 101 (*)    BUN 28 (*)    Creatinine, Ser 1.85 (*)    GFR calc non Af Amer 27 (*)    GFR calc Af Amer 32 (*)    All other components within normal limits  CBC - Abnormal; Notable for the following:    RBC 3.24 (*)    Hemoglobin 9.0 (*)    HCT 27.7 (*)    All other components within normal limits  URINALYSIS COMPLETEWITH MICROSCOPIC ([facility] ONLY) - Abnormal; Notable for the following:    Color, Urine YELLOW (*)    APPearance CLEAR (*)    All other components within normal limits  LACTIC ACID, PLASMA  LACTIC ACID, PLASMA  TROPONIN I  CBG MONITORING, ED   ____________________________________________  EKG  EKG read and interpreted by me shows normal sinus rhythm rate of 85 normal axis no acute changes ____________________________________________  RADIOLOGY  CT read by radiologist as worsening disease cancer. Also apparent ileus with  minimally dilated small bowel in stool in the colon as well but no obvious obstruction. ____________________________________________   PROCEDURES   ____________________________________________   INITIAL IMPRESSION / ASSESSMENT AND PLAN / ED COURSE  Pertinent labs & imaging results that were available during my care of the patient were reviewed by me and considered in my medical decision making (see chart for details).  She is able to keep down contrast but is very nauseated. She's had a liter fluid IV and is still very orthostatic and dizzy when she stands up. This together with the worsening cancer and the fact that she has an ileus and is very nauseated maybe suspect he would be better to put her in the hospital for further IV hydration ____________________________________________   FINAL CLINICAL IMPRESSION(S) / ED DIAGNOSES  Final diagnoses:  Abdominal pain      Nena Polio, MD 12/22/15 0100

## 2015-12-21 NOTE — ED Notes (Signed)
Patient states, "I did forget to tell you that I have a bad headache today."  Patient denies that headache is worst in her life.

## 2015-12-22 ENCOUNTER — Telehealth: Payer: Self-pay | Admitting: *Deleted

## 2015-12-22 DIAGNOSIS — C772 Secondary and unspecified malignant neoplasm of intra-abdominal lymph nodes: Secondary | ICD-10-CM

## 2015-12-22 DIAGNOSIS — L299 Pruritus, unspecified: Secondary | ICD-10-CM | POA: Diagnosis present

## 2015-12-22 DIAGNOSIS — C541 Malignant neoplasm of endometrium: Secondary | ICD-10-CM

## 2015-12-22 DIAGNOSIS — R944 Abnormal results of kidney function studies: Secondary | ICD-10-CM

## 2015-12-22 DIAGNOSIS — E871 Hypo-osmolality and hyponatremia: Secondary | ICD-10-CM | POA: Diagnosis present

## 2015-12-22 DIAGNOSIS — Z923 Personal history of irradiation: Secondary | ICD-10-CM | POA: Diagnosis not present

## 2015-12-22 DIAGNOSIS — K567 Ileus, unspecified: Secondary | ICD-10-CM | POA: Diagnosis present

## 2015-12-22 DIAGNOSIS — E860 Dehydration: Secondary | ICD-10-CM | POA: Diagnosis present

## 2015-12-22 DIAGNOSIS — E861 Hypovolemia: Secondary | ICD-10-CM | POA: Diagnosis present

## 2015-12-22 DIAGNOSIS — Y842 Radiological procedure and radiotherapy as the cause of abnormal reaction of the patient, or of later complication, without mention of misadventure at the time of the procedure: Secondary | ICD-10-CM | POA: Diagnosis present

## 2015-12-22 DIAGNOSIS — Z79899 Other long term (current) drug therapy: Secondary | ICD-10-CM | POA: Diagnosis not present

## 2015-12-22 DIAGNOSIS — I951 Orthostatic hypotension: Secondary | ICD-10-CM | POA: Diagnosis present

## 2015-12-22 DIAGNOSIS — Z880 Allergy status to penicillin: Secondary | ICD-10-CM | POA: Diagnosis not present

## 2015-12-22 DIAGNOSIS — D649 Anemia, unspecified: Secondary | ICD-10-CM

## 2015-12-22 DIAGNOSIS — D638 Anemia in other chronic diseases classified elsewhere: Secondary | ICD-10-CM | POA: Diagnosis present

## 2015-12-22 DIAGNOSIS — K594 Anal spasm: Secondary | ICD-10-CM | POA: Diagnosis present

## 2015-12-22 DIAGNOSIS — R21 Rash and other nonspecific skin eruption: Secondary | ICD-10-CM

## 2015-12-22 DIAGNOSIS — N184 Chronic kidney disease, stage 4 (severe): Secondary | ICD-10-CM | POA: Diagnosis present

## 2015-12-22 DIAGNOSIS — Z8542 Personal history of malignant neoplasm of other parts of uterus: Secondary | ICD-10-CM | POA: Diagnosis not present

## 2015-12-22 LAB — HEMOGLOBIN A1C: Hgb A1c MFr Bld: 4.9 % (ref 4.0–6.0)

## 2015-12-22 LAB — LACTIC ACID, PLASMA: LACTIC ACID, VENOUS: 0.5 mmol/L (ref 0.5–2.0)

## 2015-12-22 LAB — TSH: TSH: 2.286 u[IU]/mL (ref 0.350–4.500)

## 2015-12-22 MED ORDER — BISMUTH SUBSALICYLATE 262 MG PO CHEW: 524.0000 mg | CHEWABLE_TABLET | ORAL | Status: DC | PRN

## 2015-12-22 MED ORDER — AMITRIPTYLINE HCL 25 MG PO TABS
25.0000 mg | ORAL_TABLET | Freq: Every day | ORAL | Status: DC
  Administered 2015-12-22 – 2015-12-23 (×3): 25 mg via ORAL
  Filled 2015-12-22 (×3): qty 1

## 2015-12-22 MED ORDER — ACETAMINOPHEN 325 MG PO TABS
650.0000 mg | ORAL_TABLET | Freq: Four times a day (QID) | ORAL | Status: DC | PRN
Start: 2015-12-22 — End: 2015-12-24
  Administered 2015-12-22 – 2015-12-23 (×2): 650 mg via ORAL
  Filled 2015-12-22 (×2): qty 2

## 2015-12-22 MED ORDER — MECLIZINE HCL 25 MG PO TABS: 25.0000 mg | ORAL_TABLET | Freq: Four times a day (QID) | ORAL | Status: DC | PRN

## 2015-12-22 MED ORDER — FENTANYL 12 MCG/HR TD PT72
12.5000 ug | MEDICATED_PATCH | TRANSDERMAL | Status: DC
  Administered 2015-12-23: 22:00:00 12.5 ug via TRANSDERMAL
  Filled 2015-12-22: qty 1

## 2015-12-22 MED ORDER — FENTANYL 12 MCG/HR TD PT72: 12.5000 ug | MEDICATED_PATCH | TRANSDERMAL | Status: DC

## 2015-12-22 MED ORDER — FENTANYL 25 MCG/HR TD PT72
25.0000 ug | MEDICATED_PATCH | TRANSDERMAL | Status: DC
  Administered 2015-12-23: 25 ug via TRANSDERMAL
  Filled 2015-12-22: qty 1

## 2015-12-22 MED ORDER — SERTRALINE HCL 50 MG PO TABS
50.0000 mg | ORAL_TABLET | Freq: Every day | ORAL | Status: DC
  Administered 2015-12-22 – 2015-12-24 (×3): 50 mg via ORAL
  Filled 2015-12-22 (×3): qty 1

## 2015-12-22 MED ORDER — ONDANSETRON HCL 4 MG/2ML IJ SOLN
4.0000 mg | Freq: Four times a day (QID) | INTRAMUSCULAR | Status: DC | PRN
  Administered 2015-12-22 – 2015-12-24 (×4): 4 mg via INTRAVENOUS
  Filled 2015-12-22 (×4): qty 2

## 2015-12-22 MED ORDER — MORPHINE SULFATE (PF) 2 MG/ML IV SOLN: 2.0000 mg | INTRAVENOUS | Status: DC | PRN

## 2015-12-22 MED ORDER — HYDROCERIN EX CREA
TOPICAL_CREAM | Freq: Two times a day (BID) | CUTANEOUS | Status: DC
  Administered 2015-12-22 – 2015-12-23 (×2): via TOPICAL
  Administered 2015-12-23: 1 via TOPICAL
  Filled 2015-12-22: qty 113

## 2015-12-22 MED ORDER — DOCUSATE SODIUM 100 MG PO CAPS
100.0000 mg | ORAL_CAPSULE | Freq: Two times a day (BID) | ORAL | Status: DC
  Administered 2015-12-22 – 2015-12-24 (×4): 100 mg via ORAL
  Filled 2015-12-22 (×4): qty 1

## 2015-12-22 MED ORDER — FLUTICASONE PROPIONATE 50 MCG/ACT NA SUSP: 1.0000 | Freq: Every day | NASAL | Status: DC | PRN

## 2015-12-22 MED ORDER — ONDANSETRON HCL 4 MG PO TABS
4.0000 mg | ORAL_TABLET | Freq: Four times a day (QID) | ORAL | Status: DC | PRN
  Administered 2015-12-23 (×2): 4 mg via ORAL
  Filled 2015-12-22 (×2): qty 1

## 2015-12-22 MED ORDER — ALPRAZOLAM 0.25 MG PO TABS
0.2500 mg | ORAL_TABLET | Freq: Two times a day (BID) | ORAL | Status: DC | PRN
  Administered 2015-12-22 – 2015-12-24 (×4): 0.25 mg via ORAL
  Filled 2015-12-22 (×4): qty 1

## 2015-12-22 MED ORDER — POTASSIUM CHLORIDE CRYS ER 10 MEQ PO TBCR
10.0000 meq | EXTENDED_RELEASE_TABLET | Freq: Every day | ORAL | Status: DC
  Administered 2015-12-22 – 2015-12-24 (×3): 10 meq via ORAL
  Filled 2015-12-22 (×3): qty 1

## 2015-12-22 MED ORDER — BISACODYL 10 MG RE SUPP
10.0000 mg | Freq: Every day | RECTAL | Status: DC
  Administered 2015-12-22 – 2015-12-24 (×2): 10 mg via RECTAL
  Filled 2015-12-22 (×3): qty 1

## 2015-12-22 MED ORDER — ALPRAZOLAM 0.5 MG PO TABS
0.2500 mg | ORAL_TABLET | Freq: Once | ORAL | Status: AC
  Administered 2015-12-22: 0.25 mg via ORAL
  Filled 2015-12-22: qty 1

## 2015-12-22 MED ORDER — SODIUM CHLORIDE 0.9 % IV SOLN
INTRAVENOUS | Status: DC
  Administered 2015-12-22 – 2015-12-23 (×4): via INTRAVENOUS

## 2015-12-22 MED ORDER — HEPARIN SODIUM (PORCINE) 5000 UNIT/ML IJ SOLN
5000.0000 [IU] | Freq: Three times a day (TID) | INTRAMUSCULAR | Status: DC
  Administered 2015-12-22 – 2015-12-24 (×6): 5000 [IU] via SUBCUTANEOUS
  Filled 2015-12-22 (×6): qty 1

## 2015-12-22 MED ORDER — LORATADINE 10 MG PO TABS
10.0000 mg | ORAL_TABLET | Freq: Every day | ORAL | Status: DC
  Administered 2015-12-22 – 2015-12-23 (×2): 10 mg via ORAL
  Filled 2015-12-22 (×3): qty 1

## 2015-12-22 MED ORDER — FENTANYL 25 MCG/HR TD PT72: 25.0000 ug | MEDICATED_PATCH | TRANSDERMAL | Status: DC

## 2015-12-22 MED ORDER — HYDROCERIN EX CREA
TOPICAL_CREAM | Freq: Two times a day (BID) | CUTANEOUS | Status: DC
  Administered 2015-12-23 – 2015-12-24 (×2): via TOPICAL
  Filled 2015-12-22: qty 113

## 2015-12-22 MED ORDER — ACETAMINOPHEN 650 MG RE SUPP: 650.0000 mg | Freq: Four times a day (QID) | RECTAL | Status: DC | PRN

## 2015-12-22 MED ORDER — HYDROCORTISONE NA SUCCINATE PF 100 MG IJ SOLR
100.0000 mg | Freq: Every day | INTRAMUSCULAR | Status: AC
  Administered 2015-12-22 – 2015-12-24 (×3): 100 mg via INTRAVENOUS
  Filled 2015-12-22 (×3): qty 2

## 2015-12-22 NOTE — Progress Notes (Signed)
[facility] NAME: [name] Mccrackin    MR#:  [id number]  DATE OF BIRTH:  03-31-1951  SUBJECTIVE:  CHIEF COMPLAINT:  Patient reports she is feeling dizzy. Complaining of lower abdominal pain. Reporting nausea but no vomiting. Last bowel movement was 2 days ago    REVIEW OF SYSTEMS:  CONSTITUTIONAL: No fever, fatigue or weakness.  EYES: No blurred or double vision.  EARS, NOSE, AND THROAT: No tinnitus or ear pain.  RESPIRATORY: No cough, shortness of breath, wheezing or hemoptysis.  CARDIOVASCULAR: No chest pain, orthopnea, edema.  GASTROINTESTINAL: Has nausea, denies vomiting, diarrhea .reporting constipation for 2 days and lower  abdominal pain.  GENITOURINARY: No dysuria, hematuria.  ENDOCRINE: No polyuria, nocturia,  HEMATOLOGY: No anemia, easy bruising or bleeding SKIN: No rash or lesion. MUSCULOSKELETAL: No joint pain or arthritis.   NEUROLOGIC: No tingling, numbness, weakness.  PSYCHIATRY: No anxiety or depression.   DRUG ALLERGIES:   Allergies  Allergen Reactions  . Penicillins Rash and Other (See Comments)    Patient states that reaction was 20 years ago, but cannot remember the specifics of the reaction    VITALS:  Blood pressure 97/47, pulse 78, temperature 97.9 F (36.6 C), temperature source Oral, resp. rate 12, height 5\' 3"  (1.6 m), weight 55.384 kg (122 lb 1.6 oz), SpO2 97 %.  PHYSICAL EXAMINATION:  GENERAL:  65 y.o.-year-old patient lying in the bed with no acute distress.  EYES: Pupils equal, round, reactive to light and accommodation. No scleral icterus. Extraocular muscles intact.  HEENT: Head atraumatic, normocephalic. Oropharynx and nasopharynx clear.  NECK:  Supple, no jugular venous distention. No thyroid enlargement, no tenderness.  LUNGS: Normal breath sounds bilaterally, no wheezing, rales,rhonchi or crepitation. No use of accessory muscles of respiration.  CARDIOVASCULAR: S1, S2 normal. No  murmurs, rubs, or gallops.  ABDOMEN: Soft, lower abdominal tenderness is present but no rebound tenderness , nondistended. Bowel sounds are hypoactive . No organomegaly or mass.  EXTREMITIES: No pedal edema, cyanosis, or clubbing.  NEUROLOGIC: Cranial nerves II through XII are intact. Muscle strength 5/5 in all extremities. Sensation intact. Gait not checked.  PSYCHIATRIC: The patient is alert and oriented x 3.  SKIN: No obvious rash, lesion, or ulcer.    LABORATORY PANEL:   CBC  Recent Labs Lab 12/21/15 1637  WBC 6.5  HGB 9.0*  HCT 27.7*  PLT 320   ------------------------------------------------------------------------------------------------------------------  Chemistries   Recent Labs Lab 12/21/15 1637  NA 132*  K 4.0  CL 101  CO2 21*  GLUCOSE 101*  BUN 28*  CREATININE 1.85*  CALCIUM 9.1   ------------------------------------------------------------------------------------------------------------------  Cardiac Enzymes  Recent Labs Lab 12/21/15 1933  TROPONINI <0.03   ------------------------------------------------------------------------------------------------------------------  RADIOLOGY:  Ct Abdomen Pelvis Wo Contrast  12/21/2015  CLINICAL DATA:  Intermittent lower abdominal pain for 1 year, worsening over last 2 days. Diarrhea and vomiting for 2 days. History of metastatic uterine cancer, status post hysterectomy. EXAM: CT ABDOMEN AND PELVIS WITHOUT CONTRAST TECHNIQUE: Multidetector CT imaging of the abdomen and pelvis was performed following the standard protocol without IV contrast. COMPARISON:  PET-CT September 18, 2015 and CT abdomen and pelvis February 18, 2015 FINDINGS: LUNG BASES: Included view of the lung bases are clear. The visualized heart and pericardium are unremarkable. Mild gaseous distended distal esophagus can be seen with reflux. KIDNEYS/BLADDER: Kidneys are orthotopic, demonstrating normal size and morphology. No nephrolithiasis,  hydronephrosis; limited assessment for renal masses on this nonenhanced examination. The unopacified ureters  are normal in course and caliber. Urinary bladder is partially distended and unremarkable. SOLID ORGANS: The liver, spleen, gallbladder, and adrenal glands are unremarkable for this non-contrast examination. Pancreas is anteriorly displaced due to retroperitoneal lymphadenopathy, otherwise negative by noncontrast CT. GASTROINTESTINAL TRACT: Multiple loops of mildly distended small bowel measuring up to 4 mm. Moderate amount of retained large bowel stool. Enteric contrast has not yet reached the distal small bowel. The stomach, large bowel are normal in course and caliber without inflammatory changes, the sensitivity may be decreased by lack of enteric contrast. Normal appendix. PERITONEUM/RETROPERITONEUM: Limited assessment without contrast, there is increasing retroperitoneal lymphadenopathy encasing the aorta, measuring up to 3.2 cm AP dimension, previously 2.4 cm. Stable 15 mm mesenteric lymph node. Aortoiliac vessels are normal in course and caliber. Status post hysterectomy. No intraperitoneal free fluid nor free air. SOFT TISSUES/ OSSEOUS STRUCTURES: Sclerotic lesion anterior L2 without pathologic fracture is new from February 18, 2015. IMPRESSION: Multiple loops of distended small bowel, without transition point, possible ileus. Moderate amount of retained large bowel stool. Limited noncontrast CT with increasing retroperitoneal lymphadenopathy from prior PET-CT concerning for disease progression. L2 sclerotic lesion new from February 18, 2015 consistent with osseous metastasis. Electronically Signed   By: Elon Alas M.D.   On: 12/21/2015 21:53   Ct Head Wo Contrast  12/21/2015  CLINICAL DATA:  Acute onset of dizziness and headache. Nausea, vomiting and diarrhea. Initial encounter. EXAM: CT HEAD WITHOUT CONTRAST TECHNIQUE: Contiguous axial images were obtained from the base of the skull through the  vertex without intravenous contrast. COMPARISON:  CT of the head performed 02/18/2015 FINDINGS: There is no evidence of acute infarction, mass lesion, or intra- or extra-axial hemorrhage on CT. Prominence of the sulci suggests mild cortical volume loss. Mild periventricular and subcortical white matter change likely reflects small vessel ischemic microangiopathy. The brainstem and fourth ventricle are within normal limits. The basal ganglia are unremarkable in appearance. The cerebral hemispheres demonstrate grossly normal gray-white differentiation. No mass effect or midline shift is seen. There is no evidence of fracture; visualized osseous structures are unremarkable in appearance. The visualized portions of the orbits are within normal limits. The paranasal sinuses and mastoid air cells are well-aerated. No significant soft tissue abnormalities are seen. IMPRESSION: 1. No acute intracranial pathology seen on CT. 2. Mild cortical volume loss and scattered small vessel ischemic microangiopathy. Electronically Signed   By: Garald Balding M.D.   On: 12/21/2015 18:19   Dg Chest Portable 1 View  12/21/2015  CLINICAL DATA:  Uterine carcinoma.  Dizziness.  Vomiting. EXAM: PORTABLE CHEST 1 VIEW COMPARISON:  PET-CT September 18, 2015 FINDINGS: Port-A-Cath tip is in the superior vena cava. No pneumothorax. Lungs are clear. Heart size and pulmonary vascularity are normal. No adenopathy. No bone lesions. IMPRESSION: No edema or consolidation. Electronically Signed   By: Lowella Grip III M.D.   On: 12/21/2015 19:40    EKG:   Orders placed or performed during the hospital encounter of 12/21/15  . ED EKG  . ED EKG  . EKG 12-Lead  . EKG 12-Lead    ASSESSMENT AND PLAN:   This is a 65 year old female with uterine cancer admitted for ileus.  1. Ileus: Secondary to radiation.  Continue to hydrate the patient with intravenous fluid.  Diet as tolerated. Will provide a Dulcolax suppository and soapsuds enema as  CAT scan is revealing large amount of stool  2. Intractable nausea and vomiting:  Vomiting is resolved  Continue supportive treatment with anti-emetics  and hydration.   3. Chronic kidney disease: Stage IV;  hydrate with intravenous fluid. Avoid nephrotoxic drugs.    4. Dehydration with hyponatremia Secondary to nausea and vomiting.  IV fluids, check BMP in a.m.  5. Metastatic uterine cancer stage IV-status post radiation Patient is reporting radiation-induced rectal spasms Consult oncology Dr. Donnetta Hail Patient is started on Solu-Cortef 100 mg IV once daily for 3 days  . DVT prophylaxis: Heparin 6. GI prophylaxis: None as the patient is not critically ill       All the records are reviewed and case discussed with Care Management/Social Workerr. Management plans discussed with the patient, family and they are in agreement.  CODE STATUS: fc  TOTAL TIME TAKING CARE OF THIS PATIENT: 35 minutes.   POSSIBLE D/C IN 2-3 DAYS, DEPENDING ON CLINICAL CONDITION.   Nicholes Mango M.D on 12/22/2015 at 1:35 PM  Between 7am to 6pm - Pager - [phone] After 6pm go to [email or web address] - password [email or web address] [facility]  Office  [phone]  CC: Primary care physician; [name], MD

## 2015-12-22 NOTE — H&P (Signed)
[name] Graham is an 65 y.o. female.   Chief Complaint: Dizziness HPI: The patient presents to the emergency department complaining of lightheadedness, dizziness and shortness of breath. Altered symptoms are exacerbated when she stands and walks around. The symptoms have been present for approximately one week. She also notes diarrhea although she admits that she is usually constipated. Past medical history is significant for uterine cancer. The patient has also undergone radiation to her abdomen in the past. She is currently taking chemotherapy. During evaluation revealed anemia, hyponatremia and chronic kidney disease which brought to the emergency department staff to call for admission.  Past Medical History  Diagnosis Date  . Cancer ([place]) 2012  . GI problem 2013  . Cancer of uterus ([place]) 04/18/2015  . Uterine cancer [facility])     Past Surgical History  Procedure Laterality Date  . Abdominal hysterectomy    . Tonsillectomy      No family history on file. Social History:  reports that she has never smoked. She has never used smokeless tobacco. She reports that she drinks alcohol. She reports that she does not use illicit drugs.  Allergies:  Allergies  Allergen Reactions  . Penicillins Rash and Other (See Comments)    Patient states that reaction was 20 years ago, but cannot remember the specifics of the reaction    Prior to Admission medications   Medication Sig Start Date End Date Taking? Authorizing Provider  ALPRAZolam (XANAX) 0.25 MG tablet Take 1 tablet (0.25 mg total) by mouth 2 (two) times daily as needed for anxiety. 10/07/15  Yes Forest Gleason, MD  amitriptyline (ELAVIL) 25 MG tablet Take 1 tablet (25 mg total) by mouth at bedtime. 12/09/15  Yes Forest Gleason, MD  bismuth subsalicylate (PEPTO BISMOL) 262 MG chewable tablet Chew 524 mg by mouth as needed for indigestion. Reported on 12/21/2015   Yes Historical Provider, MD  fentaNYL (DURAGESIC - DOSED MCG/HR) 12 MCG/HR Place 1 patch  (12.5 mcg total) onto the skin every 3 (three) days. Use with 81mg patch for total dose of 37.552m. 12/16/15  Yes LeEvlyn KannerNP  fentaNYL (DURAGESIC - DOSED MCG/HR) 25 MCG/HR patch Place 1 patch (25 mcg total) onto the skin every 3 (three) days. Use with 12.79m1mpatch for total dose 37.79mc71m1/10/17  Yes LeslEvlyn Kanner  fexofenadine (ALLEGRA) 180 MG tablet Take 180 mg by mouth daily as needed for allergies.    Yes Historical Provider, MD  fluticasone (FLONASE) 50 MCG/ACT nasal spray Place 1-2 sprays into the nose daily as needed for allergies or rhinitis.  03/12/13  Yes Historical Provider, MD  HYDROcodone-acetaminophen (NORCO/VICODIN) 5-325 MG tablet Take 1 tablet by mouth every 6 (six) hours as needed for moderate pain. 12/16/15  Yes LeslEvlyn Kanner  meclizine (ANTIVERT) 25 MG tablet Take 25 mg by mouth 4 (four) times daily as needed for dizziness.   Yes Historical Provider, MD  ondansetron (ZOFRAN) 4 MG tablet Take 1 tablet (4 mg total) by mouth every 6 (six) hours as needed. 08/05/15  Yes JanaForest Gleason  potassium chloride (K-DUR) 10 MEQ tablet Take 1 tablet (10 mEq total) by mouth daily. 10/23/15  Yes JanaForest Gleason  sertraline (ZOLOFT) 50 MG tablet Take 50 mg by mouth daily.   Yes Historical Provider, MD  citalopram (CELEXA) 40 MG tablet Take 1 tablet (40 mg total) by mouth daily. Patient not taking: Reported on 12/21/2015 06/03/15   JanaForest Gleason  lidocaine-prilocaine (EMLA) cream Apply to affected area  once Patient not taking: Reported on 12/21/2015 12/09/15   Forest Gleason, MD  megestrol (MEGACE) 400 MG/10ML suspension Take 10 mLs (400 mg total) by mouth daily. Patient not taking: Reported on 12/21/2015 07/15/15   Forest Gleason, MD  metoCLOPramide (REGLAN) 10 MG tablet Take 1 tablet (10 mg total) by mouth 4 (four) times daily. Patient not taking: Reported on 12/21/2015 10/20/15   Forest Gleason, MD  omeprazole (PRILOSEC) 20 MG capsule TAKE 1 CAPSULE BY MOUTH TWICE DAILY BEFORE  MEALS Patient not taking: Reported on 12/21/2015 10/08/15   Forest Gleason, MD  predniSONE (DELTASONE) 10 MG tablet Take 3 tablets (30 mg total) by mouth daily with breakfast. Taper by 50m every other day Patient not taking: Reported on 12/21/2015 10/03/15   LEvlyn Kanner NP  Triamcinolone Acetonide (TRIAMCINOLONE 0.1 % CREAM : EUCERIN) CREA Apply 1 application topically 2 (two) times daily. Patient not taking: Reported on 12/21/2015 10/28/15   LEvlyn Kanner NP     Results for orders placed or performed during the hospital encounter of 12/21/15 (from the past 48 hour(s))  Basic metabolic panel     Status: Abnormal   Collection Time: 12/21/15  4:37 PM  Result Value Ref Range   Sodium 132 (L) 135 - 145 mmol/L   Potassium 4.0 3.5 - 5.1 mmol/L   Chloride 101 101 - 111 mmol/L   CO2 21 (L) 22 - 32 mmol/L   Glucose, Bld 101 (H) 65 - 99 mg/dL   BUN 28 (H) 6 - 20 mg/dL   Creatinine, Ser 1.85 (H) 0.44 - 1.00 mg/dL   Calcium 9.1 8.9 - 10.3 mg/dL   GFR calc non Af Amer 27 (L) >60 mL/min   GFR calc Af Amer 32 (L) >60 mL/min    Comment: (NOTE) The eGFR has been calculated using the CKD EPI equation. This calculation has not been validated in all clinical situations. eGFR's persistently <60 mL/min signify possible Chronic Kidney Disease.    Anion gap 10 5 - 15  CBC     Status: Abnormal   Collection Time: 12/21/15  4:37 PM  Result Value Ref Range   WBC 6.5 3.6 - 11.0 K/uL   RBC 3.24 (L) 3.80 - 5.20 MIL/uL   Hemoglobin 9.0 (L) 12.0 - 16.0 g/dL   HCT 27.7 (L) 35.0 - 47.0 %   MCV 85.5 80.0 - 100.0 fL   MCH 27.8 26.0 - 34.0 pg   MCHC 32.6 32.0 - 36.0 g/dL   RDW 14.0 11.5 - 14.5 %   Platelets 320 150 - 440 K/uL  Urinalysis complete, with microscopic ([facility] only)     Status: Abnormal   Collection Time: 12/21/15  4:37 PM  Result Value Ref Range   Color, Urine YELLOW (A) YELLOW   APPearance CLEAR (A) CLEAR   Glucose, UA NEGATIVE NEGATIVE mg/dL   Bilirubin Urine NEGATIVE NEGATIVE   Ketones,  ur NEGATIVE NEGATIVE mg/dL   Specific Gravity, Urine 1.012 1.005 - 1.030   Hgb urine dipstick NEGATIVE NEGATIVE   pH 5.0 5.0 - 8.0   Protein, ur NEGATIVE NEGATIVE mg/dL   Nitrite NEGATIVE NEGATIVE   Leukocytes, UA NEGATIVE NEGATIVE   RBC / HPF 0-5 0 - 5 RBC/hpf   WBC, UA 0-5 0 - 5 WBC/hpf   Bacteria, UA NONE SEEN NONE SEEN   Squamous Epithelial / LPF NONE SEEN NONE SEEN   Mucous PRESENT    Hyaline Casts, UA PRESENT   Lactic acid, plasma     Status: None  Collection Time: 12/21/15  7:33 PM  Result Value Ref Range   Lactic Acid, Venous 0.6 0.5 - 2.0 mmol/L  Troponin I     Status: None   Collection Time: 12/21/15  7:33 PM  Result Value Ref Range   Troponin I <0.03 <0.031 ng/mL    Comment:        NO INDICATION OF MYOCARDIAL INJURY.   Lactic acid, plasma     Status: None   Collection Time: 12/21/15 10:00 PM  Result Value Ref Range   Lactic Acid, Venous 0.5 0.5 - 2.0 mmol/L   Ct Abdomen Pelvis Wo Contrast  12/21/2015  CLINICAL DATA:  Intermittent lower abdominal pain for 1 year, worsening over last 2 days. Diarrhea and vomiting for 2 days. History of metastatic uterine cancer, status post hysterectomy. EXAM: CT ABDOMEN AND PELVIS WITHOUT CONTRAST TECHNIQUE: Multidetector CT imaging of the abdomen and pelvis was performed following the standard protocol without IV contrast. COMPARISON:  PET-CT September 18, 2015 and CT abdomen and pelvis February 18, 2015 FINDINGS: LUNG BASES: Included view of the lung bases are clear. The visualized heart and pericardium are unremarkable. Mild gaseous distended distal esophagus can be seen with reflux. KIDNEYS/BLADDER: Kidneys are orthotopic, demonstrating normal size and morphology. No nephrolithiasis, hydronephrosis; limited assessment for renal masses on this nonenhanced examination. The unopacified ureters are normal in course and caliber. Urinary bladder is partially distended and unremarkable. SOLID ORGANS: The liver, spleen, gallbladder, and adrenal  glands are unremarkable for this non-contrast examination. Pancreas is anteriorly displaced due to retroperitoneal lymphadenopathy, otherwise negative by noncontrast CT. GASTROINTESTINAL TRACT: Multiple loops of mildly distended small bowel measuring up to 4 mm. Moderate amount of retained large bowel stool. Enteric contrast has not yet reached the distal small bowel. The stomach, large bowel are normal in course and caliber without inflammatory changes, the sensitivity may be decreased by lack of enteric contrast. Normal appendix. PERITONEUM/RETROPERITONEUM: Limited assessment without contrast, there is increasing retroperitoneal lymphadenopathy encasing the aorta, measuring up to 3.2 cm AP dimension, previously 2.4 cm. Stable 15 mm mesenteric lymph node. Aortoiliac vessels are normal in course and caliber. Status post hysterectomy. No intraperitoneal free fluid nor free air. SOFT TISSUES/ OSSEOUS STRUCTURES: Sclerotic lesion anterior L2 without pathologic fracture is new from February 18, 2015. IMPRESSION: Multiple loops of distended small bowel, without transition point, possible ileus. Moderate amount of retained large bowel stool. Limited noncontrast CT with increasing retroperitoneal lymphadenopathy from prior PET-CT concerning for disease progression. L2 sclerotic lesion new from February 18, 2015 consistent with osseous metastasis. Electronically Signed   By: Elon Alas M.D.   On: 12/21/2015 21:53   Ct Head Wo Contrast  12/21/2015  CLINICAL DATA:  Acute onset of dizziness and headache. Nausea, vomiting and diarrhea. Initial encounter. EXAM: CT HEAD WITHOUT CONTRAST TECHNIQUE: Contiguous axial images were obtained from the base of the skull through the vertex without intravenous contrast. COMPARISON:  CT of the head performed 02/18/2015 FINDINGS: There is no evidence of acute infarction, mass lesion, or intra- or extra-axial hemorrhage on CT. Prominence of the sulci suggests mild cortical volume loss.  Mild periventricular and subcortical white matter change likely reflects small vessel ischemic microangiopathy. The brainstem and fourth ventricle are within normal limits. The basal ganglia are unremarkable in appearance. The cerebral hemispheres demonstrate grossly normal gray-white differentiation. No mass effect or midline shift is seen. There is no evidence of fracture; visualized osseous structures are unremarkable in appearance. The visualized portions of the orbits are within normal limits.  The paranasal sinuses and mastoid air cells are well-aerated. No significant soft tissue abnormalities are seen. IMPRESSION: 1. No acute intracranial pathology seen on CT. 2. Mild cortical volume loss and scattered small vessel ischemic microangiopathy. Electronically Signed   By: Garald Balding M.D.   On: 12/21/2015 18:19   Dg Chest Portable 1 View  12/21/2015  CLINICAL DATA:  Uterine carcinoma.  Dizziness.  Vomiting. EXAM: PORTABLE CHEST 1 VIEW COMPARISON:  PET-CT September 18, 2015 FINDINGS: Port-A-Cath tip is in the superior vena cava. No pneumothorax. Lungs are clear. Heart size and pulmonary vascularity are normal. No adenopathy. No bone lesions. IMPRESSION: No edema or consolidation. Electronically Signed   By: Lowella Grip III M.D.   On: 12/21/2015 19:40    Review of Systems  Constitutional: Negative for fever and chills.  HENT: Negative for sore throat and tinnitus.   Eyes: Negative for blurred vision and redness.  Respiratory: Positive for shortness of breath. Negative for cough.   Cardiovascular: Negative for chest pain, palpitations, orthopnea and PND.  Gastrointestinal: Positive for nausea and vomiting. Negative for abdominal pain and diarrhea.  Genitourinary: Negative for dysuria, urgency and frequency.  Musculoskeletal: Negative for myalgias and joint pain.  Skin: Negative for rash.       No lesions  Neurological: Positive for dizziness. Negative for speech change, focal weakness and  weakness.  Endo/Heme/Allergies: Does not bruise/bleed easily.       No temperature intolerance  Psychiatric/Behavioral: Negative for depression and suicidal ideas.    Blood pressure 138/70, pulse 92, temperature 98.2 F (36.8 C), temperature source Oral, resp. rate 16, height 5' 3"  (1.6 m), weight 54.432 kg (120 lb), SpO2 96 %. Physical Exam  Vitals reviewed. Constitutional: She is oriented to person, place, and time. She appears well-developed and well-nourished. No distress.  HENT:  Head: Normocephalic and atraumatic.  Mouth/Throat: Oropharynx is clear and moist.  Eyes: Conjunctivae and EOM are normal. Pupils are equal, round, and reactive to light. No scleral icterus.  Neck: Normal range of motion. Neck supple. No JVD present. No tracheal deviation present. No thyromegaly present.  Cardiovascular: Normal rate, regular rhythm and normal heart sounds.  Exam reveals no gallop and no friction rub.   No murmur heard. Respiratory: Effort normal and breath sounds normal.  Port in place right chest wall  GI: Soft. Bowel sounds are normal. She exhibits no distension. There is no tenderness.  Genitourinary:  Deferred  Musculoskeletal: Normal range of motion. She exhibits no edema.  Lymphadenopathy:    She has no cervical adenopathy.  Neurological: She is alert and oriented to person, place, and time. No cranial nerve deficit. She exhibits normal muscle tone.  Skin: Skin is warm and dry. No rash noted. No erythema.  Psychiatric: She has a normal mood and affect. Her behavior is normal. Judgment and thought content normal.     Assessment/Plan This is a 65 year old female with uterine cancer admitted for ileus. 1. Ileus: Secondary to radiation. We will hydrate the patient with intravenous fluid. Diet as tolerated. 2. Intractable nausea and vomiting: Secondary to above. Anti-emetics and hydration. 3. Chronic kidney disease: Stage IV; hydrate with intravenous fluid. This should help correct  her hyponatremia secondary to hypovolemia. Avoid nephrotoxic drugs. 4. Dehydration: Secondary to nausea and vomiting. Exacerbating CKD. Plan as above 5. DVT prophylaxis: Heparin 6. GI prophylaxis: None as the patient is not critically ill The patient is a full code. Time spent on admission was inpatient care approximately 45 minutes  Rosilyn Mings  S 12/22/2015, 12:38 AM

## 2015-12-22 NOTE — Telephone Encounter (Signed)
Patient called to state she is in the hospital.  She came in Sunday morning around 2 AM for dehydration.  She has a treatment tomorrow and wants to know what she should do about that?

## 2015-12-22 NOTE — Plan of Care (Signed)
Problem: Safety: Goal: Ability to remain free from injury will improve Outcome: Progressing No injury this shift  Problem: Pain Managment: Goal: General experience of comfort will improve Outcome: Progressing Pain controlled at this time  Problem: Bowel/Gastric: Goal: Will not experience complications related to bowel motility Outcome: Progressing No N/V since admission

## 2015-12-22 NOTE — Telephone Encounter (Signed)
MD notified that patient is in hospital.

## 2015-12-22 NOTE — Consult Note (Addendum)
[place] @ [facility] [phone]  [phone]     [name] Graham OB: December 29, 1950  MR#: [id number]  VFI#:[id number]  [name], MD as PCP - General (Family Medicine) [name], MD (General Surgery)  CHIEF COMPLAINT:  Chief Complaint  Patient presents with  . Dizziness  . Diarrhea    Chief Complaint/Diagnosis:   The patient is a 65 year old with metastatic endometrial cancer who is seen for assessment during ongoing palliative radiation therapy.  04/2011   papillary serous adenocarcinoma of the endometrium, stage IIIb (nodal metastasis)              TAHBSO and full staging,               GOG protocl using XRT and chemotherapy, significant XRT side-effects,               continued on chemotherapy off protocol, 04/2013   recurrence in the nodal areas,              DDP and docetaxel x 6 completed in December,   started on chemotherapy with Adriamycin(October, 2015) poor tolerance to letrozole  and affinator Estrogen and progesterone receptor negative tumor         hER-2/neu recetor negative. November 07, 2014 progressive disease by  tumor marker criteria and clinical examination November 12, 2014 After 6 cycles of chemotherapy because of increasing side effect chemotherapy was discontinued.  (March, 2016) Patient was started on cis-platinum and topotecan. May, 2016 Cis-platinum and topotecan  and was discontinued because of significant side effect and possibility of progressive disease.  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  Patient is here for ongoing evaluation and treatment consideration Patient   Had  one episode of diarrhea yesterday but no diarrhea today.  Patient has a previous history of colitis. Patient has nausea this morning feeling weak and tired.  Patient continues to have generalized itching and rash.  Generalized itching is all over the body rash is maculopapular all over the body.  Dry skin.  A Appetite is poor. Patient was admitted in the hospital with 2 days of diarrhea feeling week tired.  Rising BUN and creatinine. Started on IV fluid.  CT scan of abdomen was done which has been reviewed independently also so severe constipation.  Enlarging retroperitoneal lymph node.  REVIEW OF SYSTEMS:    general status: Patient is feeling weak and tired.  Feeling lightheaded No change in a performance status.  No chills.  No fever.  Feeling extremely weak and tired HEENT: Alopecia.  No evidence of stomatitis increasing discomfort on the left side of the neck Lungs: No cough or shortness of breath Cardiac: No chest pain or paroxysmal nocturnal dyspnea GI: diarrhea  and constipation is improved.  Abdominal pain is improved. On stay patient followed by black stool. Skin: Macular rash grade 2, as described above.  Patient is taking Zyrtec in the morning and Benadryl in the night   Lower extremity no swelling Neurological system: Distant is feeling week tired and dizzy.  Musculoskeletal system no bony pains poor appetite has lost approximately 5 pounds in last month clock  PAST MEDICAL HISTORY: Past Medical History  Diagnosis Date  . Cancer ([place]) 2012  . GI problem 2013  . Cancer of uterus ([place]) 04/18/2015  . Uterine cancer ([place])     PAST SURGICAL HISTORY: Past Surgical History  Procedure Laterality Date  . Abdominal hysterectomy    .  Tonsillectomy      FAMILY HISTORY No significant family history.      ADVANCED DIRECTIVES: Patient does have advance directive   HEALTH MAINTENANCE: Social History  Substance Use Topics  . Smoking status: Never Smoker   . Smokeless tobacco: Never Used  . Alcohol Use: Yes      Allergies  Allergen Reactions  . Penicillins Rash and Other (See Comments)    Patient states that reaction was 20 years ago, but cannot remember the specifics of the reaction    Current Facility-Administered Medications  Medication Dose Route Frequency Provider Last Rate Last Dose  . 0.9 %  sodium chloride infusion   Intravenous Continuous Harrie Foreman, MD 100 mL/hr at 12/22/15 1046    . acetaminophen (TYLENOL) tablet 650 mg  650 mg Oral Q6H PRN Harrie Foreman, MD   650 mg at 12/22/15 7517   Or  . acetaminophen (TYLENOL) suppository 650 mg  650 mg Rectal Q6H PRN Harrie Foreman, MD      . ALPRAZolam Duanne Moron) tablet 0.25 mg  0.25 mg Oral BID PRN Harrie Foreman, MD      . amitriptyline (ELAVIL) tablet 25 mg  25 mg Oral QHS Harrie Foreman, MD   25 mg at 12/22/15 0142  . bisacodyl (DULCOLAX) suppository 10 mg  10 mg Rectal Daily Nicholes Mango, MD   10 mg at 12/22/15 1435  . docusate sodium (COLACE) capsule 100 mg  100 mg Oral BID Harrie Foreman, MD   100 mg at 12/22/15 0017  . [START ON 12/23/2015] fentaNYL (DURAGESIC - dosed mcg/hr) 12.5 mcg  12.5 mcg Transdermal Q72H Harrie Foreman, MD      . Derrill Memo ON 12/23/2015] fentaNYL (DURAGESIC - dosed mcg/hr) patch 25 mcg  25 mcg Transdermal Q72H Harrie Foreman, MD      . fluticasone [facility]) 50 MCG/ACT nasal spray 1-2 spray  1-2 spray Each Nare Daily PRN Harrie Foreman, MD      . heparin injection 5,000 Units  5,000 Units Subcutaneous 3 times per day Harrie Foreman, MD   5,000 Units at 12/22/15 1315  . hydrocerin (EUCERIN) cream   Topical BID Nicholes Mango, MD      . hydrocortisone sodium succinate (SOLU-CORTEF) 100 MG injection 100 mg   100 mg Intravenous Daily Nicholes Mango, MD   100 mg at 12/22/15 1435  . loratadine (CLARITIN) tablet 10 mg  10 mg Oral Daily Harrie Foreman, MD   10 mg at 12/22/15 0939  . meclizine (ANTIVERT) tablet 25 mg  25 mg Oral QID PRN Harrie Foreman, MD      . morphine 2 MG/ML injection 2 mg  2 mg Intravenous Q4H PRN Harrie Foreman, MD      .  ondansetron (ZOFRAN) tablet 4 mg  4 mg Oral Q6H PRN Harrie Foreman, MD       Or  . ondansetron [facility]) injection 4 mg  4 mg Intravenous Q6H PRN Harrie Foreman, MD   4 mg at 12/22/15 1638  . potassium chloride (K-DUR,KLOR-CON) CR tablet 10 mEq  10 mEq Oral Daily Harrie Foreman, MD   10 mEq at 12/22/15 0939  . sertraline (ZOLOFT) tablet 50 mg  50 mg Oral Daily Harrie Foreman, MD   50 mg at 12/22/15 6962   Facility-Administered Medications Ordered in Other Encounters  Medication Dose Route Frequency Provider Last Rate Last Dose  . 0.9 %  sodium chloride infusion   Intravenous Continuous Forest Gleason, MD 10 mL/hr at 11/04/15 1415    . sodium chloride 0.9 % injection 10 mL  10 mL Intracatheter PRN Forest Gleason, MD   10 mL at 05/06/15 1028  . sodium chloride 0.9 % injection 10 mL  10 mL Intravenous PRN Forest Gleason, MD      . sodium chloride 0.9 % injection 10 mL  10 mL Intravenous PRN Forest Gleason, MD        OBJECTIVE:  Filed Vitals:   12/22/15 0535 12/22/15 1359  BP: 97/47 115/43  Pulse: 78 73  Temp: 97.9 F (36.6 C) 97.8 F (36.6 C)  Resp: 12 19     Body mass index is 21.63 kg/(m^2).    ECOG FS:1 - Symptomatic but completely ambulatory  PHYSICAL EXAM:  general status: Patient is feeling weak and tired.  No change in a performance status.  No chills.  No fever. HEENT:   No evidence of stomatitis Lungs: No cough or shortness of breath Cardiac: No chest pain or paroxysmal nocturnal dyspnea GI: No nausea no vomiting no diarrhea no abdominal pain Skin: Poor skin turgor. rash maculopapular in the back as well as in abdominal area and upper  extremity  Clinical signs of dehydration Lower extremity no swelling Neurological system: No tingling.  No numbness.  No other focal signs Musculoskeletal system no bony pains Lymphatic system: Palpable left supraclavicular lymph node. Slightly increased size   LAB RESULTS:  Admission on 12/21/2015  Component Date Value Ref Range Status  . Sodium 12/21/2015 132* 135 - 145 mmol/L Final  . Potassium 12/21/2015 4.0  3.5 - 5.1 mmol/L Final  . Chloride 12/21/2015 101  101 - 111 mmol/L Final  . CO2 12/21/2015 21* 22 - 32 mmol/L Final  . Glucose, Bld 12/21/2015 101* 65 - 99 mg/dL Final  . BUN 12/21/2015 28* 6 - 20 mg/dL Final  . Creatinine, Ser 12/21/2015 1.85* 0.44 - 1.00 mg/dL Final  . Calcium 12/21/2015 9.1  8.9 - 10.3 mg/dL Final  . GFR calc non Af Amer 12/21/2015 27* >60 mL/min Final  . GFR calc Af Amer 12/21/2015 32* >60 mL/min Final   Comment: (NOTE) The eGFR has been calculated using the CKD EPI equation. This calculation has not been validated in all clinical situations. eGFR's persistently <60 mL/min signify possible Chronic Kidney Disease.   . Anion gap 12/21/2015 10  5 - 15 Final  . WBC 12/21/2015 6.5  3.6 - 11.0 K/uL Final  . RBC 12/21/2015 3.24* 3.80 - 5.20 MIL/uL Final  . Hemoglobin 12/21/2015 9.0* 12.0 - 16.0 g/dL Final  . HCT 12/21/2015 27.7* 35.0 - 47.0 % Final  . MCV 12/21/2015 85.5  80.0 - 100.0 fL Final  . MCH 12/21/2015 27.8  26.0 - 34.0 pg Final  .  MCHC 12/21/2015 32.6  32.0 - 36.0 g/dL Final  . RDW 12/21/2015 14.0  11.5 - 14.5 % Final  . Platelets 12/21/2015 320  150 - 440 K/uL Final  . Color, Urine 12/21/2015 YELLOW* YELLOW Final  . APPearance 12/21/2015 CLEAR* CLEAR Final  . Glucose, UA 12/21/2015 NEGATIVE  NEGATIVE mg/dL Final  . Bilirubin Urine 12/21/2015 NEGATIVE  NEGATIVE Final  . Ketones, ur 12/21/2015 NEGATIVE  NEGATIVE mg/dL Final  . Specific Gravity, Urine 12/21/2015 1.012  1.005 - 1.030 Final  . Hgb urine dipstick 12/21/2015 NEGATIVE  NEGATIVE  Final  . pH 12/21/2015 5.0  5.0 - 8.0 Final  . Protein, ur 12/21/2015 NEGATIVE  NEGATIVE mg/dL Final  . Nitrite 12/21/2015 NEGATIVE  NEGATIVE Final  . Leukocytes, UA 12/21/2015 NEGATIVE  NEGATIVE Final  . RBC / HPF 12/21/2015 0-5  0 - 5 RBC/hpf Final  . WBC, UA 12/21/2015 0-5  0 - 5 WBC/hpf Final  . Bacteria, UA 12/21/2015 NONE SEEN  NONE SEEN Final  . Squamous Epithelial / LPF 12/21/2015 NONE SEEN  NONE SEEN Final  . Mucous 12/21/2015 PRESENT   Final  . Hyaline Casts, UA 12/21/2015 PRESENT   Final  . Lactic Acid, Venous 12/21/2015 0.6  0.5 - 2.0 mmol/L Final  . Lactic Acid, Venous 12/21/2015 0.5  0.5 - 2.0 mmol/L Final  . Troponin I 12/21/2015 <0.03  <0.031 ng/mL Final   Comment:        NO INDICATION OF MYOCARDIAL INJURY.   . TSH 12/21/2015 2.286  0.350 - 4.500 uIU/mL Final  . Hgb A1c MFr Bld 12/21/2015 4.9  4.0 - 6.0 % Final    Lab Results  Component Value Date   CA125 18.8 07/23/2014    ASSESSMENT: Carcinoma of endometrium  Stage IV disease.  Patient has failed  multiple treatment CT scan has been reviewed   In dependently ,  shows progressive disease also severe constipation.   generalised  rash can be secondary to NIVOLULAMAB Hopefully prednisone would help Will start patient on Eucerin cream MEDICAL DECISION MAKING:  Rising BUN and creatinine most likely secondary to dehydration and poor oral intake  Anemia multifactorial Will continue to observe that.. We will start patient on IV steroid to help with poor appetite and persistent nausea. Patient has progressing disease on NIVOLULAMAB based on CT scan Reevaluate CBC if hemoglobin drops below 8 g patient may be .transfusion.  . Case  was discussed with hospitalist taking care of the patient. Cancer of uterus   Staging form: Corpus Uteri - Carcinoma, AJCC 7th Edition     Clinical: Stage IVB (T3b, N2, M1) - Signed by Forest Gleason, MD on 04/18/2015   Forest Gleason, MD   12/22/2015 4:47 PM

## 2015-12-22 NOTE — ED Notes (Signed)
Patient can have ice cream per Dr. Joni Fears, took patient a serving.

## 2015-12-23 ENCOUNTER — Inpatient Hospital Stay: Payer: Medicare Other

## 2015-12-23 ENCOUNTER — Inpatient Hospital Stay: Payer: Medicare Other | Admitting: Oncology

## 2015-12-23 DIAGNOSIS — K5900 Constipation, unspecified: Secondary | ICD-10-CM

## 2015-12-23 LAB — CBC
HEMATOCRIT: 19.2 % — AB (ref 35.0–47.0)
Hemoglobin: 6.4 g/dL — ABNORMAL LOW (ref 12.0–16.0)
MCH: 28.3 pg (ref 26.0–34.0)
MCHC: 33.1 g/dL (ref 32.0–36.0)
MCV: 85.4 fL (ref 80.0–100.0)
PLATELETS: 225 10*3/uL (ref 150–440)
RBC: 2.25 MIL/uL — ABNORMAL LOW (ref 3.80–5.20)
RDW: 13.4 % (ref 11.5–14.5)
WBC: 5.5 10*3/uL (ref 3.6–11.0)

## 2015-12-23 LAB — PREPARE RBC (CROSSMATCH)

## 2015-12-23 LAB — BASIC METABOLIC PANEL
ANION GAP: 5 (ref 5–15)
BUN: 18 mg/dL (ref 6–20)
CALCIUM: 7.8 mg/dL — AB (ref 8.9–10.3)
CO2: 19 mmol/L — AB (ref 22–32)
Chloride: 111 mmol/L (ref 101–111)
Creatinine, Ser: 1.54 mg/dL — ABNORMAL HIGH (ref 0.44–1.00)
GFR calc Af Amer: 39 mL/min — ABNORMAL LOW (ref >60)
GFR, EST NON AFRICAN AMERICAN: 34 mL/min — AB (ref >60)
GLUCOSE: 89 mg/dL (ref 65–99)
Potassium: 3.5 mmol/L (ref 3.5–5.1)
Sodium: 135 mmol/L (ref 135–145)

## 2015-12-23 MED ORDER — MAGNESIUM HYDROXIDE 400 MG/5ML PO SUSP
30.0000 mL | Freq: Three times a day (TID) | ORAL | Status: DC | PRN
  Administered 2015-12-23: 30 mL via ORAL
  Filled 2015-12-23: qty 30

## 2015-12-23 MED ORDER — HYDROCODONE-ACETAMINOPHEN 5-325 MG PO TABS
1.0000 | ORAL_TABLET | ORAL | Status: DC | PRN
  Administered 2015-12-23 – 2015-12-24 (×2): 1 via ORAL
  Filled 2015-12-23 (×2): qty 1

## 2015-12-23 MED ORDER — SODIUM CHLORIDE 0.9 % IV SOLN
Freq: Once | INTRAVENOUS | Status: AC
  Administered 2015-12-23: 14:00:00 via INTRAVENOUS

## 2015-12-23 NOTE — Progress Notes (Signed)
[name] from the [facility] blood bank (ext [phone]) previously called and asked that an order in [facility] for Transfusion of pRBCs be clarified, order says irradiate and wash; pt's blood bank history only indicates irradiate; telephone call to Dr Darvin Neighbours to clarify, order received read back to MD to irradiate only; telephone call to the blood bank; advised [name] Graham of Dr Boykin Reaper order; [name] Graham verbalized understanding

## 2015-12-23 NOTE — Discharge Instructions (Signed)

## 2015-12-23 NOTE — Plan of Care (Signed)
Problem: Fluid Volume: Goal: Ability to maintain a balanced intake and output will improve Outcome: Progressing Pt educated prior to blood administation of pRBCs for the signs and symptoms of a blood transfusion reaction

## 2015-12-23 NOTE — Plan of Care (Addendum)
VSS, afebrile. IVF infusing as ordered. Continued poor appetite.   Problem: Safety: Goal: Ability to remain free from injury will improve Outcome: Progressing Moderate fall risk. Safely ambulates independently in room. Safe environment provided.   Problem: Pain Managment: Goal: General experience of comfort will improve Outcome: Progressing No c/o pain, resting peacefully through the night  Problem: Bowel/Gastric: Goal: Will not experience complications related to bowel motility Outcome: Progressing C/o nausea but refused medication when offered. Multiple BM yesterday.

## 2015-12-23 NOTE — Progress Notes (Signed)
[place] at [place] NAME: [name] Graham    MR#:  [id number]  DATE OF BIRTH:  [birth year]  SUBJECTIVE:  CHIEF COMPLAINT:   Improved. Dizziness is better. No further vomiting. Had 5 BMs overnight with enema   REVIEW OF SYSTEMS:  CONSTITUTIONAL: No fever, fatigue or weakness.  EYES: No blurred or double vision.  EARS, NOSE, AND THROAT: No tinnitus or ear pain.  RESPIRATORY: No cough, shortness of breath, wheezing or hemoptysis.  CARDIOVASCULAR: No chest pain, orthopnea, edema.  GASTROINTESTINAL: Has nausea, denies vomiting, diarrhea .reporting constipation for 2 days and lower  abdominal pain.  GENITOURINARY: No dysuria, hematuria.  ENDOCRINE: No polyuria, nocturia,  HEMATOLOGY: No anemia, easy bruising or bleeding SKIN: No rash or lesion. MUSCULOSKELETAL: No joint pain or arthritis.   NEUROLOGIC: No tingling, numbness, weakness.  PSYCHIATRY: No anxiety or depression.   DRUG ALLERGIES:   Allergies  Allergen Reactions  . Penicillins Rash and Other (See Comments)    Patient states that reaction was 20 years ago, but cannot remember the specifics of the reaction    VITALS:  Blood pressure 138/67, pulse 67, temperature 98.1 F (36.7 C), temperature source Oral, resp. rate 18, height 5\' 3"  (1.6 m), weight 58.015 kg (127 lb 14.4 oz), SpO2 98 %.  PHYSICAL EXAMINATION:  GENERAL:  65 y.o.-year-old patient lying in the bed with no acute distress.  EYES: Pupils equal, round, reactive to light and accommodation. No scleral icterus. Extraocular muscles intact.  HEENT: Head atraumatic, normocephalic. Oropharynx and nasopharynx clear.  NECK:  Supple, no jugular venous distention. No thyroid enlargement, no tenderness.  LUNGS: Normal breath sounds bilaterally, no wheezing, rales,rhonchi or crepitation. No use of accessory muscles of respiration.  CARDIOVASCULAR: S1, S2 normal. No murmurs, rubs, or gallops.  ABDOMEN: Soft, lower abdominal  tenderness is present but no rebound tenderness , nondistended. Bowel sounds are hypoactive . No organomegaly or mass.  EXTREMITIES: No pedal edema, cyanosis, or clubbing.  NEUROLOGIC: Cranial nerves II through XII are intact. Muscle strength 5/5 in all extremities. Sensation intact. Gait not checked.  PSYCHIATRIC: The patient is alert and oriented x 3.  SKIN: No obvious rash, lesion, or ulcer.    LABORATORY PANEL:   CBC  Recent Labs Lab 12/23/15 0450  WBC 5.5  HGB 6.4*  HCT 19.2*  PLT 225   ------------------------------------------------------------------------------------------------------------------  Chemistries   Recent Labs Lab 12/23/15 0450  NA 135  K 3.5  CL 111  CO2 19*  GLUCOSE 89  BUN 18  CREATININE 1.54*  CALCIUM 7.8*   ------------------------------------------------------------------------------------------------------------------  Cardiac Enzymes  Recent Labs Lab 12/21/15 1933  TROPONINI <0.03   ------------------------------------------------------------------------------------------------------------------  RADIOLOGY:  Ct Abdomen Pelvis Wo Contrast  12/21/2015  CLINICAL DATA:  Intermittent lower abdominal pain for 1 year, worsening over last 2 days. Diarrhea and vomiting for 2 days. History of metastatic uterine cancer, status post hysterectomy. EXAM: CT ABDOMEN AND PELVIS WITHOUT CONTRAST TECHNIQUE: Multidetector CT imaging of the abdomen and pelvis was performed following the standard protocol without IV contrast. COMPARISON:  PET-CT September 18, 2015 and CT abdomen and pelvis February 18, 2015 FINDINGS: LUNG BASES: Included view of the lung bases are clear. The visualized heart and pericardium are unremarkable. Mild gaseous distended distal esophagus can be seen with reflux. KIDNEYS/BLADDER: Kidneys are orthotopic, demonstrating normal size and morphology. No nephrolithiasis, hydronephrosis; limited assessment for renal masses on this nonenhanced  examination. The unopacified ureters are normal in course and caliber. Urinary bladder is partially  distended and unremarkable. SOLID ORGANS: The liver, spleen, gallbladder, and adrenal glands are unremarkable for this non-contrast examination. Pancreas is anteriorly displaced due to retroperitoneal lymphadenopathy, otherwise negative by noncontrast CT. GASTROINTESTINAL TRACT: Multiple loops of mildly distended small bowel measuring up to 4 mm. Moderate amount of retained large bowel stool. Enteric contrast has not yet reached the distal small bowel. The stomach, large bowel are normal in course and caliber without inflammatory changes, the sensitivity may be decreased by lack of enteric contrast. Normal appendix. PERITONEUM/RETROPERITONEUM: Limited assessment without contrast, there is increasing retroperitoneal lymphadenopathy encasing the aorta, measuring up to 3.2 cm AP dimension, previously 2.4 cm. Stable 15 mm mesenteric lymph node. Aortoiliac vessels are normal in course and caliber. Status post hysterectomy. No intraperitoneal free fluid nor free air. SOFT TISSUES/ OSSEOUS STRUCTURES: Sclerotic lesion anterior L2 without pathologic fracture is new from February 18, 2015. IMPRESSION: Multiple loops of distended small bowel, without transition point, possible ileus. Moderate amount of retained large bowel stool. Limited noncontrast CT with increasing retroperitoneal lymphadenopathy from prior PET-CT concerning for disease progression. L2 sclerotic lesion new from February 18, 2015 consistent with osseous metastasis. Electronically Signed   By: Elon Alas M.D.   On: 12/21/2015 21:53   Ct Head Wo Contrast  12/21/2015  CLINICAL DATA:  Acute onset of dizziness and headache. Nausea, vomiting and diarrhea. Initial encounter. EXAM: CT HEAD WITHOUT CONTRAST TECHNIQUE: Contiguous axial images were obtained from the base of the skull through the vertex without intravenous contrast. COMPARISON:  CT of the head  performed 02/18/2015 FINDINGS: There is no evidence of acute infarction, mass lesion, or intra- or extra-axial hemorrhage on CT. Prominence of the sulci suggests mild cortical volume loss. Mild periventricular and subcortical white matter change likely reflects small vessel ischemic microangiopathy. The brainstem and fourth ventricle are within normal limits. The basal ganglia are unremarkable in appearance. The cerebral hemispheres demonstrate grossly normal gray-white differentiation. No mass effect or midline shift is seen. There is no evidence of fracture; visualized osseous structures are unremarkable in appearance. The visualized portions of the orbits are within normal limits. The paranasal sinuses and mastoid air cells are well-aerated. No significant soft tissue abnormalities are seen. IMPRESSION: 1. No acute intracranial pathology seen on CT. 2. Mild cortical volume loss and scattered small vessel ischemic microangiopathy. Electronically Signed   By: Garald Balding M.D.   On: 12/21/2015 18:19   Dg Chest Portable 1 View  12/21/2015  CLINICAL DATA:  Uterine carcinoma.  Dizziness.  Vomiting. EXAM: PORTABLE CHEST 1 VIEW COMPARISON:  PET-CT September 18, 2015 FINDINGS: Port-A-Cath tip is in the superior vena cava. No pneumothorax. Lungs are clear. Heart size and pulmonary vascularity are normal. No adenopathy. No bone lesions. IMPRESSION: No edema or consolidation. Electronically Signed   By: Lowella Grip III M.D.   On: 12/21/2015 19:40    EKG:   Orders placed or performed during the hospital encounter of 12/21/15  . ED EKG  . ED EKG  . EKG 12-Lead  . EKG 12-Lead    ASSESSMENT AND PLAN:   This is a 65 year old female with uterine cancer admitted for ileus.  1. Ileus: Secondary to radiation. Diet as tolerated. resolved  2. Intractable nausea and vomiting:  Vomiting is resolved   3. Chronic kidney disease: Stage IV;  hydrate with intravenous fluid. Avoid nephrotoxic drugs.  4.  Dehydration with hyponatremia Secondary to nausea and vomiting.  IV fluids, check BMP in a.m.  5. Metastatic uterine cancer stage IV-status post radiation  Patient is reporting radiation-induced rectal spasms Consult oncology Dr. Donnetta Hail Patient is started on Solu-Cortef 100 mg IV once daily for 3 days  6. Anemia of chronic disease Worsening Transfuse 2 units.  D/C in AM   All the records are reviewed and case discussed with Care Management/Social Workerr. Management plans discussed with the patient, family and they are in agreement.  CODE STATUS: FULL CODE  TOTAL TIME TAKING CARE OF THIS PATIENT: 35 minutes.   POSSIBLE D/C TOMORROW DEPENDING ON CLINICAL CONDITION.   Hillary Bow R M.D on 12/23/2015 at 5:30 PM  Between 7am to 6pm - Pager - [phone]  After 6pm go to [email or web address] - password [email or web address] [facility]  Office  [phone]  CC: Primary care physician; [name], MD

## 2015-12-23 NOTE — Progress Notes (Signed)
[place] @ [facility] [phone]  [phone]     [name] Graham OB: 1951-04-30  MR#: [id number]  ZSM#:[id number]  [name], MD as PCP - General (Family Medicine) [name], MD (General Surgery)  CHIEF COMPLAINT:  Chief Complaint  Patient presents with  . Dizziness  . Diarrhea    Chief Complaint/Diagnosis:   The patient is a 65 year old with metastatic endometrial cancer who is seen for assessment during ongoing palliative radiation therapy.  04/2011   papillary serous adenocarcinoma of the endometrium, stage IIIb (nodal metastasis)              TAHBSO and full staging,               GOG protocl using XRT and chemotherapy, significant XRT side-effects,               continued on chemotherapy off protocol, 04/2013   recurrence in the nodal areas,              DDP and docetaxel x 6 completed in December,   started on chemotherapy with Adriamycin(October, 2015) poor tolerance to letrozole  and affinator Estrogen and progesterone receptor negative tumor         hER-2/neu recetor negative. November 07, 2014 progressive disease by  tumor marker criteria and clinical examination November 12, 2014 After 6 cycles of chemotherapy because of increasing side effect chemotherapy was discontinued.  (March, 2016) Patient was started on cis-platinum and topotecan. May, 2016 Cis-platinum and topotecan  and was discontinued because of significant side effect and possibility of progressive disease.  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  This was admitted in the hospital with dehydration feeling lightheaded.  CT scan revealed severe constipation.  He retroperitoneal lymph nodes are progressing. After hydration hemoglobin has dropped to 6.4 and patient is getting blood transfusion today. [place] pain is improved.  No nausea no vomiting no diarrhea.  REVIEW OF SYSTEMS:    general status: Patient is feeling weak and tired.  Feeling lightheaded No change in a performance status.  No chills.  No fever.  Feeling extremely weak and tired HEENT: Alopecia.  No evidence of stomatitis increasing discomfort on the left side of the neck Lungs: No cough or shortness of breath Cardiac: No chest pain or paroxysmal nocturnal dyspnea GI: diarrhea and constipation is improved.  Abdominal pain is improved. On stay patient followed by black stool. Skin: Macular rash grade 2, as described above.  Patient is taking Zyrtec in the morning and Benadryl in the night   Lower extremity no swelling Neurological system: No tingling.  No numbness.  No other focal signs Musculoskeletal system no bony  pains poor appetite has lost approximately 5 pounds in last month clock  PAST MEDICAL HISTORY: Past Medical History  Diagnosis Date  . Cancer ([place]) 2012  . GI problem 2013  . Cancer of uterus ([place]) 04/18/2015  . Uterine cancer ([place])     PAST SURGICAL HISTORY: Past Surgical History  Procedure Laterality Date  . Abdominal hysterectomy    . Tonsillectomy      FAMILY HISTORY No significant family history.      ADVANCED DIRECTIVES: Patient does have advance directive   HEALTH MAINTENANCE: Social History  Substance Use Topics  . Smoking status: Never Smoker   . Smokeless tobacco: Never Used  . Alcohol Use: Yes      Allergies  Allergen  Reactions  . Penicillins Rash and Other (See Comments)    Patient states that reaction was 20 years ago, but cannot remember the specifics of the reaction    Current Facility-Administered Medications  Medication Dose Route Frequency Provider Last Rate Last Dose  . 0.9 %  sodium chloride infusion   Intravenous Continuous Harrie Foreman, MD 100 mL/hr at 12/23/15 0423    . 0.9 %  sodium chloride infusion   Intravenous Once Harrie Foreman, MD      . acetaminophen (TYLENOL) tablet 650 mg  650 mg Oral Q6H PRN Harrie Foreman, MD   650 mg at 12/23/15 0801   Or  . acetaminophen (TYLENOL) suppository 650 mg  650 mg Rectal Q6H PRN Harrie Foreman, MD      . ALPRAZolam Duanne Moron) tablet 0.25 mg  0.25 mg Oral BID PRN Harrie Foreman, MD   0.25 mg at 12/23/15 0801  . amitriptyline (ELAVIL) tablet 25 mg  25 mg Oral QHS Harrie Foreman, MD   25 mg at 12/22/15 1939  . bisacodyl (DULCOLAX) suppository 10 mg  10 mg Rectal Daily Nicholes Mango, MD   10 mg at 12/22/15 1435  . docusate sodium (COLACE) capsule 100 mg  100 mg Oral BID Harrie Foreman, MD   100 mg at 12/23/15 0801  . fentaNYL (DURAGESIC - dosed mcg/hr) 12.5 mcg  12.5 mcg Transdermal Q72H Harrie Foreman, MD      . fentaNYL ([place] - dosed mcg/hr) patch 25 mcg  25 mcg Transdermal Q72H Harrie Foreman, MD      . fluticasone [facility]) 50 MCG/ACT nasal spray 1-2 spray  1-2 spray Each Nare Daily PRN Harrie Foreman, MD      . heparin injection 5,000 Units  5,000 Units Subcutaneous 3 times per day Harrie Foreman, MD   5,000 Units at 12/23/15 0453  . hydrocerin (EUCERIN) cream   Topical BID Nicholes Mango, MD   1 application at 76/81/15 1000  . hydrocerin (EUCERIN) cream   Topical BID Forest Gleason, MD      . hydrocortisone sodium succinate (SOLU-CORTEF) 100 MG injection 100 mg  100 mg Intravenous Daily Nicholes Mango, MD   100 mg at 12/23/15 0801  . loratadine (CLARITIN) tablet 10 mg  10 mg Oral Daily Harrie Foreman, MD   10 mg at  12/23/15 0800  . meclizine (ANTIVERT) tablet 25 mg  25 mg Oral QID PRN Harrie Foreman, MD      . morphine 2 MG/ML injection 2 mg  2 mg Intravenous Q4H PRN Harrie Foreman, MD      . ondansetron [facility]) tablet 4 mg  4 mg Oral Q6H PRN Harrie Foreman, MD   4 mg at  12/23/15 0800   Or  . ondansetron (ZOFRAN) injection 4 mg  4 mg Intravenous Q6H PRN Harrie Foreman, MD   4 mg at 12/22/15 1638  . potassium chloride (K-DUR,KLOR-CON) CR tablet 10 mEq  10 mEq Oral Daily Harrie Foreman, MD   10 mEq at 12/23/15 0801  . sertraline (ZOLOFT) tablet 50 mg  50 mg Oral Daily Harrie Foreman, MD   50 mg at 12/23/15 0801   Facility-Administered Medications Ordered in Other Encounters  Medication Dose Route Frequency Provider Last Rate Last Dose  . 0.9 %  sodium chloride infusion   Intravenous Continuous Forest Gleason, MD 10 mL/hr at 11/04/15 1415    . sodium chloride 0.9 % injection 10 mL  10 mL Intracatheter PRN Forest Gleason, MD   10 mL at 05/06/15 1028  . sodium chloride 0.9 % injection 10 mL  10 mL Intravenous PRN Forest Gleason, MD      . sodium chloride 0.9 % injection 10 mL  10 mL Intravenous PRN Forest Gleason, MD        OBJECTIVE:  Filed Vitals:   12/23/15 0945 12/23/15 1030  BP: 120/58 136/63  Pulse: 70 71  Temp: 98.1 F (36.7 C) 98 F (36.7 C)  Resp: 18 18     Body mass index is 22.66 kg/(m^2).    ECOG FS:1 - Symptomatic but completely ambulatory  PHYSICAL EXAM:  general status: Patient is feeling weak and tired.  No change in a performance status.  No chills.  No fever. HEENT:   No evidence of stomatitis Lungs: No cough or shortness of breath Cardiac: No chest pain or paroxysmal nocturnal dyspnea GI: No nausea no vomiting no diarrhea no abdominal pain Skin: Poor skin turgor. rash maculopapular in the back as well as in abdominal area and upper extremity  Clinical signs of dehydration Lower extremity no swelling Neurological system: No tingling.  No numbness.  No other focal  signs Musculoskeletal system no bony pains Lymphatic system: Palpable left supraclavicular lymph node. Slightly increased size   LAB RESULTS:  Admission on 12/21/2015  Component Date Value Ref Range Status  . Sodium 12/21/2015 132* 135 - 145 mmol/L Final  . Potassium 12/21/2015 4.0  3.5 - 5.1 mmol/L Final  . Chloride 12/21/2015 101  101 - 111 mmol/L Final  . CO2 12/21/2015 21* 22 - 32 mmol/L Final  . Glucose, Bld 12/21/2015 101* 65 - 99 mg/dL Final  . BUN 12/21/2015 28* 6 - 20 mg/dL Final  . Creatinine, Ser 12/21/2015 1.85* 0.44 - 1.00 mg/dL Final  . Calcium 12/21/2015 9.1  8.9 - 10.3 mg/dL Final  . GFR calc non Af Amer 12/21/2015 27* >60 mL/min Final  . GFR calc Af Amer 12/21/2015 32* >60 mL/min Final   Comment: (NOTE) The eGFR has been calculated using the CKD EPI equation. This calculation has not been validated in all clinical situations. eGFR's persistently <60 mL/min signify possible Chronic Kidney Disease.   . Anion gap 12/21/2015 10  5 - 15 Final  . WBC 12/21/2015 6.5  3.6 - 11.0 K/uL Final  . RBC 12/21/2015 3.24* 3.80 - 5.20 MIL/uL Final  . Hemoglobin 12/21/2015 9.0* 12.0 - 16.0 g/dL Final  . HCT 12/21/2015 27.7* 35.0 - 47.0 % Final  . MCV 12/21/2015 85.5  80.0 - 100.0 fL Final  . MCH 12/21/2015 27.8  26.0 - 34.0 pg Final  . MCHC 12/21/2015 32.6  32.0 - 36.0 g/dL Final  . RDW 12/21/2015 14.0  11.5 -  14.5 % Final  . Platelets 12/21/2015 320  150 - 440 K/uL Final  . Color, Urine 12/21/2015 YELLOW* YELLOW Final  . APPearance 12/21/2015 CLEAR* CLEAR Final  . Glucose, UA 12/21/2015 NEGATIVE  NEGATIVE mg/dL Final  . Bilirubin Urine 12/21/2015 NEGATIVE  NEGATIVE Final  . Ketones, ur 12/21/2015 NEGATIVE  NEGATIVE mg/dL Final  . Specific Gravity, Urine 12/21/2015 1.012  1.005 - 1.030 Final  . Hgb urine dipstick 12/21/2015 NEGATIVE  NEGATIVE Final  . pH 12/21/2015 5.0  5.0 - 8.0 Final  . Protein, ur 12/21/2015 NEGATIVE  NEGATIVE mg/dL Final  . Nitrite 12/21/2015 NEGATIVE   NEGATIVE Final  . Leukocytes, UA 12/21/2015 NEGATIVE  NEGATIVE Final  . RBC / HPF 12/21/2015 0-5  0 - 5 RBC/hpf Final  . WBC, UA 12/21/2015 0-5  0 - 5 WBC/hpf Final  . Bacteria, UA 12/21/2015 NONE SEEN  NONE SEEN Final  . Squamous Epithelial / LPF 12/21/2015 NONE SEEN  NONE SEEN Final  . Mucous 12/21/2015 PRESENT   Final  . Hyaline Casts, UA 12/21/2015 PRESENT   Final  . Lactic Acid, Venous 12/21/2015 0.6  0.5 - 2.0 mmol/L Final  . Lactic Acid, Venous 12/21/2015 0.5  0.5 - 2.0 mmol/L Final  . Troponin I 12/21/2015 <0.03  <0.031 ng/mL Final   Comment:        NO INDICATION OF MYOCARDIAL INJURY.   . TSH 12/21/2015 2.286  0.350 - 4.500 uIU/mL Final  . Hgb A1c MFr Bld 12/21/2015 4.9  4.0 - 6.0 % Final  . WBC 12/23/2015 5.5  3.6 - 11.0 K/uL Final  . RBC 12/23/2015 2.25* 3.80 - 5.20 MIL/uL Final  . Hemoglobin 12/23/2015 6.4* 12.0 - 16.0 g/dL Final   RESULT REPEATED AND VERIFIED  . HCT 12/23/2015 19.2* 35.0 - 47.0 % Final  . MCV 12/23/2015 85.4  80.0 - 100.0 fL Final  . MCH 12/23/2015 28.3  26.0 - 34.0 pg Final  . MCHC 12/23/2015 33.1  32.0 - 36.0 g/dL Final  . RDW 12/23/2015 13.4  11.5 - 14.5 % Final  . Platelets 12/23/2015 225  150 - 440 K/uL Final  . Sodium 12/23/2015 135  135 - 145 mmol/L Final  . Potassium 12/23/2015 3.5  3.5 - 5.1 mmol/L Final  . Chloride 12/23/2015 111  101 - 111 mmol/L Final  . CO2 12/23/2015 19* 22 - 32 mmol/L Final  . Glucose, Bld 12/23/2015 89  65 - 99 mg/dL Final  . BUN 12/23/2015 18  6 - 20 mg/dL Final  . Creatinine, Ser 12/23/2015 1.54* 0.44 - 1.00 mg/dL Final  . Calcium 12/23/2015 7.8* 8.9 - 10.3 mg/dL Final  . GFR calc non Af Amer 12/23/2015 34* >60 mL/min Final  . GFR calc Af Amer 12/23/2015 39* >60 mL/min Final   Comment: (NOTE) The eGFR has been calculated using the CKD EPI equation. This calculation has not been validated in all clinical situations. eGFR's persistently <60 mL/min signify possible Chronic Kidney Disease.   . Anion gap 12/23/2015  5  5 - 15 Final  . ABO/RH(D) 12/23/2015 O POS   Final  . Antibody Screen 12/23/2015 NEG   Final  . Sample Expiration 12/23/2015 12/26/2015   Final  . Unit Number 12/23/2015 [id number]   Final  . Blood Component Type 12/23/2015 RCLI PHER 1   Final  . Unit division 12/23/2015 00   Final  . Status of Unit 12/23/2015 ALLOCATED   Final  . Transfusion Status 12/23/2015 OK TO TRANSFUSE   Final  .  Crossmatch Result 12/23/2015 Compatible   Final  . Unit Number 12/23/2015 [id number]   Final  . Blood Component Type 12/23/2015 RBC, LR IRR   Final  . Unit division 12/23/2015 00   Final  . Status of Unit 12/23/2015 ISSUED   Final  . Transfusion Status 12/23/2015 OK TO TRANSFUSE   Final  . Crossmatch Result 12/23/2015 Compatible   Final  . Order Confirmation 12/23/2015 ORDER PROCESSED BY BLOOD BANK   Final    Lab Results  Component Value Date   CA125 18.8 07/23/2014    ASSESSMENT: Carcinoma of endometrium  Stage IV disease.  Dehydration is getting better Anemia: Multifactorial.  Agree with 2 units of packed cell transfusion. The patient goes home tomorrow then I will see her next week with CBC and comprehensive metabolic panel   Cancer of uterus   Staging form: Corpus Uteri - Carcinoma, AJCC 7th Edition     Clinical: Stage IVB (T3b, N2, M1) - Signed by Forest Gleason, MD on 04/18/2015   Forest Gleason, MD   12/23/2015 1:41 PM

## 2015-12-24 ENCOUNTER — Other Ambulatory Visit: Payer: Self-pay | Admitting: *Deleted

## 2015-12-24 DIAGNOSIS — C541 Malignant neoplasm of endometrium: Secondary | ICD-10-CM

## 2015-12-24 LAB — TYPE AND SCREEN
ABO/RH(D): O POS
Antibody Screen: NEGATIVE
Unit division: 0
Unit division: 0

## 2015-12-24 LAB — HEMOGLOBIN: HEMOGLOBIN: 9.5 g/dL — AB (ref 12.0–16.0)

## 2015-12-24 MED ORDER — HEPARIN SOD (PORK) LOCK FLUSH 100 UNIT/ML IV SOLN
500.0000 [IU] | Freq: Once | INTRAVENOUS | Status: AC
  Administered 2015-12-24: 11:00:00 500 [IU] via INTRAVENOUS
  Filled 2015-12-24: qty 5

## 2015-12-24 NOTE — Care Management Important Message (Signed)
Important Message  Patient Details  Name: [name] Graham MRN: [id number] Date of Birth: 03-16-1951   Medicare Important Message Given:  Yes    [name] 12/24/2015, 10:05 AM

## 2015-12-24 NOTE — Plan of Care (Signed)
Problem: Pain Managment: Goal: General experience of comfort will improve Outcome: Progressing Patient with complaints of pain in the abdomen.  Milk of magnesia given without relief.  Norco given with pain resolved upon reassessment.   Problem: Bowel/Gastric: Goal: Will not experience complications related to bowel motility Outcome: Progressing Patient with nausea/vomiting this shift.  Patient given Zofran IV per eMAR.  N/V resolved upon reassessment.

## 2015-12-24 NOTE — Progress Notes (Signed)
Patient discharged home per MD orders. All discharge instructions given and all questions answered. Escorted by [employer]. And friend.

## 2015-12-25 NOTE — Discharge Summary (Signed)
[place] at [place] NAME: [name] Graham    MR#:  [id number]  DATE OF BIRTH:  [birth year]  DATE OF ADMISSION:  12/21/2015 ADMITTING PHYSICIAN: Harrie Foreman, MD  DATE OF DISCHARGE: 12/24/2015 11:20 AM  PRIMARY CARE PHYSICIAN: [name], MD    ADMISSION DIAGNOSIS:  Orthostatic hypotension [I95.1] Abdominal pain [R10.9]  DISCHARGE DIAGNOSIS:  Active Problems:   Ileus ([place])   SECONDARY DIAGNOSIS:   Past Medical History  Diagnosis Date  . Cancer ([place]) 2012  . GI problem 2013  . Cancer of uterus ([place]) 04/18/2015  . Uterine cancer [facility])      ADMITTING HISTORY  Chief Complaint: Dizziness HPI: The patient presents to the emergency department complaining of lightheadedness, dizziness and shortness of breath. Altered symptoms are exacerbated when she stands and walks around. The symptoms have been present for approximately one week. She also notes diarrhea although she admits that she is usually constipated. Past medical history is significant for uterine cancer. The patient has also undergone radiation to her abdomen in the past. She is currently taking chemotherapy. During evaluation revealed anemia, hyponatremia and chronic kidney disease which brought to the emergency department staff to call for admission.   HOSPITAL COURSE:   1. Ileus: Secondary to radiation. Diet as tolerated. resolved  2. Intractable nausea and vomiting:  Vomiting is resolved   3. Chronic kidney disease: Stage IV;  hydrate with intravenous fluid. Avoid nephrotoxic drugs.  4. Dehydration with hyponatremia Secondary to nausea and vomiting.  Improved with IVF  5. Metastatic uterine cancer stage IV-status post radiation Patient is reporting radiation-induced rectal spasms Consult oncology Dr. Donnetta Hail Patient is started on Solu-Cortef 100 mg IV once daily for 3 days and finished course  6. Anemia of chronic disease Worsening Transfuse  2 units. Patient had extensive workup in the past for her anemia. I have discussed this with Dr. Oliva Bustard. He has advised patient following up with him in one week for repeat labs and further transfusion if needed.  Patient discharged home in stable condition.  CONSULTS OBTAINED:     DRUG ALLERGIES:   Allergies  Allergen Reactions  . Penicillins Rash and Other (See Comments)    Patient states that reaction was 20 years ago, but cannot remember the specifics of the reaction    DISCHARGE MEDICATIONS:   Discharge Medication List as of 12/24/2015 11:03 AM    CONTINUE these medications which have NOT CHANGED   Details  ALPRAZolam (XANAX) 0.25 MG tablet Take 1 tablet (0.25 mg total) by mouth 2 (two) times daily as needed for anxiety., Starting 10/07/2015, Until Discontinued, Print    amitriptyline (ELAVIL) 25 MG tablet Take 1 tablet (25 mg total) by mouth at bedtime., Starting 12/09/2015, Until Discontinued, Normal    bismuth subsalicylate (PEPTO BISMOL) 262 MG chewable tablet Chew 524 mg by mouth as needed for indigestion. Reported on 12/21/2015, Until Discontinued, Historical Med    fentaNYL (DURAGESIC - DOSED MCG/HR) 12 MCG/HR Place 1 patch (12.5 mcg total) onto the skin every 3 (three) days. Use with 71mcg patch for total dose of 37.84mcg., Starting 12/16/2015, Until Discontinued, Print    fentaNYL (DURAGESIC - DOSED MCG/HR) 25 MCG/HR patch Place 1 patch (25 mcg total) onto the skin every 3 (three) days. Use with 12.82mcg patch for total dose 37.3mcg., Starting 12/16/2015, Until Discontinued, Print    fexofenadine (ALLEGRA) 180 MG tablet Take 180 mg by mouth daily as needed for allergies. , Until Discontinued, Historical Med  fluticasone (FLONASE) 50 MCG/ACT nasal spray Place 1-2 sprays into the nose daily as needed for allergies or rhinitis. , Starting 03/12/2013, Until Discontinued, Historical Med    HYDROcodone-acetaminophen (NORCO/VICODIN) 5-325 MG tablet Take 1 tablet by mouth every 6  (six) hours as needed for moderate pain., Starting 12/16/2015, Until Discontinued, Print    meclizine (ANTIVERT) 25 MG tablet Take 25 mg by mouth 4 (four) times daily as needed for dizziness., Until Discontinued, Historical Med    ondansetron (ZOFRAN) 4 MG tablet Take 1 tablet (4 mg total) by mouth every 6 (six) hours as needed., Starting 08/05/2015, Until Discontinued, Normal    potassium chloride (K-DUR) 10 MEQ tablet Take 1 tablet (10 mEq total) by mouth daily., Starting 10/23/2015, Until Discontinued, Normal    sertraline (ZOLOFT) 50 MG tablet Take 50 mg by mouth daily., Until Discontinued, Historical Med      STOP taking these medications     citalopram (CELEXA) 40 MG tablet      lidocaine-prilocaine (EMLA) cream      megestrol (MEGACE) 400 MG/10ML suspension      metoCLOPramide (REGLAN) 10 MG tablet      omeprazole (PRILOSEC) 20 MG capsule      predniSONE (DELTASONE) 10 MG tablet      Triamcinolone Acetonide (TRIAMCINOLONE 0.1 % CREAM : EUCERIN) CREA          Today    VITAL SIGNS:  Blood pressure 128/62, pulse 78, temperature 98.1 F (36.7 C), temperature source Oral, resp. rate 16, height 5\' 3"  (1.6 m), weight 56.609 kg (124 lb 12.8 oz), SpO2 97 %.  I/O:  No intake or output data in the 24 hours ending 12/25/15 1816  PHYSICAL EXAMINATION:  Physical Exam  GENERAL:  65 y.o.-year-old patient lying in the bed with no acute distress.  LUNGS: Normal breath sounds bilaterally, no wheezing, rales,rhonchi or crepitation. No use of accessory muscles of respiration.  CARDIOVASCULAR: S1, S2 normal. No murmurs, rubs, or gallops.  ABDOMEN: Soft, non-tender, non-distended. Bowel sounds present. No organomegaly or mass.  NEUROLOGIC: Moves all 4 extremities. PSYCHIATRIC: The patient is alert and oriented x 3.  SKIN: No obvious rash, lesion, or ulcer.   DATA REVIEW:   CBC  Recent Labs Lab 12/23/15 0450 12/24/15 0604  WBC 5.5  --   HGB 6.4* 9.5*  HCT 19.2*  --   PLT  225  --     Chemistries   Recent Labs Lab 12/23/15 0450  NA 135  K 3.5  CL 111  CO2 19*  GLUCOSE 89  BUN 18  CREATININE 1.54*  CALCIUM 7.8*    Cardiac Enzymes  Recent Labs Lab 12/21/15 1933  TROPONINI <0.03    Microbiology Results  Results for orders placed or performed in visit on 11/22/14  Culture, blood (single)     Status: None   Collection Time: 11/22/14  3:00 PM  Result Value Ref Range Status   Micro Text Report   Final       COMMENT                   NO GROWTH AEROBICALLY/ANAEROBICALLY IN 5 DAYS   ANTIBIOTIC                                                      Culture, blood (single)  Status: None   Collection Time: 11/22/14  3:10 PM  Result Value Ref Range Status   Micro Text Report   Final       COMMENT                   NO GROWTH AEROBICALLY/ANAEROBICALLY IN 5 DAYS   ANTIBIOTIC                                                      Urine culture     Status: None   Collection Time: 11/22/14  6:46 PM  Result Value Ref Range Status   Micro Text Report   Final       SOURCE: CLEAN CATCH    ORGANISM 1                1000 CFU/ML GRAM POSITIVE ROD   COMMENT                   -   ANTIBIOTIC                                                      Stool culture     Status: None   Collection Time: 11/25/14 12:46 PM  Result Value Ref Range Status   Micro Text Report   Final       COMMENT                   NO SALMONELLA OR SHIGELLA ISOLATED   COMMENT                   NO PATHOGENIC E.COLI DETECTED   COMMENT                   NO CAMPYLOBACTER ANTIGEN DETECTED   ANTIBIOTIC                                                        RADIOLOGY:  No results found.  Follow up with PCP in 1 week.  Management plans discussed with the patient, family and they are in agreement.  CODE STATUS:  Code Status History    Date Active Date Inactive Code Status Order ID Comments User Context   12/22/2015  1:21 AM 12/24/2015  2:33 PM Full Code [id number]  Harrie Foreman, MD Inpatient      TOTAL TIME TAKING CARE OF THIS PATIENT ON DAY OF DISCHARGE: more than 30 minutes.    Hillary Bow R M.D on 12/25/2015 at 6:16 PM  Between 7am to 6pm - Pager - [phone]  After 6pm go to [email or web address] - password [email or web address] [facility]  Office  [phone]  CC: Primary care physician; [name], MD   Note: This dictation was prepared with Dragon dictation along with smaller phrase technology. Any transcriptional errors that result from this process are unintentional.

## 2015-12-26 ENCOUNTER — Observation Stay
Admission: EM | Admit: 2015-12-26 | Discharge: 2015-12-28 | Disposition: A | Discharge status: Home / Self Care | Payer: Medicare Other | Attending: Internal Medicine | Admitting: Internal Medicine

## 2015-12-26 ENCOUNTER — Emergency Department: Payer: Medicare Other

## 2015-12-26 ENCOUNTER — Telehealth: Payer: Self-pay | Admitting: *Deleted

## 2015-12-26 ENCOUNTER — Encounter: Payer: Self-pay | Admitting: Emergency Medicine

## 2015-12-26 DIAGNOSIS — Z8249 Family history of ischemic heart disease and other diseases of the circulatory system: Secondary | ICD-10-CM | POA: Diagnosis not present

## 2015-12-26 DIAGNOSIS — R197 Diarrhea, unspecified: Secondary | ICD-10-CM | POA: Insufficient documentation

## 2015-12-26 DIAGNOSIS — Z90710 Acquired absence of both cervix and uterus: Secondary | ICD-10-CM | POA: Diagnosis not present

## 2015-12-26 DIAGNOSIS — K5900 Constipation, unspecified: Secondary | ICD-10-CM | POA: Insufficient documentation

## 2015-12-26 DIAGNOSIS — H5462 Unqualified visual loss, left eye, normal vision right eye: Secondary | ICD-10-CM | POA: Diagnosis present

## 2015-12-26 DIAGNOSIS — M50321 Other cervical disc degeneration at C4-C5 level: Secondary | ICD-10-CM | POA: Diagnosis not present

## 2015-12-26 DIAGNOSIS — Z880 Allergy status to penicillin: Secondary | ICD-10-CM | POA: Diagnosis not present

## 2015-12-26 DIAGNOSIS — Z79891 Long term (current) use of opiate analgesic: Secondary | ICD-10-CM | POA: Insufficient documentation

## 2015-12-26 DIAGNOSIS — Z79899 Other long term (current) drug therapy: Secondary | ICD-10-CM | POA: Diagnosis not present

## 2015-12-26 DIAGNOSIS — K567 Ileus, unspecified: Secondary | ICD-10-CM | POA: Diagnosis present

## 2015-12-26 DIAGNOSIS — R42 Dizziness and giddiness: Secondary | ICD-10-CM | POA: Diagnosis not present

## 2015-12-26 DIAGNOSIS — Z8542 Personal history of malignant neoplasm of other parts of uterus: Secondary | ICD-10-CM | POA: Diagnosis not present

## 2015-12-26 DIAGNOSIS — Z9889 Other specified postprocedural states: Secondary | ICD-10-CM | POA: Diagnosis not present

## 2015-12-26 DIAGNOSIS — Z7951 Long term (current) use of inhaled steroids: Secondary | ICD-10-CM | POA: Diagnosis not present

## 2015-12-26 DIAGNOSIS — E860 Dehydration: Secondary | ICD-10-CM | POA: Diagnosis not present

## 2015-12-26 DIAGNOSIS — G8929 Other chronic pain: Secondary | ICD-10-CM | POA: Diagnosis not present

## 2015-12-26 DIAGNOSIS — H538 Other visual disturbances: Secondary | ICD-10-CM | POA: Diagnosis not present

## 2015-12-26 DIAGNOSIS — H532 Diplopia: Secondary | ICD-10-CM | POA: Insufficient documentation

## 2015-12-26 DIAGNOSIS — R1032 Left lower quadrant pain: Secondary | ICD-10-CM | POA: Diagnosis not present

## 2015-12-26 DIAGNOSIS — F419 Anxiety disorder, unspecified: Secondary | ICD-10-CM | POA: Insufficient documentation

## 2015-12-26 DIAGNOSIS — N183 Chronic kidney disease, stage 3 (moderate): Secondary | ICD-10-CM | POA: Insufficient documentation

## 2015-12-26 LAB — BASIC METABOLIC PANEL
Anion gap: 9 (ref 5–15)
BUN: 18 mg/dL (ref 6–20)
CALCIUM: 8.8 mg/dL — AB (ref 8.9–10.3)
CHLORIDE: 100 mmol/L — AB (ref 101–111)
CO2: 25 mmol/L (ref 22–32)
CREATININE: 1.63 mg/dL — AB (ref 0.44–1.00)
GFR calc Af Amer: 37 mL/min — ABNORMAL LOW (ref >60)
GFR calc non Af Amer: 32 mL/min — ABNORMAL LOW (ref >60)
Glucose, Bld: 96 mg/dL (ref 65–99)
Potassium: 3.3 mmol/L — ABNORMAL LOW (ref 3.5–5.1)
SODIUM: 134 mmol/L — AB (ref 135–145)

## 2015-12-26 LAB — URINALYSIS COMPLETE WITH MICROSCOPIC (ARMC ONLY)
BILIRUBIN URINE: NEGATIVE
Bacteria, UA: NONE SEEN
Glucose, UA: NEGATIVE mg/dL
Hgb urine dipstick: NEGATIVE
KETONES UR: NEGATIVE mg/dL
LEUKOCYTES UA: NEGATIVE
Nitrite: NEGATIVE
Protein, ur: NEGATIVE mg/dL
RBC / HPF: NONE SEEN RBC/hpf (ref 0–5)
SQUAMOUS EPITHELIAL / LPF: NONE SEEN
Specific Gravity, Urine: 1.002 — ABNORMAL LOW (ref 1.005–1.030)
pH: 6 (ref 5.0–8.0)

## 2015-12-26 LAB — HEPATIC FUNCTION PANEL
ALK PHOS: 152 U/L — AB (ref 38–126)
ALT: 17 U/L (ref 14–54)
AST: 17 U/L (ref 15–41)
Albumin: 3.2 g/dL — ABNORMAL LOW (ref 3.5–5.0)
BILIRUBIN DIRECT: 0.2 mg/dL (ref 0.1–0.5)
BILIRUBIN TOTAL: 0.6 mg/dL (ref 0.3–1.2)
Indirect Bilirubin: 0.4 mg/dL (ref 0.3–0.9)
Total Protein: 6.8 g/dL (ref 6.5–8.1)

## 2015-12-26 LAB — LACTIC ACID, PLASMA
Lactic Acid, Venous: 0.5 mmol/L (ref 0.5–2.0)
Lactic Acid, Venous: 0.5 mmol/L (ref 0.5–2.0)

## 2015-12-26 LAB — CBC
HCT: 35.5 % (ref 35.0–47.0)
Hemoglobin: 11.8 g/dL — ABNORMAL LOW (ref 12.0–16.0)
MCH: 27.8 pg (ref 26.0–34.0)
MCHC: 33.2 g/dL (ref 32.0–36.0)
MCV: 83.7 fL (ref 80.0–100.0)
PLATELETS: 266 10*3/uL (ref 150–440)
RBC: 4.24 MIL/uL (ref 3.80–5.20)
RDW: 13.7 % (ref 11.5–14.5)
WBC: 7.7 10*3/uL (ref 3.6–11.0)

## 2015-12-26 LAB — TROPONIN I: Troponin I: 0.07 ng/mL — ABNORMAL HIGH (ref <0.031)

## 2015-12-26 MED ORDER — MECLIZINE HCL 25 MG PO TABS
25.0000 mg | ORAL_TABLET | Freq: Four times a day (QID) | ORAL | Status: DC | PRN
  Filled 2015-12-26: qty 1

## 2015-12-26 MED ORDER — FENTANYL 12 MCG/HR TD PT72
12.5000 ug | MEDICATED_PATCH | TRANSDERMAL | Status: DC
  Administered 2015-12-26: 12.5 ug via TRANSDERMAL
  Filled 2015-12-26: qty 1

## 2015-12-26 MED ORDER — ACETAMINOPHEN 325 MG PO TABS: 650.0000 mg | ORAL_TABLET | Freq: Four times a day (QID) | ORAL | Status: DC | PRN

## 2015-12-26 MED ORDER — ALBUTEROL SULFATE (2.5 MG/3ML) 0.083% IN NEBU: 2.5000 mg | INHALATION_SOLUTION | RESPIRATORY_TRACT | Status: DC | PRN

## 2015-12-26 MED ORDER — ACETAMINOPHEN 650 MG RE SUPP: 650.0000 mg | Freq: Four times a day (QID) | RECTAL | Status: DC | PRN

## 2015-12-26 MED ORDER — LORAZEPAM 2 MG/ML IJ SOLN
INTRAMUSCULAR | Status: AC
  Administered 2015-12-26: 1 mg via INTRAVENOUS
  Filled 2015-12-26: qty 1

## 2015-12-26 MED ORDER — FLUTICASONE PROPIONATE 50 MCG/ACT NA SUSP
2.0000 | Freq: Every day | NASAL | Status: DC | PRN
  Filled 2015-12-26: qty 16

## 2015-12-26 MED ORDER — FENTANYL 25 MCG/HR TD PT72
25.0000 ug | MEDICATED_PATCH | TRANSDERMAL | Status: DC
  Administered 2015-12-26: 23:00:00 25 ug via TRANSDERMAL
  Filled 2015-12-26: qty 1

## 2015-12-26 MED ORDER — ENOXAPARIN SODIUM 30 MG/0.3ML ~~LOC~~ SOLN
30.0000 mg | SUBCUTANEOUS | Status: DC
  Administered 2015-12-26: 30 mg via SUBCUTANEOUS
  Filled 2015-12-26: qty 0.3

## 2015-12-26 MED ORDER — SERTRALINE HCL 50 MG PO TABS
50.0000 mg | ORAL_TABLET | Freq: Every day | ORAL | Status: DC
  Administered 2015-12-27 – 2015-12-28 (×2): 50 mg via ORAL
  Filled 2015-12-26 (×2): qty 1

## 2015-12-26 MED ORDER — AMITRIPTYLINE HCL 25 MG PO TABS
25.0000 mg | ORAL_TABLET | Freq: Every day | ORAL | Status: DC
  Administered 2015-12-26 – 2015-12-27 (×2): 25 mg via ORAL
  Filled 2015-12-26 (×2): qty 1

## 2015-12-26 MED ORDER — DIPHENHYDRAMINE HCL 25 MG PO CAPS
25.0000 mg | ORAL_CAPSULE | Freq: Every day | ORAL | Status: DC
  Administered 2015-12-26 – 2015-12-27 (×2): 25 mg via ORAL
  Filled 2015-12-26 (×2): qty 1

## 2015-12-26 MED ORDER — POLYETHYLENE GLYCOL 3350 17 G PO PACK
17.0000 g | PACK | Freq: Every day | ORAL | Status: DC
  Administered 2015-12-26 – 2015-12-27 (×2): 17 g via ORAL
  Filled 2015-12-26 (×2): qty 1

## 2015-12-26 MED ORDER — SODIUM CHLORIDE 0.9 % IV SOLN
Freq: Once | INTRAVENOUS | Status: AC
  Administered 2015-12-26: 19:00:00 via INTRAVENOUS

## 2015-12-26 MED ORDER — POTASSIUM CHLORIDE IN NACL 20-0.9 MEQ/L-% IV SOLN
INTRAVENOUS | Status: DC
  Administered 2015-12-26 – 2015-12-27 (×2): via INTRAVENOUS
  Filled 2015-12-26 (×4): qty 1000

## 2015-12-26 MED ORDER — BISACODYL 10 MG RE SUPP
10.0000 mg | Freq: Every day | RECTAL | Status: DC | PRN
  Filled 2015-12-26: qty 1

## 2015-12-26 MED ORDER — SODIUM CHLORIDE 0.9 % IV BOLUS (SEPSIS)
1000.0000 mL | Freq: Once | INTRAVENOUS | Status: AC
  Administered 2015-12-26: 1000 mL via INTRAVENOUS

## 2015-12-26 MED ORDER — LORAZEPAM 2 MG/ML IJ SOLN
INTRAMUSCULAR | Status: AC
  Filled 2015-12-26: qty 1

## 2015-12-26 MED ORDER — HYDROCODONE-ACETAMINOPHEN 5-325 MG PO TABS
1.0000 | ORAL_TABLET | Freq: Four times a day (QID) | ORAL | Status: DC | PRN
  Administered 2015-12-26: 1 via ORAL
  Filled 2015-12-26: qty 1

## 2015-12-26 MED ORDER — LORAZEPAM 2 MG/ML IJ SOLN
1.0000 mg | Freq: Once | INTRAMUSCULAR | Status: AC
  Administered 2015-12-26: 1 mg via INTRAVENOUS

## 2015-12-26 MED ORDER — ONDANSETRON HCL 4 MG/2ML IJ SOLN
4.0000 mg | Freq: Four times a day (QID) | INTRAMUSCULAR | Status: DC | PRN
  Administered 2015-12-27 (×2): 4 mg via INTRAVENOUS
  Filled 2015-12-26 (×2): qty 2

## 2015-12-26 MED ORDER — POTASSIUM CHLORIDE CRYS ER 10 MEQ PO TBCR
10.0000 meq | EXTENDED_RELEASE_TABLET | Freq: Every day | ORAL | Status: DC
  Administered 2015-12-27 – 2015-12-28 (×2): 10 meq via ORAL
  Filled 2015-12-26 (×2): qty 1

## 2015-12-26 MED ORDER — METOCLOPRAMIDE HCL 10 MG PO TABS
10.0000 mg | ORAL_TABLET | Freq: Four times a day (QID) | ORAL | Status: DC | PRN
  Administered 2015-12-27: 10 mg via ORAL
  Filled 2015-12-26: qty 1

## 2015-12-26 MED ORDER — ONDANSETRON HCL 4 MG PO TABS: 4.0000 mg | ORAL_TABLET | Freq: Four times a day (QID) | ORAL | Status: DC | PRN

## 2015-12-26 MED ORDER — ALPRAZOLAM 0.25 MG PO TABS
0.2500 mg | ORAL_TABLET | Freq: Two times a day (BID) | ORAL | Status: DC | PRN
Start: 2015-12-26 — End: 2015-12-27
  Administered 2015-12-27: 08:00:00 0.25 mg via ORAL
  Filled 2015-12-26: qty 1

## 2015-12-26 MED ORDER — LACTULOSE 10 GM/15ML PO SOLN
30.0000 g | Freq: Every day | ORAL | Status: DC
  Administered 2015-12-26: 23:00:00 30 g via ORAL
  Filled 2015-12-26 (×2): qty 60

## 2015-12-26 MED ORDER — FLEET ENEMA 7-19 GM/118ML RE ENEM: 1.0000 | ENEMA | Freq: Once | RECTAL | Status: DC | PRN

## 2015-12-26 NOTE — ED Notes (Signed)
Called lab as to status of pt's troponin. Lab reported sample had not been run. Lab representative stated she would run the sample

## 2015-12-26 NOTE — ED Notes (Signed)
MD at bedside. 

## 2015-12-26 NOTE — H&P (Signed)
[facility] NAME: [name] Graham    MR#:  [id number]  DATE OF BIRTH:  07/15/1951  DATE OF ADMISSION:  12/26/2015  PRIMARY CARE PHYSICIAN: [name], MD   REQUESTING/REFERRING PHYSICIAN: Dr. Cinda Quest  CHIEF COMPLAINT:   Chief Complaint  Patient presents with  . Dizziness    HISTORY OF PRESENT ILLNESS:  [name] Graham  is a 65 y.o. female with a known history of uterine cancer, dizziness, CKD stage III, chronic kidney disease and recurrent obstipation presents to the emergency room complaining of worsening dizziness and constipation. Patient was recently in the hospital for ileus and dehydration. She received fluids and an edema and 2 units packed RBC and her symptoms improved and was discharged home. Since leaving the hospital patient did not have any bowel movements she tried Colace daily one dose of MiraLAX and with worsening symptoms has returned to the emergency room. X-rays show some air-fluid levels but no obstruction. She also seems dehydrated due to decreased oral intake and is being admitted to the hospitalist service. No fever or shortness of breath. She does have mild chronic lower abdominal pain which is to same.  PAST MEDICAL HISTORY:   Past Medical History  Diagnosis Date  . Cancer ([place]) 2012  . GI problem 2013  . Cancer of uterus ([place]) 04/18/2015  . Uterine cancer ([place])     PAST SURGICAL HISTORY:   Past Surgical History  Procedure Laterality Date  . Abdominal hysterectomy    . Tonsillectomy      SOCIAL HISTORY:   Social History  Substance Use Topics  . Smoking status: Never Smoker   . Smokeless tobacco: Never Used  . Alcohol Use: 0.0 oz/week    0 Standard drinks or equivalent per week    FAMILY HISTORY:   Family History  Problem Relation Age of Onset  . Hypertension Other     DRUG ALLERGIES:   Allergies  Allergen Reactions  . Penicillins Other (See Comments)    Reaction:   Unknown     REVIEW OF SYSTEMS:   Review of Systems  Constitutional: Positive for weight loss and malaise/fatigue. Negative for fever and chills.  HENT: Negative for hearing loss and nosebleeds.   Eyes: Positive for blurred vision (left). Negative for double vision and pain.  Respiratory: Negative for cough, hemoptysis, sputum production, shortness of breath and wheezing.   Cardiovascular: Negative for chest pain, palpitations, orthopnea and leg swelling.  Gastrointestinal: Positive for nausea, vomiting, abdominal pain and constipation. Negative for diarrhea.  Genitourinary: Negative for dysuria and hematuria.  Musculoskeletal: Negative for myalgias, back pain and falls.  Skin: Negative for rash.  Neurological: Positive for weakness. Negative for dizziness, tremors, sensory change, speech change, focal weakness, seizures and headaches.  Endo/Heme/Allergies: Does not bruise/bleed easily.  Psychiatric/Behavioral: Negative for depression and memory loss. The patient is nervous/anxious.     MEDICATIONS AT HOME:   Prior to Admission medications   Medication Sig Start Date End Date Taking? Authorizing Provider  ALPRAZolam (XANAX) 0.25 MG tablet Take 1 tablet (0.25 mg total) by mouth 2 (two) times daily as needed for anxiety. 10/07/15  Yes Forest Gleason, MD  amitriptyline (ELAVIL) 25 MG tablet Take 1 tablet (25 mg total) by mouth at bedtime. 12/09/15  Yes Forest Gleason, MD  Ca Carbonate-Mag Hydroxide (ROLAIDS) 550-110 MG CHEW Chew 2 tablets by mouth as needed (for indigestion).   Yes Historical Provider, MD  diphenhydrAMINE (BENADRYL) 25 MG tablet Take 25 mg  by mouth at bedtime.   Yes Historical Provider, MD  fentaNYL (DURAGESIC - DOSED MCG/HR) 12 MCG/HR Place 1 patch (12.5 mcg total) onto the skin every 3 (three) days. Use with 49mcg patch for total dose of 37.38mcg. 12/16/15  Yes Evlyn Kanner, NP  fentaNYL (DURAGESIC - DOSED MCG/HR) 25 MCG/HR patch Place 1 patch (25 mcg total) onto the skin every  3 (three) days. Use with 12.59mcg patch for total dose 37.56mcg. 12/16/15  Yes Evlyn Kanner, NP  fexofenadine (ALLEGRA) 180 MG tablet Take 180 mg by mouth daily.    Yes Historical Provider, MD  fluticasone (FLONASE) 50 MCG/ACT nasal spray Place 1-2 sprays into the nose daily as needed for allergies or rhinitis.  03/12/13  Yes Historical Provider, MD  HYDROcodone-acetaminophen (NORCO/VICODIN) 5-325 MG tablet Take 1 tablet by mouth every 6 (six) hours as needed for moderate pain. 12/16/15  Yes Evlyn Kanner, NP  meclizine (ANTIVERT) 25 MG tablet Take 25 mg by mouth 4 (four) times daily as needed for dizziness.   Yes Historical Provider, MD  metoCLOPramide (REGLAN) 10 MG tablet Take 10 mg by mouth 4 (four) times daily as needed for nausea.   Yes Historical Provider, MD  ondansetron (ZOFRAN) 4 MG tablet Take 1 tablet (4 mg total) by mouth every 6 (six) hours as needed. Patient taking differently: Take 4 mg by mouth every 6 (six) hours as needed for nausea or vomiting.  08/05/15  Yes Forest Gleason, MD  potassium chloride (K-DUR) 10 MEQ tablet Take 1 tablet (10 mEq total) by mouth daily. 10/23/15  Yes Forest Gleason, MD  sertraline (ZOLOFT) 50 MG tablet Take 50 mg by mouth daily.   Yes Historical Provider, MD  triamcinolone cream (KENALOG) 0.1 % Apply 1 application topically 2 (two) times daily as needed (for itching).   Yes Historical Provider, MD      VITAL SIGNS:  Blood pressure 161/88, pulse 84, temperature 98.1 F (36.7 C), temperature source Oral, resp. rate 16, height 5\' 3"  (1.6 m), weight 56.246 kg (124 lb), SpO2 100 %.  PHYSICAL EXAMINATION:  Physical Exam  GENERAL:  65 y.o.-year-old patient lying in the bed with no acute distress.  EYES: Pupils equal, round, reactive to light and accommodation. No scleral icterus. Extraocular muscles intact.  HEENT: Head atraumatic, normocephalic. Oropharynx and nasopharynx clear. No oropharyngeal erythema, moist oral mucosa . Left blurred vision. NECK:   Supple, no jugular venous distention. No thyroid enlargement, no tenderness.  LUNGS: Normal breath sounds bilaterally, no wheezing, rales, rhonchi. No use of accessory muscles of respiration.  CARDIOVASCULAR: S1, S2 normal. No murmurs, rubs, or gallops.  ABDOMEN: Soft, nontender, nondistended. Bowel sounds decreased organomegaly or mass.  EXTREMITIES: No pedal edema, cyanosis, or clubbing. + 2 pedal & radial pulses b/l.   NEUROLOGIC: Cranial nerves II through XII are intact. No focal Motor or sensory deficits appreciated b/l PSYCHIATRIC: The patient is alert and oriented x 3. Good affect.  SKIN: No obvious rash, lesion, or ulcer.   LABORATORY PANEL:   CBC  Recent Labs Lab 12/26/15 1713  WBC 7.7  HGB 11.8*  HCT 35.5  PLT 266   ------------------------------------------------------------------------------------------------------------------  Chemistries   Recent Labs Lab 12/26/15 1713  NA 134*  K 3.3*  CL 100*  CO2 25  GLUCOSE 96  BUN 18  CREATININE 1.63*  CALCIUM 8.8*  AST 17  ALT 17  ALKPHOS 152*  BILITOT 0.6   ------------------------------------------------------------------------------------------------------------------  Cardiac Enzymes  Recent Labs Lab 12/26/15 1713  TROPONINI 0.07*   ------------------------------------------------------------------------------------------------------------------  RADIOLOGY:  Dg Abd Acute W/chest  12/26/2015  CLINICAL DATA:  65 year old female with history of left lower abdominal pain yesterday evening. EXAM: DG ABDOMEN ACUTE W/ 1V CHEST COMPARISON:  Chest x-ray 12/21/2015. Abdominal radiograph 11/25/2014. FINDINGS: Lung volumes are normal. No consolidative airspace disease. No pleural effusions. No pneumothorax. No pulmonary nodule or mass noted. Pulmonary vasculature and the cardiomediastinal silhouette are within normal limits. Atherosclerosis in the thoracic aorta. Right internal jugular single-lumen porta cath with tip  terminating in the mid superior vena cava Gas, oral contrast material and stool are seen scattered throughout the colon extending to the level of the distal rectum. No pathologic distension of small bowel is noted. Several nondilated gas-filled loops of small bowel are noted in the central abdomen. A few small air-fluid levels are also noted on the upright projection. No gross evidence of pneumoperitoneum. IMPRESSION: 1. Nonspecific, but nonobstructive bowel gas pattern, as above. 2. No pneumoperitoneum. 3. No radiographic evidence of acute cardiopulmonary disease. 4. Atherosclerosis. Electronically Signed   By: Vinnie Langton M.D.   On: 12/26/2015 18:26     IMPRESSION AND PLAN:   * Dehydration due to decreased oral intake Bolus 1 L normal saline. Continue maintenance fluids. Patient does have dizziness which is likely from dehydration. This is positional.  * Left eye blurred vision 24 hour onset. Check MRI brain to rule out metastases from cancer.  * Severe constipation and early ileus This resolved within an enema during prior admission. I have ordered an enema. We'll also place her on scheduled lactulose and MiraLAX. She needs scheduled laxatives at discharge. She does take Colace at home which doesn't seem to be working.  * Uterine cancer   patient sees Dr. Oliva Bustard as outpatient.   * Chronic anemia needing as needed transfusions Stable hemoglobin   * DVT prophylaxis with Lovenox   All the records are reviewed and case discussed with ED provider. Management plans discussed with the patient, family and they are in agreement.  CODE STATUS: FULL  TOTAL TIME TAKING CARE OF THIS PATIENT: 40 minutes.    Hillary Bow R M.D on 12/26/2015 at 9:12 PM  Between 7am to 6pm - Pager - [phone]  After 6pm go to [email or web address] - password [email or web address] [facility]  Office  [phone]  CC: Primary care physician; [name], MD     Note: This dictation  was prepared with Dragon dictation along with smaller phrase technology. Any transcriptional errors that result from this process are unintentional.

## 2015-12-26 NOTE — Telephone Encounter (Signed)
Per L Herring, AGNP-C pt should go yo ER for evaluation. Pt informed and said she will go

## 2015-12-26 NOTE — Progress Notes (Signed)
Anticoagulation monitoring(Lovenox):  65 yo  ordered Lovenox 40 mg Q 24 hours for DVT prophylaxis  Filed Weights   12/26/15 1710  Weight: 124 lb (56.246 kg)   BMI 22  Lab Results  Component Value Date   CREATININE 1.63* 12/26/2015   CREATININE 1.54* 12/23/2015   CREATININE 1.85* 12/21/2015   Estimated Creatinine Clearance: 28.1 mL/min (by C-G formula based on Cr of 1.63). Hemoglobin & Hematocrit     Component Value Date/Time   HGB 11.8* 12/26/2015 1713   HGB 8.3* 03/26/2015 1508   HCT 35.5 12/26/2015 1713   HCT 25.7* 03/26/2015 1508     Per Protocol for Patient with estCrcl< 30 ml/min and BMI < 40, will transition to Lovenox 30 mg q 24 hours.  Lenis Noon, PharmD 9:57 PM

## 2015-12-26 NOTE — ED Notes (Addendum)
Was admitted to hospital until Wednesday with an ileus. She continues to have dizziness, lightheadedness, and SOB. 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 She was told by oncologist office to come due to possibility of another ileus. Pt also suffers from anxiety, and states that it makes her chest hurt. Anxiety meds not working to combat anxiety since last night. Pt has not been eating or drinking well for last 2 days.

## 2015-12-26 NOTE — ED Notes (Signed)
Was seen here on Sunday and was admitted with ileus. now states she is dizzy /light headed. conts to be constipated  Positive nausea

## 2015-12-26 NOTE — ED Notes (Signed)
Called [facility] for bed assignment  [phone]

## 2015-12-26 NOTE — Telephone Encounter (Addendum)
Concerned that she has not had a BM since she was discharged from hospital, She was in with an ileus and discharged on 1/18. States she is very anxious She has tried Office manager daily, Colace 2 yesterday and 3 today shealso has done a suppository yesterday adn today and only had very little results yesterday. Her chest is hurting from increased anxiety

## 2015-12-26 NOTE — ED Notes (Signed)
Pt reports her 'anxious feeling' has come back. MD notified

## 2015-12-26 NOTE — ED Provider Notes (Signed)
[facility] Emergency Department Provider Note  ____________________________________________  Time seen: Approximately 8:09 PM  I have reviewed the triage vital signs and the nursing notes.   HISTORY  Chief Complaint Dizziness    HPI [name] Graham is a 65 y.o. female patient returns after having been discharged a few days ago after recovering from her ileus and getting a blood transfusion. Patient reports she's a becoming very anxious and be feeling Achilles come back she is having very little stool come out feeling bloated and nauseated and not feeling hungry at all just like she did before when she had ileus. Patient has uterine cancer which is metastasized and is spreading.   Past Medical History  Diagnosis Date  . Cancer ([place]) 2012  . GI problem 2013  . Cancer of uterus ([place]) 04/18/2015  . Uterine cancer [facility])     Patient Active Problem List   Diagnosis Date Noted  . Ileus ([place]) 12/22/2015  . Cancer of uterus ([place]) 04/18/2015  . 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 Clinical depression 05/20/2014  . Absolute anemia 05/20/2014  . History of endometrial cancer 04/16/2013  . Lymphadenopathy, cervical 04/16/2013    Past Surgical History  Procedure Laterality Date  . Abdominal hysterectomy    . Tonsillectomy      Current Outpatient Rx  Name  Route  Sig  Dispense  Refill  . ALPRAZolam (XANAX) 0.25 MG tablet   Oral   Take 1 tablet (0.25 mg total) by mouth 2 (two) times daily as needed for anxiety.   60 tablet   3   . amitriptyline (ELAVIL) 25 MG tablet   Oral   Take 1 tablet (25 mg total) by mouth at bedtime.   30 tablet   3   . Ca Carbonate-Mag Hydroxide (ROLAIDS) 550-110 MG CHEW   Oral   Chew 2 tablets by mouth as needed (for indigestion).         . diphenhydrAMINE (BENADRYL) 25 MG tablet   Oral   Take 25 mg by mouth at bedtime.         . fentaNYL (DURAGESIC - DOSED MCG/HR) 12 MCG/HR   Transdermal  Place 1 patch (12.5 mcg total) onto the skin every 3 (three) days. Use with 64mcg patch for total dose of 37.79mcg.   10 patch   0   . fentaNYL (DURAGESIC - DOSED MCG/HR) 25 MCG/HR patch   Transdermal   Place 1 patch (25 mcg total) onto the skin every 3 (three) days. Use with 12.41mcg patch for total dose 37.6mcg.   10 patch   0   . fexofenadine (ALLEGRA) 180 MG tablet   Oral   Take 180 mg by mouth daily.          . fluticasone (FLONASE) 50 MCG/ACT nasal spray   Nasal   Place 1-2 sprays into the nose daily as needed for allergies or rhinitis.          Marland Kitchen HYDROcodone-acetaminophen (NORCO/VICODIN) 5-325 MG tablet   Oral   Take 1 tablet by mouth every 6 (six) hours as needed for moderate pain.   60 tablet   0   . meclizine (ANTIVERT) 25 MG tablet   Oral   Take 25 mg by mouth 4 (four) times daily as needed for dizziness.         . metoCLOPramide (REGLAN) 10 MG tablet   Oral   Take 10 mg by mouth 4 (four) times daily as needed for nausea.         Marland Kitchen  ondansetron (ZOFRAN) 4 MG tablet   Oral   Take 1 tablet (4 mg total) by mouth every 6 (six) hours as needed. Patient taking differently: Take 4 mg by mouth every 6 (six) hours as needed for nausea or vomiting.    60 tablet   3   . potassium chloride (K-DUR) 10 MEQ tablet   Oral   Take 1 tablet (10 mEq total) by mouth daily.   30 tablet   3   . sertraline (ZOLOFT) 50 MG tablet   Oral   Take 50 mg by mouth daily.         Marland Kitchen triamcinolone cream (KENALOG) 0.1 %   Topical   Apply 1 application topically 2 (two) times daily as needed (for itching).           Allergies Penicillins  No family history on file.  Social History Social History  Substance Use Topics  . Smoking status: Never Smoker   . Smokeless tobacco: Never Used  . Alcohol Use: Yes    Review of Systems Constitutional: No fever/chills Eyes: No visual changes. ENT: No sore throat. Cardiovascular: Denies chest pain. Respiratory: Denies  shortness of breath. Gastrointestinal:abdominal pain. nausea, no vomiting.  No diarrhea.  No constipation. Genitourinary: Negative for dysuria. Musculoskeletal: Negative for back pain. Skin: Negative for rash.  10-point ROS otherwise negative.  ____________________________________________   PHYSICAL EXAM:  VITAL SIGNS: ED Triage Vitals  Enc Vitals Group     BP 12/26/15 1710 123/87 mmHg     Pulse Rate 12/26/15 1710 111     Resp 12/26/15 1710 20     Temp 12/26/15 1710 97.9 F (36.6 C)     Temp Source 12/26/15 1710 Oral     SpO2 12/26/15 1710 98 %     Weight 12/26/15 1710 124 lb (56.246 kg)     Height 12/26/15 1710 5\' 3"  (1.6 m)     Head Cir --      Peak Flow --      Pain Score 12/26/15 1708 6     Pain Loc --      Pain Edu? --      Excl. in [place]? --     Constitutional: Alert and oriented. Looks chronically ill Eyes: Conjunctivae are normal. PERRL. EOMI. Head: Atraumatic. Nose: No congestion/rhinnorhea. Mouth/Throat: Mucous membranes are moist.  Oropharynx non-erythematous. Neck: No stridor.   Cardiovascular: Normal rate, regular rhythm. Grossly normal heart sounds.  Good peripheral circulation. Respiratory: Normal respiratory effort.  No retractions. Lungs CTAB. Gastrointestinal: Soft mild diffuse tenderness decreased bowel sounds No distention. No abdominal bruits. No CVA tenderness. }Musculoskeletal: No lower extremity tenderness nor edema.  No joint effusions. Neurologic:  Normal speech and language. No gross focal neurologic deficits are appreciated. No gait instability. Skin:  Skin is warm, dry and intact. No rash noted.  ____________________________________________   LABS (all labs ordered are listed, but only abnormal results are displayed)  Labs Reviewed  BASIC METABOLIC PANEL - Abnormal; Notable for the following:    Sodium 134 (*)    Potassium 3.3 (*)    Chloride 100 (*)    Creatinine, Ser 1.63 (*)    Calcium 8.8 (*)    GFR calc non Af Amer 32 (*)     GFR calc Af Amer 37 (*)    All other components within normal limits  CBC - Abnormal; Notable for the following:    Hemoglobin 11.8 (*)    All other components within normal limits  URINALYSIS COMPLETEWITH MICROSCOPIC (  [facility] ONLY) - Abnormal; Notable for the following:    Color, Urine STRAW (*)    APPearance CLEAR (*)    Specific Gravity, Urine 1.002 (*)    All other components within normal limits  HEPATIC FUNCTION PANEL  TROPONIN I  LACTIC ACID, PLASMA  LACTIC ACID, PLASMA   ____________________________________________  EKG  EKG read and interpreted by me shows normal sinus rhythm at a rate of 84 normal axis the baseline is irregular in the computer is reading nonspecific ST-T wave changes however do not see any marked abnormalities. ____________________________________________  RADIOLOGY  Acute abdominal series read by radiology and reviewed by me shows multiple air-fluid levels no obvious mechanical obstruction consistent with ileus. ____________________________________________   PROCEDURES Discussed with gastroenterology on-call who advises admission  ____________________________________________   INITIAL IMPRESSION / ASSESSMENT AND PLAN / ED COURSE  Pertinent labs & imaging results that were available during my care of the patient were reviewed by me and considered in my medical decision making (see chart for details).   ____________________________________________   FINAL CLINICAL IMPRESSION(S) / ED DIAGNOSES  Final diagnoses:  Ileus ([place])      Nena Polio, MD 12/26/15 2012

## 2015-12-27 ENCOUNTER — Observation Stay: Payer: Medicare Other

## 2015-12-27 ENCOUNTER — Other Ambulatory Visit: Payer: Self-pay | Admitting: Ophthalmology

## 2015-12-27 DIAGNOSIS — E860 Dehydration: Secondary | ICD-10-CM | POA: Diagnosis not present

## 2015-12-27 LAB — CBC
HCT: 31.4 % — ABNORMAL LOW (ref 35.0–47.0)
Hemoglobin: 10.5 g/dL — ABNORMAL LOW (ref 12.0–16.0)
MCH: 28.6 pg (ref 26.0–34.0)
MCHC: 33.4 g/dL (ref 32.0–36.0)
MCV: 85.4 fL (ref 80.0–100.0)
PLATELETS: 239 10*3/uL (ref 150–440)
RBC: 3.68 MIL/uL — ABNORMAL LOW (ref 3.80–5.20)
RDW: 13.8 % (ref 11.5–14.5)
WBC: 5.5 10*3/uL (ref 3.6–11.0)

## 2015-12-27 LAB — BASIC METABOLIC PANEL
Anion gap: 6 (ref 5–15)
BUN: 13 mg/dL (ref 6–20)
CO2: 23 mmol/L (ref 22–32)
CREATININE: 1.45 mg/dL — AB (ref 0.44–1.00)
Calcium: 8.3 mg/dL — ABNORMAL LOW (ref 8.9–10.3)
Chloride: 110 mmol/L (ref 101–111)
GFR calc Af Amer: 42 mL/min — ABNORMAL LOW (ref >60)
GFR, EST NON AFRICAN AMERICAN: 36 mL/min — AB (ref >60)
GLUCOSE: 78 mg/dL (ref 65–99)
Potassium: 3.3 mmol/L — ABNORMAL LOW (ref 3.5–5.1)
SODIUM: 139 mmol/L (ref 135–145)

## 2015-12-27 LAB — TROPONIN I
TROPONIN I: 0.04 ng/mL — AB (ref <0.031)
Troponin I: 0.06 ng/mL — ABNORMAL HIGH (ref <0.031)

## 2015-12-27 LAB — C-REACTIVE PROTEIN: CRP: 11.8 mg/dL — ABNORMAL HIGH (ref <1.0)

## 2015-12-27 LAB — SEDIMENTATION RATE: SED RATE: 51 mm/h — AB (ref 0–30)

## 2015-12-27 MED ORDER — ALPRAZOLAM 0.5 MG PO TABS
0.5000 mg | ORAL_TABLET | Freq: Two times a day (BID) | ORAL | Status: DC
  Administered 2015-12-27 – 2015-12-28 (×2): 0.5 mg via ORAL
  Filled 2015-12-27 (×2): qty 1

## 2015-12-27 MED ORDER — GADOBENATE DIMEGLUMINE 529 MG/ML IV SOLN
10.0000 mL | Freq: Once | INTRAVENOUS | Status: AC | PRN
  Administered 2015-12-27: 18:00:00 5 mL via INTRAVENOUS

## 2015-12-27 MED ORDER — ENOXAPARIN SODIUM 40 MG/0.4ML ~~LOC~~ SOLN
40.0000 mg | SUBCUTANEOUS | Status: DC
  Administered 2015-12-27: 40 mg via SUBCUTANEOUS
  Filled 2015-12-27: qty 0.4

## 2015-12-27 MED ORDER — ALPRAZOLAM 0.5 MG PO TABS
0.5000 mg | ORAL_TABLET | Freq: Once | ORAL | Status: AC
  Administered 2015-12-27: 14:00:00 0.5 mg via ORAL
  Filled 2015-12-27: qty 1

## 2015-12-27 MED ORDER — HYPROMELLOSE (GONIOSCOPIC) 2.5 % OP SOLN: 1.0000 [drp] | Freq: Three times a day (TID) | OPHTHALMIC | Status: DC

## 2015-12-27 MED ORDER — TRIAMCINOLONE ACETONIDE 0.1 % EX CREA
TOPICAL_CREAM | Freq: Two times a day (BID) | CUTANEOUS | Status: DC | PRN
  Administered 2015-12-27: 15:00:00 via TOPICAL
  Filled 2015-12-27: qty 15

## 2015-12-27 MED ORDER — DIPHENHYDRAMINE HCL 25 MG PO CAPS
25.0000 mg | ORAL_CAPSULE | Freq: Four times a day (QID) | ORAL | Status: DC | PRN
  Administered 2015-12-27: 15:00:00 25 mg via ORAL
  Filled 2015-12-27: qty 1

## 2015-12-27 NOTE — Plan of Care (Signed)
Problem: Safety: Goal: Ability to remain free from injury will improve Outcome: Progressing Pt remains free of injury. Instructed to call for assistance if needed.   Problem: Pain Managment: Goal: General experience of comfort will improve Outcome: Progressing PRN medication  Given HA. Pt verbalized understanding of need to call for medication if needed  Problem: Fluid Volume: Goal: Ability to maintain a balanced intake and output will improve Outcome: Progressing Pt admitted with dehydration.  IV fluids initiated as ordered.  Encouraged po intake of fluids

## 2015-12-27 NOTE — Evaluation (Signed)
Physical Therapy Evaluation Patient Details Name: [name] Graham MRN: [id number] DOB: July 18, 1951 Today's Date: 12/27/2015   History of Present Illness  Pt admitted for dehydration along with dizziness and L eye diplopia. Pt with recent hospital stay for ileus and dehydration. Pt with history of uterine cancer and CKD stage 3. Pt also with chronic R side chest port.  Clinical Impression  Pt is a pleasant 65 year old female who was admitted for dehydration. Pt demonstrates all bed mobility/transfers/ambulation at baseline level. Slight weakness noted, however appears to be secondary to dehydration as it is not focal in nature. No sensation deficits or balance impairments. No AD required for mobility. Encouraged pt to continue an active lifestyle and ambulate in hallway with RN staff. Pt does not require any further PT needs at this time. Pt will be dc in house and does not require follow up. RN aware. Will dc current orders.     Follow Up Recommendations No PT follow up    Equipment Recommendations       Recommendations for Other Services       Precautions / Restrictions Precautions Precautions: None Restrictions Weight Bearing Restrictions: No      Mobility  Bed Mobility Overal bed mobility: Independent             General bed mobility comments: safe technique performed  Transfers Overall transfer level: Independent Equipment used: None             General transfer comment: safe technique performed without AD. Pt reports slight dizziness with transfer  Ambulation/Gait Ambulation/Gait assistance: Supervision Ambulation Distance (Feet): 200 Feet Assistive device: None Gait Pattern/deviations: Step-through pattern     General Gait Details: ambulated using no AD and reciprocal gait pattern. Safe technique performed. Pt with good gait speed and no LOB  Stairs            Wheelchair Mobility    Modified Rankin (Stroke Patients Only)       Balance  Overall balance assessment: Independent                                           Pertinent Vitals/Pain Pain Assessment: No/denies pain    Home Living Family/patient expects to be discharged to:: Private residence Living Arrangements:  (significant other) Available Help at Discharge: Family Type of Home: House Home Access: Level entry     Home Layout: One level Home Equipment: None      Prior Function Level of Independence: Independent               Hand Dominance        Extremity/Trunk Assessment   Upper Extremity Assessment: Overall WFL for tasks assessed           Lower Extremity Assessment: Overall WFL for tasks assessed         Communication   Communication: No difficulties  Cognition Arousal/Alertness: Awake/alert Behavior During Therapy: WFL for tasks assessed/performed Overall Cognitive Status: Within Functional Limits for tasks assessed                      General Comments      Exercises        Assessment/Plan    PT Assessment Patent does not need any further PT services  PT Diagnosis     PT Problem List    PT Treatment Interventions  PT Goals (Current goals can be found in the Care Plan section) Acute Rehab PT Goals Patient Stated Goal: to go home PT Goal Formulation: All assessment and education complete, DC therapy Time For Goal Achievement: 12/27/15 Potential to Achieve Goals: Good    Frequency     Barriers to discharge        Co-evaluation               End of Session Equipment Utilized During Treatment: Gait belt Activity Tolerance: Patient tolerated treatment well Patient left: in bed;with family/visitor present Nurse Communication: Mobility status    Functional Assessment Tool Used: clinical judgement Functional Limitation: Mobility: Walking and moving around Mobility: Walking and Moving Around Current Status [phone]): 0 percent impaired, limited or restricted Mobility:  Walking and Moving Around Goal Status [phone]): 0 percent impaired, limited or restricted Mobility: Walking and Moving Around Discharge Status [phone]): 0 percent impaired, limited or restricted    Time: [id number] PT Time Calculation (min) (ACUTE ONLY): 11 min   Charges:   PT Evaluation $PT Eval Low Complexity: 1 Procedure     PT G Codes:   PT G-Codes **NOT FOR INPATIENT CLASS** Functional Assessment Tool Used: clinical judgement Functional Limitation: Mobility: Walking and moving around Mobility: Walking and Moving Around Current Status [id number]): 0 percent impaired, limited or restricted Mobility: Walking and Moving Around Goal Status [id number]): 0 percent impaired, limited or restricted Mobility: Walking and Moving Around Discharge Status [phone]): 0 percent impaired, limited or restricted    [name] Graham 12/27/2015, 4:17 PM  Greggory Stallion, PT, DPT [phone]

## 2015-12-27 NOTE — Consult Note (Signed)
Reason for Consult:L eye blurriness Referring Physician: [name] is an 65 y.o. female.  Chief complaint: L eye blurry  <principal problem not specified>  HPI: 65 yo WF admitted for constipation and weakness c/o 2 day h/o L eye blurriness and diplopia.  Acute onset - no improvement since onset.  +HA and weakness.  No scalp tenderness or jaw claudication.  Recently admitted to [facility] last week and readmitted yesterday.    Past Medical History  Diagnosis Date  . Cancer ([place]) 2012  . GI problem 2013  . Cancer of uterus ([place]) 04/18/2015  . Uterine cancer ([facility])   POH - unremarkable  ROS +HA, weakness, dizziness.  +constipation, now with diarrhea.  No CP/SOB Past Surgical History  Procedure Laterality Date  . Abdominal hysterectomy    . Tonsillectomy      Family History  Problem Relation Age of Onset  . Hypertension Other   [place]- negative  Social History:  reports that she has never smoked. She has never used smokeless tobacco. She reports that she drinks alcohol. She reports that she does not use illicit drugs.  Allergies:  Allergies  Allergen Reactions  . Penicillins Other (See Comments)    Reaction:  Unknown     Medications:  I have reviewed the patient's current medications. gtts- none Prior to Admission:  Prescriptions prior to admission  Medication Sig Dispense Refill Last Dose  . ALPRAZolam (XANAX) 0.25 MG tablet Take 1 tablet (0.25 mg total) by mouth 2 (two) times daily as needed for anxiety. 60 tablet 3 12/26/2015 at Unknown time  . amitriptyline (ELAVIL) 25 MG tablet Take 1 tablet (25 mg total) by mouth at bedtime. 30 tablet 3 12/25/2015 at Unknown time  . Ca Carbonate-Mag Hydroxide (ROLAIDS) 550-110 MG CHEW Chew 2 tablets by mouth as needed (for indigestion).   12/25/2015 at Unknown time  . diphenhydrAMINE (BENADRYL) 25 MG tablet Take 25 mg by mouth at bedtime.   12/25/2015 at Unknown time  . fentaNYL (DURAGESIC - DOSED MCG/HR) 12 MCG/HR Place 1 patch  (12.5 mcg total) onto the skin every 3 (three) days. Use with 19mg patch for total dose of 37.5924m. 10 patch 0 12/24/2015 at unknown  . fentaNYL (DURAGESIC - DOSED MCG/HR) 25 MCG/HR patch Place 1 patch (25 mcg total) onto the skin every 3 (three) days. Use with 12.24m24mpatch for total dose 37.24mc60m10 patch 0 12/24/2015 at unknown  . fexofenadine (ALLEGRA) 180 MG tablet Take 180 mg by mouth daily.    12/26/2015 at Unknown time  . fluticasone (FLONASE) 50 MCG/ACT nasal spray Place 1-2 sprays into the nose daily as needed for allergies or rhinitis.    PRN at PRN  . HYDROcodone-acetaminophen (NORCO/VICODIN) 5-325 MG tablet Take 1 tablet by mouth every 6 (six) hours as needed for moderate pain. 60 tablet 0 12/26/2015 at 1400  . meclizine (ANTIVERT) 25 MG tablet Take 25 mg by mouth 4 (four) times daily as needed for dizziness.   PRN at PRN  . metoCLOPramide (REGLAN) 10 MG tablet Take 10 mg by mouth 4 (four) times daily as needed for nausea.   PRN at PRN  . ondansetron (ZOFRAN) 4 MG tablet Take 1 tablet (4 mg total) by mouth every 6 (six) hours as needed. (Patient taking differently: Take 4 mg by mouth every 6 (six) hours as needed for nausea or vomiting. ) 60 tablet 3 12/26/2015 at Unknown time  . potassium chloride (K-DUR) 10 MEQ tablet Take 1 tablet (10 mEq total) by  mouth daily. 30 tablet 3 12/26/2015 at Unknown time  . sertraline (ZOLOFT) 50 MG tablet Take 50 mg by mouth daily.   12/26/2015 at Unknown time  . triamcinolone cream (KENALOG) 0.1 % Apply 1 application topically 2 (two) times daily as needed (for itching).   12/26/2015 at Unknown time    Results for orders placed or performed during the hospital encounter of 12/26/15 (from the past 48 hour(s))  Basic metabolic panel     Status: Abnormal   Collection Time: 12/26/15  5:13 PM  Result Value Ref Range   Sodium 134 (L) 135 - 145 mmol/L   Potassium 3.3 (L) 3.5 - 5.1 mmol/L   Chloride 100 (L) 101 - 111 mmol/L   CO2 25 22 - 32 mmol/L   Glucose, Bld  96 65 - 99 mg/dL   BUN 18 6 - 20 mg/dL   Creatinine, Ser 1.63 (H) 0.44 - 1.00 mg/dL   Calcium 8.8 (L) 8.9 - 10.3 mg/dL   GFR calc non Af Amer 32 (L) >60 mL/min   GFR calc Af Amer 37 (L) >60 mL/min    Comment: (NOTE) The eGFR has been calculated using the CKD EPI equation. This calculation has not been validated in all clinical situations. eGFR's persistently <60 mL/min signify possible Chronic Kidney Disease.    Anion gap 9 5 - 15  CBC     Status: Abnormal   Collection Time: 12/26/15  5:13 PM  Result Value Ref Range   WBC 7.7 3.6 - 11.0 K/uL   RBC 4.24 3.80 - 5.20 MIL/uL   Hemoglobin 11.8 (L) 12.0 - 16.0 g/dL   HCT 35.5 35.0 - 47.0 %   MCV 83.7 80.0 - 100.0 fL   MCH 27.8 26.0 - 34.0 pg   MCHC 33.2 32.0 - 36.0 g/dL   RDW 13.7 11.5 - 14.5 %   Platelets 266 150 - 440 K/uL  Urinalysis complete, with microscopic ([facility] only)     Status: Abnormal   Collection Time: 12/26/15  5:13 PM  Result Value Ref Range   Color, Urine STRAW (A) YELLOW   APPearance CLEAR (A) CLEAR   Glucose, UA NEGATIVE NEGATIVE mg/dL   Bilirubin Urine NEGATIVE NEGATIVE   Ketones, ur NEGATIVE NEGATIVE mg/dL   Specific Gravity, Urine 1.002 (L) 1.005 - 1.030   Hgb urine dipstick NEGATIVE NEGATIVE   pH 6.0 5.0 - 8.0   Protein, ur NEGATIVE NEGATIVE mg/dL   Nitrite NEGATIVE NEGATIVE   Leukocytes, UA NEGATIVE NEGATIVE   RBC / HPF NONE SEEN 0 - 5 RBC/hpf   WBC, UA 0-5 0 - 5 WBC/hpf   Bacteria, UA NONE SEEN NONE SEEN   Squamous Epithelial / LPF NONE SEEN NONE SEEN  Hepatic function panel     Status: Abnormal   Collection Time: 12/26/15  5:13 PM  Result Value Ref Range   Total Protein 6.8 6.5 - 8.1 g/dL   Albumin 3.2 (L) 3.5 - 5.0 g/dL   AST 17 15 - 41 U/L   ALT 17 14 - 54 U/L   Alkaline Phosphatase 152 (H) 38 - 126 U/L   Total Bilirubin 0.6 0.3 - 1.2 mg/dL   Bilirubin, Direct 0.2 0.1 - 0.5 mg/dL   Indirect Bilirubin 0.4 0.3 - 0.9 mg/dL  Troponin I     Status: Abnormal   Collection Time: 12/26/15  5:13 PM   Result Value Ref Range   Troponin I 0.07 (H) <0.031 ng/mL    Comment: READ BACK AND VERIFIED WITH JENNA  ELLINGTON ON 12/26/15 AT 2103 BY TLB        PERSISTENTLY INCREASED TROPONIN VALUES IN THE RANGE OF 0.04-0.49 ng/mL CAN BE SEEN IN:       -UNSTABLE ANGINA       -CONGESTIVE HEART FAILURE       -MYOCARDITIS       -CHEST TRAUMA       -ARRYHTHMIAS       -LATE PRESENTING MYOCARDIAL INFARCTION       -COPD   CLINICAL FOLLOW-UP RECOMMENDED.   Lactic acid, plasma     Status: None   Collection Time: 12/26/15  7:50 PM  Result Value Ref Range   Lactic Acid, Venous 0.5 0.5 - 2.0 mmol/L  Lactic acid, plasma     Status: None   Collection Time: 12/26/15 11:05 PM  Result Value Ref Range   Lactic Acid, Venous 0.5 0.5 - 2.0 mmol/L  Troponin I (q 6hr x 3)     Status: Abnormal   Collection Time: 12/27/15 12:20 AM  Result Value Ref Range   Troponin I 0.06 (H) <0.031 ng/mL    Comment: PREVIOUS RESULT CALLED AT 2103 12/26/15.PMH        PERSISTENTLY INCREASED TROPONIN VALUES IN THE RANGE OF 0.04-0.49 ng/mL CAN BE SEEN IN:       -UNSTABLE ANGINA       -CONGESTIVE HEART FAILURE       -MYOCARDITIS       -CHEST TRAUMA       -ARRYHTHMIAS       -LATE PRESENTING MYOCARDIAL INFARCTION       -COPD   CLINICAL FOLLOW-UP RECOMMENDED.   Basic metabolic panel     Status: Abnormal   Collection Time: 12/27/15  5:33 AM  Result Value Ref Range   Sodium 139 135 - 145 mmol/L   Potassium 3.3 (L) 3.5 - 5.1 mmol/L   Chloride 110 101 - 111 mmol/L   CO2 23 22 - 32 mmol/L   Glucose, Bld 78 65 - 99 mg/dL   BUN 13 6 - 20 mg/dL   Creatinine, Ser 1.45 (H) 0.44 - 1.00 mg/dL   Calcium 8.3 (L) 8.9 - 10.3 mg/dL   GFR calc non Af Amer 36 (L) >60 mL/min   GFR calc Af Amer 42 (L) >60 mL/min    Comment: (NOTE) The eGFR has been calculated using the CKD EPI equation. This calculation has not been validated in all clinical situations. eGFR's persistently <60 mL/min signify possible Chronic Kidney Disease.    Anion  gap 6 5 - 15  CBC     Status: Abnormal   Collection Time: 12/27/15  5:33 AM  Result Value Ref Range   WBC 5.5 3.6 - 11.0 K/uL   RBC 3.68 (L) 3.80 - 5.20 MIL/uL   Hemoglobin 10.5 (L) 12.0 - 16.0 g/dL   HCT 31.4 (L) 35.0 - 47.0 %   MCV 85.4 80.0 - 100.0 fL   MCH 28.6 26.0 - 34.0 pg   MCHC 33.4 32.0 - 36.0 g/dL   RDW 13.8 11.5 - 14.5 %   Platelets 239 150 - 440 K/uL  Troponin I (q 6hr x 3)     Status: Abnormal   Collection Time: 12/27/15  5:33 AM  Result Value Ref Range   Troponin I 0.04 (H) <0.031 ng/mL    Comment: PREVIOUS RESULT CALLED '@2103'$  ON 12/26/15 BY TLB.Marland KitchenHP        PERSISTENTLY INCREASED TROPONIN VALUES IN THE RANGE OF 0.04-0.49 ng/mL CAN BE SEEN  IN:       -UNSTABLE ANGINA       -CONGESTIVE HEART FAILURE       -MYOCARDITIS       -CHEST TRAUMA       -ARRYHTHMIAS       -LATE PRESENTING MYOCARDIAL INFARCTION       -COPD   CLINICAL FOLLOW-UP RECOMMENDED.   Sedimentation rate     Status: Abnormal   Collection Time: 12/27/15  2:00 PM  Result Value Ref Range   Sed Rate 51 (H) 0 - 30 mm/hr    Dg Abd Acute W/chest  12/26/2015  CLINICAL DATA:  65 year old female with history of left lower abdominal pain yesterday evening. EXAM: DG ABDOMEN ACUTE W/ 1V CHEST COMPARISON:  Chest x-ray 12/21/2015. Abdominal radiograph 11/25/2014. FINDINGS: Lung volumes are normal. No consolidative airspace disease. No pleural effusions. No pneumothorax. No pulmonary nodule or mass noted. Pulmonary vasculature and the cardiomediastinal silhouette are within normal limits. Atherosclerosis in the thoracic aorta. Right internal jugular single-lumen porta cath with tip terminating in the mid superior vena cava Gas, oral contrast material and stool are seen scattered throughout the colon extending to the level of the distal rectum. No pathologic distension of small bowel is noted. Several nondilated gas-filled loops of small bowel are noted in the central abdomen. A few small air-fluid levels are also noted on  the upright projection. No gross evidence of pneumoperitoneum. IMPRESSION: 1. Nonspecific, but nonobstructive bowel gas pattern, as above. 2. No pneumoperitoneum. 3. No radiographic evidence of acute cardiopulmonary disease. 4. Atherosclerosis. Electronically Signed   By: Vinnie Langton M.D.   On: 12/26/2015 18:26    Blood pressure 144/75, pulse 94, temperature 98 F (36.7 C), temperature source Oral, resp. rate 20, height 5' 3" (1.6 m), weight 55.067 kg (121 lb 6.4 oz), SpO2 97 %.  Mental status: Alert and Oriented x 4  Visual Acuity:  20/100 OD  20/100 near cc  Pupils:  Equally round/ reactive to light.  No Afferent defect.  Motility:  Full/ orthophoric  Visual Fields:  Full to confrontation OU  IOP:  14 OD/ 13 OS tonopen  External/ Lids/ Lashes:  Normal  Anterior Segment:  Conjunctiva:  Normal  OU  Cornea:  Normal  OU  Anterior Chamber: Normal  OU  Lens:   Normal OU  Posterior Segment: Dilated OU with 1% Tropicamide and 2.5% Phenylephrine  Discs:   Normal c/d ratio, no pallor, no edema OU  Macula:  Normal  Vessels/ Periphery: Normal    Assessment/Plan: Blurry vision left eye- ESR elevated to 51.  Await CRP.  If CRP normal, would not pursue diagnosis of Temporal arteritis.  If both ESR and CRP elevated, would pursue TA biopsy to evaluate. No signs of a vascular occlusion (BRAO, CRAO, or ischemic optic neuropathy) or retinal detachment. Await MRI.  Will treat for dry eyes, as ocular surface disease frequently causes transient blurriness.  F/u at [facility] center approx 1-2 weeks after discharge.   Simmie Garin 12/27/2015, 3:18 PM

## 2015-12-27 NOTE — Progress Notes (Signed)
Patient ID: Havilah Topor, female   DOB: September 24, 1951, 65 y.o.   MRN: [id number] [facility] PROGRESS NOTE  Rolena Knutson YSA:[id number] DOB: [birth year] DOA: 12/26/2015 PCP: [name], MD  HPI/Subjective: Patient asking when she can go home. She is very anxious and very tearful when I was talking with her. She refused to speak with a psychiatrist. Brita Romp been having double vision with her left eye for the past few days. No pain in the eye. She also was admitted with constipation and dehydration. She states that she cannot eat. As per the son she does drink very well. She had about 8 bowel movements of diarrhea at this point. Patient feels nauseous but no vomiting. Some abdominal discomfort.  Objective: Filed Vitals:   12/26/15 2256 12/27/15 0507  BP: 157/75 149/75  Pulse: 85 84  Temp: 98.1 F (36.7 C) 98.4 F (36.9 C)  Resp: 18 18    Filed Weights   12/26/15 1710 12/26/15 2256  Weight: 56.246 kg (124 lb) 55.067 kg (121 lb 6.4 oz)    ROS: Review of Systems  Constitutional: Negative for fever and chills.  Eyes: Negative for blurred vision.  Respiratory: Negative for cough and shortness of breath.   Cardiovascular: Negative for chest pain.  Gastrointestinal: Positive for nausea, abdominal pain and diarrhea. Negative for vomiting and constipation.  Genitourinary: Negative for dysuria.  Musculoskeletal: Negative for joint pain.  Neurological: Negative for dizziness and headaches.   Exam: Physical Exam  Constitutional: She is oriented to person, place, and time.  HENT:  Nose: No mucosal edema.  Mouth/Throat: No oropharyngeal exudate or posterior oropharyngeal edema.  Eyes: Conjunctivae, EOM and lids are normal. Pupils are equal, round, and reactive to light.  Vision left eye 20/50. Vision right eye 20/50. Both eyes vision 20/40. All these measurements are 2 reading chart.  Neck: No JVD present. Carotid bruit is not present. No edema present. No thyroid mass and  no thyromegaly present.  Cardiovascular: S1 normal and S2 normal.  Exam reveals no gallop.   Murmur heard.  Systolic murmur is present with a grade of 2/6  Pulses:      Dorsalis pedis pulses are 2+ on the right side, and 2+ on the left side.  Respiratory: No respiratory distress. She has no wheezes. She has no rhonchi. She has no rales.  GI: Soft. Bowel sounds are normal. There is no tenderness.  Musculoskeletal:       Right ankle: She exhibits no swelling.       Left ankle: She exhibits no swelling.  Lymphadenopathy:    She has no cervical adenopathy.  Neurological: She is alert and oriented to person, place, and time. No cranial nerve deficit.  Skin: Skin is warm. No rash noted. Nails show no clubbing.  Psychiatric: She has a normal mood and affect.      Data Reviewed: Basic Metabolic Panel:  Recent Labs Lab 12/21/15 1637 12/23/15 0450 12/26/15 1713 12/27/15 0533  NA 132* 135 134* 139  K 4.0 3.5 3.3* 3.3*  CL 101 111 100* 110  CO2 21* 19* 25 23  GLUCOSE 101* 89 96 78  BUN 28* _0 CREATININE 1.85* 1.54* 1.63* 1.45*  CALCIUM 9.1 7.8* 8.8* 8.3*   Liver Function Tests:  Recent Labs Lab 12/26/15 1713  AST 17  ALT 17  ALKPHOS 152*  BILITOT 0.6  PROT 6.8  ALBUMIN 3.2*   CBC:  Recent Labs Lab 12/21/15 1637 12/23/15 0450 12/24/15 0604 12/26/15 1713 12/27/15 0533  WBC 6.5 5.5  --  7.7 5.5  HGB 9.0* 6.4* 9.5* 11.8* 10.5*  HCT 27.7* 19.2*  --  35.5 31.4*  MCV 85.5 85.4  --  83.7 85.4  PLT 320 225  --  266 239   Cardiac Enzymes:  Recent Labs Lab 12/21/15 1933 12/26/15 1713 12/27/15 0020 12/27/15 0533  TROPONINI <0.03 0.07* 0.06* 0.04*     Studies: Dg Abd Acute W/chest  12/26/2015  CLINICAL DATA:  65 year old female with history of left lower abdominal pain yesterday evening. EXAM: DG ABDOMEN ACUTE W/ 1V CHEST COMPARISON:  Chest x-ray 12/21/2015. Abdominal radiograph 11/25/2014. FINDINGS: Lung volumes are normal. No consolidative airspace  disease. No pleural effusions. No pneumothorax. No pulmonary nodule or mass noted. Pulmonary vasculature and the cardiomediastinal silhouette are within normal limits. Atherosclerosis in the thoracic aorta. Right internal jugular single-lumen porta cath with tip terminating in the mid superior vena cava Gas, oral contrast material and stool are seen scattered throughout the colon extending to the level of the distal rectum. No pathologic distension of small bowel is noted. Several nondilated gas-filled loops of small bowel are noted in the central abdomen. A few small air-fluid levels are also noted on the upright projection. No gross evidence of pneumoperitoneum. IMPRESSION: 1. Nonspecific, but nonobstructive bowel gas pattern, as above. 2. No pneumoperitoneum. 3. No radiographic evidence of acute cardiopulmonary disease. 4. Atherosclerosis. Electronically Signed   By: Vinnie Langton M.D.   On: 12/26/2015 18:26    Scheduled Meds: . ALPRAZolam  0.5 mg Oral Once  . ALPRAZolam  0.5 mg Oral BID  . amitriptyline  25 mg Oral QHS  . diphenhydrAMINE  25 mg Oral QHS  . enoxaparin (LOVENOX) injection  30 mg Subcutaneous Q24H  . fentaNYL  12.5 mcg Transdermal Q72H  . fentaNYL  25 mcg Transdermal Q72H  . potassium chloride  10 mEq Oral Daily  . sertraline  50 mg Oral Daily   Continuous Infusions: . 0.9 % NaCl with KCl 20 mEq / L 100 mL/hr at 12/27/15 1610    Assessment/Plan:  1. Dehydration. Advised the patient has eats. Decrease rate of IV fluid hydration. Recent hospitalization for ileus which had resolved. When necessary nausea medications. 2. Constipation- resolved now with diarrhea. Hold anti-constipation medications. 3. Left eye double vision- still awaiting MRI of the brain. I will check an ESR and CRP. Case discussed with Dr. Wallace Going ophthalmologist to see the patient in consultation. 4. Chronic kidney disease stage III 5. Malignant neoplasm of the uterus- last CT scan showing  lymphadenopathy.. Follow-up with oncology as outpatient 6. Anxiety- increase Xanax to 0.5 mg twice day and given a stat dose now. Patient refused to speak with psychiatrist at this point 7. Chronic pain on fentanyl patch  Code Status:     Code Status Orders        Start     Ordered   12/26/15 2109  Full code   Continuous     12/26/15 2110    Code Status History    Date Active Date Inactive Code Status Order ID Comments User Context   12/22/2015  1:21 AM 12/24/2015  2:33 PM Full Code [id number]  Harrie Foreman, MD Inpatient     Family Communication: Family at the bedside Disposition Plan: Home soon  Consultants:  Ophthalmology  Time spent: 30 minutes  [place], [facility]

## 2015-12-28 DIAGNOSIS — E860 Dehydration: Secondary | ICD-10-CM | POA: Diagnosis not present

## 2015-12-28 MED ORDER — ALPRAZOLAM 0.5 MG PO TABS: 0.5000 mg | ORAL_TABLET | Freq: Two times a day (BID) | ORAL | Status: DC

## 2015-12-28 MED ORDER — POLYETHYLENE GLYCOL 3350 17 G PO PACK: 17.0000 g | PACK | Freq: Every day | ORAL | Status: DC

## 2015-12-28 MED ORDER — TRAZODONE HCL 50 MG PO TABS
50.0000 mg | ORAL_TABLET | Freq: Once | ORAL | Status: AC
  Administered 2015-12-28: 50 mg via ORAL
  Filled 2015-12-28: qty 1

## 2015-12-28 MED ORDER — LACTULOSE 10 GM/15ML PO SOLN: 30.0000 g | Freq: Every day | ORAL | Status: DC | PRN

## 2015-12-28 MED ORDER — HYPROMELLOSE (GONIOSCOPIC) 2.5 % OP SOLN: 1.0000 [drp] | Freq: Three times a day (TID) | OPHTHALMIC | Status: DC

## 2015-12-28 NOTE — Progress Notes (Signed)
MD order received in [facility] to discharge pt home today; verbally reviewed AVS with pt including medications/gave 4 Rxs to pt; follow up appointments/pt to call on 12/29/15 to schedule appointments; no questions voiced at this time; pt's discharge pending her getting ready to go home

## 2015-12-28 NOTE — Progress Notes (Signed)
Pt ready to go; pt discharged via wheelchair by nursing to the visitor's entrance

## 2015-12-28 NOTE — Discharge Summary (Signed)
[facility] [place] NAME: [name] Graham    MR#:  [id number]  DATE OF BIRTH:  07-25-1951  DATE OF ADMISSION:  12/26/2015 ADMITTING PHYSICIAN: Hillary Bow, MD  DATE OF DISCHARGE: 12/28/2015  1:54 PM  PRIMARY CARE PHYSICIAN: [name], MD    ADMISSION DIAGNOSIS:  Ileus ([place]) [K56.7] Vision loss, left eye [H54.62]  DISCHARGE DIAGNOSIS:  Active Problems:   Dehydration   SECONDARY DIAGNOSIS:   Past Medical History  Diagnosis Date  . Cancer ([place]) 2012  . GI problem 2013  . Cancer of uterus ([place]) 04/18/2015  . Uterine cancer [facility])     HOSPITAL COURSE:   1. Dehydration. Patient was given IV fluid hydration during the hospital course. The patient had a recent hospitalization for ileus which had resolved. I advised the patient that she must eat and drink even though she is not hungry. Patient has a lot of nausea medications at home. 2. Constipation- this had resolved and now she had diarrhea. I advised MiraLAX on a daily basis and lactulose when necessary when she has severe constipation. Since she is on chronic pain medication she must avoid constipation. 3. Left eye double vision. MRI of the brain was negative. ESR and CRP slightly elevated. No further workup for temporal arteritis needed. Appreciate consultation by Dr. Wallace Going ophthalmologist. He started eyedrops for dry eyes. Her vision improved the next day. Follow-up with ophthalmology as outpatient in 2 weeks. 4. Chronic kidney disease stage III. 5. Malignant neoplasm of the uterus last CT scan showing increasing lymphadenopathy follow-up with oncology as outpatient. 6. Anxiety- I increase Xanax of 0.5 mg twice a day. Patient refused psychiatry consultation will here. 7. Chronic pain on fentanyl patch. 8. Borderline elevated troponin no further workup, since the patient had no chest pain or shortness of breath.  DISCHARGE CONDITIONS:   Satisfactory  CONSULTS  OBTAINED:  Treatment Team:  Leandrew Koyanagi, MD  DRUG ALLERGIES:   Allergies  Allergen Reactions  . Penicillins Other (See Comments)    Reaction:  Unknown     DISCHARGE MEDICATIONS:   Discharge Medication List as of 12/28/2015  1:18 PM    START taking these medications   Details  lactulose (CHRONULAC) 10 GM/15ML solution Take 45 mLs (30 g total) by mouth daily as needed for severe constipation., Starting 12/28/2015, Until Discontinued, Print    polyethylene glycol (MIRALAX / GLYCOLAX) packet Take 17 g by mouth daily., Starting 12/29/2015, Until Discontinued, Print      CONTINUE these medications which have CHANGED   Details  ALPRAZolam (XANAX) 0.5 MG tablet Take 1 tablet (0.5 mg total) by mouth 2 (two) times daily., Starting 12/28/2015, Until Discontinued, Print    hydroxypropyl methylcellulose / hypromellose (ISOPTO TEARS / GONIOVISC) 2.5 % ophthalmic solution Place 1 drop into the left eye 3 (three) times daily., Starting 12/28/2015, Until Discontinued, Print      CONTINUE these medications which have NOT CHANGED   Details  amitriptyline (ELAVIL) 25 MG tablet Take 1 tablet (25 mg total) by mouth at bedtime., Starting 12/09/2015, Until Discontinued, Normal    Ca Carbonate-Mag Hydroxide (ROLAIDS) 550-110 MG CHEW Chew 2 tablets by mouth as needed (for indigestion)., Until Discontinued, Historical Med    diphenhydrAMINE (BENADRYL) 25 MG tablet Take 25 mg by mouth at bedtime., Until Discontinued, Historical Med    fentaNYL (DURAGESIC - DOSED MCG/HR) 12 MCG/HR Place 1 patch (12.5 mcg total) onto the skin every 3 (three) days. Use with 55mg patch for  total dose of 37.67mg., Starting 12/16/2015, Until Discontinued, Print    fentaNYL (DURAGESIC - DOSED MCG/HR) 25 MCG/HR patch Place 1 patch (25 mcg total) onto the skin every 3 (three) days. Use with 12.529m patch for total dose 37.91m3m, Starting 12/16/2015, Until Discontinued, Print    fexofenadine (ALLEGRA) 180 MG tablet Take 180 mg  by mouth daily. , Until Discontinued, Historical Med    fluticasone (FLONASE) 50 MCG/ACT nasal spray Place 1-2 sprays into the nose daily as needed for allergies or rhinitis. , Starting 03/12/2013, Until Discontinued, Historical Med    HYDROcodone-acetaminophen (NORCO/VICODIN) 5-325 MG tablet Take 1 tablet by mouth every 6 (six) hours as needed for moderate pain., Starting 12/16/2015, Until Discontinued, Print    meclizine (ANTIVERT) 25 MG tablet Take 25 mg by mouth 4 (four) times daily as needed for dizziness., Until Discontinued, Historical Med    metoCLOPramide (REGLAN) 10 MG tablet Take 10 mg by mouth 4 (four) times daily as needed for nausea., Until Discontinued, Historical Med    ondansetron (ZOFRAN) 4 MG tablet Take 1 tablet (4 mg total) by mouth every 6 (six) hours as needed., Starting 08/05/2015, Until Discontinued, Normal    potassium chloride (K-DUR) 10 MEQ tablet Take 1 tablet (10 mEq total) by mouth daily., Starting 10/23/2015, Until Discontinued, Normal    sertraline (ZOLOFT) 50 MG tablet Take 50 mg by mouth daily., Until Discontinued, Historical Med    triamcinolone cream (KENALOG) 0.1 % Apply 1 application topically 2 (two) times daily as needed (for itching)., Until Discontinued, Historical Med         DISCHARGE INSTRUCTIONS:   Follow-up with medical doctor one week Follow up with ophthalmologist in 2 weeks  If you experience worsening of your admission symptoms, develop shortness of breath, life threatening emergency, suicidal or homicidal thoughts you must seek medical attention immediately by calling 911 or calling your MD immediately  if symptoms less severe.  You Must read complete instructions/literature along with all the possible adverse reactions/side effects for all the Medicines you take and that have been prescribed to you. Take any new Medicines after you have completely understood and accept all the possible adverse reactions/side effects.   Please  note  You were cared for by a hospitalist during your hospital stay. If you have any questions about your discharge medications or the care you received while you were in the hospital after you are discharged, you can call the unit and asked to speak with the hospitalist on call if the hospitalist that took care of you is not available. Once you are discharged, your primary care physician will handle any further medical issues. Please note that NO REFILLS for any discharge medications will be authorized once you are discharged, as it is imperative that you return to your primary care physician (or establish a relationship with a primary care physician if you do not have one) for your aftercare needs so that they can reassess your need for medications and monitor your lab values.    Today   CHIEF COMPLAINT:   Chief Complaint  Patient presents with  . Dizziness    HISTORY OF PRESENT ILLNESS:  KarKimyetta Flotts a 65 1o. female presented with dizziness and dehydration and constipation.   VITAL SIGNS:  Blood pressure 116/63, pulse 95, temperature 98 F (36.7 C), temperature source Oral, resp. rate 20, height 5' 3"  (1.6 m), weight 55.248 kg (121 lb 12.8 oz), SpO2 96 %.  I/O:   Intake/Output Summary (Last 24 hours)  at 12/28/15 1531 Last data filed at 12/28/15 0800  Gross per 24 hour  Intake 1050.33 ml  Output      0 ml  Net 1050.33 ml    PHYSICAL EXAMINATION:  GENERAL:  65 y.o.-year-old patient lying in the bed with no acute distress.  EYES: Pupils equal, round, reactive to light and accommodation. No scleral icterus. Extraocular muscles intact.  HEENT: Head atraumatic, normocephalic. Oropharynx and nasopharynx clear.  NECK:  Supple, no jugular venous distention. No thyroid enlargement, no tenderness.  LUNGS: Normal breath sounds bilaterally, no wheezing, rales,rhonchi or crepitation. No use of accessory muscles of respiration.  CARDIOVASCULAR: S1, S2 normal. No murmurs, rubs, or  gallops.  ABDOMEN: Soft, non-tender, non-distended. Bowel sounds present. No organomegaly or mass.  EXTREMITIES: No pedal edema, cyanosis, or clubbing.  NEUROLOGIC: Cranial nerves II through XII are intact. Muscle strength 5/5 in all extremities. Sensation intact. Gait not checked.  PSYCHIATRIC: The patient is alert and oriented x 3.  SKIN: No obvious rash, lesion, or ulcer.   DATA REVIEW:   CBC  Recent Labs Lab 12/27/15 0533  WBC 5.5  HGB 10.5*  HCT 31.4*  PLT 239    Chemistries   Recent Labs Lab 12/26/15 1713 12/27/15 0533  NA 134* 139  K 3.3* 3.3*  CL 100* 110  CO2 25 23  GLUCOSE 96 78  BUN 18 13  CREATININE 1.63* 1.45*  CALCIUM 8.8* 8.3*  AST 17  --   ALT 17  --   ALKPHOS 152*  --   BILITOT 0.6  --     Cardiac Enzymes  Recent Labs Lab 12/27/15 0533  TROPONINI 0.04*      RADIOLOGY:  Mr Kizzie Fantasia Contrast  12/27/2015  CLINICAL DATA:  Initial evaluation for left blurry vision, diplopia. History of malignancy. EXAM: MRI HEAD WITHOUT AND WITH CONTRAST TECHNIQUE: Multiplanar, multiecho pulse sequences of the brain and surrounding structures were obtained without and with intravenous contrast. CONTRAST:  55m MULTIHANCE GADOBENATE DIMEGLUMINE 529 MG/ML IV SOLN COMPARISON:  Prior CT from 12/21/2015 as well as prior MRI from 09/09/2014. FINDINGS: Cerebral volume within normal limits for patient age. Patchy T2/FLAIR hyperintense foci within the periventricular and deep white matter both cerebral hemispheres are similar to previous, most consistent with mild chronic small vessel ischemic disease. No abnormal foci of restricted diffusion to suggest acute intracranial infarct. Major intracranial vascular flow voids are maintained. Right vertebral artery is diminutive. Gray-white matter differentiation preserved. No areas of chronic infarction. No acute or chronic intracranial hemorrhage. No mass lesion, midline shift, or mass effect. No hydrocephalus. No abnormal  enhancement following contrast administration. Craniocervical junction within normal limits. Mild degenerative spondylolysis present at C4-5 without significant stenosis. Trace anterolisthesis of C2 on C3 and C3 and C4. Pituitary gland within normal limits. Globes and orbits within normal limits. Mild mucosal thickening within the ethmoidal air cells. Paranasal sinuses are otherwise largely clear. Trace opacity within the right mastoid air cells. No left mastoid effusion. Inner ear structures grossly normal. Bone marrow signal intensity within normal limits. Scalp soft tissues within normal limits. IMPRESSION: 1. No acute intracranial process. No evidence for intracranial metastasis. 2. Mild chronic microvascular ischemic disease, stable relative to 2015. Electronically Signed   By: BJeannine BogaM.D.   On: 12/27/2015 21:45   Dg Abd Acute W/chest  12/26/2015  CLINICAL DATA:  65year old female with history of left lower abdominal pain yesterday evening. EXAM: DG ABDOMEN ACUTE W/ 1V CHEST COMPARISON:  Chest x-ray 12/21/2015. Abdominal  radiograph 11/25/2014. FINDINGS: Lung volumes are normal. No consolidative airspace disease. No pleural effusions. No pneumothorax. No pulmonary nodule or mass noted. Pulmonary vasculature and the cardiomediastinal silhouette are within normal limits. Atherosclerosis in the thoracic aorta. Right internal jugular single-lumen porta cath with tip terminating in the mid superior vena cava Gas, oral contrast material and stool are seen scattered throughout the colon extending to the level of the distal rectum. No pathologic distension of small bowel is noted. Several nondilated gas-filled loops of small bowel are noted in the central abdomen. A few small air-fluid levels are also noted on the upright projection. No gross evidence of pneumoperitoneum. IMPRESSION: 1. Nonspecific, but nonobstructive bowel gas pattern, as above. 2. No pneumoperitoneum. 3. No radiographic evidence of  acute cardiopulmonary disease. 4. Atherosclerosis. Electronically Signed   By: Vinnie Langton M.D.   On: 12/26/2015 18:26    Management plans discussed with the patient, and she is in agreement.  CODE STATUS:     Code Status Orders        Start     Ordered   12/26/15 2109  Full code   Continuous     12/26/15 2110    Code Status History    Date Active Date Inactive Code Status Order ID Comments User Context   12/22/2015  1:21 AM 12/24/2015  2:33 PM Full Code [id number]  Harrie Foreman, MD Inpatient      TOTAL TIME TAKING CARE OF THIS PATIENT: 35 minutes.    Loletha Grayer M.D on 12/28/2015 at 3:31 PM  Between 7am to 6pm - Pager - [phone]  After 6pm go to [email or web address] - password [email or web address] [facility]  Office  [phone]  CC: Primary care physician; [name], MD

## 2015-12-31 ENCOUNTER — Ambulatory Visit: Payer: Medicare Other | Admitting: Family Medicine

## 2016-01-01 ENCOUNTER — Encounter: Payer: Self-pay | Admitting: Oncology

## 2016-01-01 ENCOUNTER — Inpatient Hospital Stay (HOSPITAL_BASED_OUTPATIENT_CLINIC_OR_DEPARTMENT_OTHER): Payer: Medicare Other | Admitting: Oncology

## 2016-01-01 ENCOUNTER — Inpatient Hospital Stay: Payer: Medicare Other

## 2016-01-01 VITALS — BP 137/80 | HR 105 | Temp 97.1°F | Resp 18 | Wt 120.2 lb

## 2016-01-01 DIAGNOSIS — C772 Secondary and unspecified malignant neoplasm of intra-abdominal lymph nodes: Secondary | ICD-10-CM

## 2016-01-01 DIAGNOSIS — C541 Malignant neoplasm of endometrium: Secondary | ICD-10-CM | POA: Diagnosis not present

## 2016-01-01 DIAGNOSIS — Z23 Encounter for immunization: Secondary | ICD-10-CM | POA: Diagnosis not present

## 2016-01-01 DIAGNOSIS — Z5111 Encounter for antineoplastic chemotherapy: Secondary | ICD-10-CM | POA: Diagnosis not present

## 2016-01-01 DIAGNOSIS — R21 Rash and other nonspecific skin eruption: Secondary | ICD-10-CM | POA: Diagnosis not present

## 2016-01-01 DIAGNOSIS — D649 Anemia, unspecified: Secondary | ICD-10-CM | POA: Diagnosis not present

## 2016-01-01 DIAGNOSIS — C55 Malignant neoplasm of uterus, part unspecified: Secondary | ICD-10-CM

## 2016-01-01 DIAGNOSIS — Z79899 Other long term (current) drug therapy: Secondary | ICD-10-CM | POA: Diagnosis not present

## 2016-01-01 LAB — COMPREHENSIVE METABOLIC PANEL
ALT: 13 U/L — ABNORMAL LOW (ref 14–54)
ANION GAP: 9 (ref 5–15)
AST: 20 U/L (ref 15–41)
Albumin: 3.1 g/dL — ABNORMAL LOW (ref 3.5–5.0)
Alkaline Phosphatase: 150 U/L — ABNORMAL HIGH (ref 38–126)
BILIRUBIN TOTAL: 0.6 mg/dL (ref 0.3–1.2)
BUN: 20 mg/dL (ref 6–20)
CO2: 23 mmol/L (ref 22–32)
Calcium: 8.5 mg/dL — ABNORMAL LOW (ref 8.9–10.3)
Chloride: 99 mmol/L — ABNORMAL LOW (ref 101–111)
Creatinine, Ser: 1.61 mg/dL — ABNORMAL HIGH (ref 0.44–1.00)
GFR calc Af Amer: 37 mL/min — ABNORMAL LOW (ref >60)
GFR, EST NON AFRICAN AMERICAN: 32 mL/min — AB (ref >60)
Glucose, Bld: 97 mg/dL (ref 65–99)
POTASSIUM: 3 mmol/L — AB (ref 3.5–5.1)
Sodium: 131 mmol/L — ABNORMAL LOW (ref 135–145)
TOTAL PROTEIN: 6.7 g/dL (ref 6.5–8.1)

## 2016-01-01 LAB — IRON AND TIBC
Iron: 41 ug/dL (ref 28–170)
SATURATION RATIOS: 26 % (ref 10.4–31.8)
TIBC: 156 ug/dL — AB (ref 250–450)
UIBC: 115 ug/dL

## 2016-01-01 LAB — CBC WITH DIFFERENTIAL/PLATELET
Basophils Absolute: 0 10*3/uL (ref 0–0.1)
Basophils Relative: 0 %
EOS PCT: 7 %
Eosinophils Absolute: 0.4 10*3/uL (ref 0–0.7)
HCT: 33.2 % — ABNORMAL LOW (ref 35.0–47.0)
Hemoglobin: 11.1 g/dL — ABNORMAL LOW (ref 12.0–16.0)
LYMPHS ABS: 0.5 10*3/uL — AB (ref 1.0–3.6)
LYMPHS PCT: 8 %
MCH: 28.4 pg (ref 26.0–34.0)
MCHC: 33.4 g/dL (ref 32.0–36.0)
MCV: 85 fL (ref 80.0–100.0)
MONO ABS: 0.4 10*3/uL (ref 0.2–0.9)
Monocytes Relative: 6 %
Neutro Abs: 5.2 10*3/uL (ref 1.4–6.5)
Neutrophils Relative %: 79 %
PLATELETS: 243 10*3/uL (ref 150–440)
RBC: 3.91 MIL/uL (ref 3.80–5.20)
RDW: 14.1 % (ref 11.5–14.5)
WBC: 6.5 10*3/uL (ref 3.6–11.0)

## 2016-01-01 LAB — MAGNESIUM: Magnesium: 2 mg/dL (ref 1.7–2.4)

## 2016-01-01 LAB — FERRITIN: FERRITIN: 852 ng/mL — AB (ref 11–307)

## 2016-01-01 MED ORDER — SODIUM CHLORIDE 0.9 % IV SOLN
Freq: Once | INTRAVENOUS | Status: AC
  Administered 2016-01-01: 10:00:00 via INTRAVENOUS
  Filled 2016-01-01: qty 1000

## 2016-01-01 MED ORDER — PREDNISONE 20 MG PO TABS: 20.0000 mg | ORAL_TABLET | Freq: Every day | ORAL | Status: AC

## 2016-01-01 MED ORDER — HEPARIN SOD (PORK) LOCK FLUSH 100 UNIT/ML IV SOLN
500.0000 [IU] | Freq: Once | INTRAVENOUS | Status: AC
  Administered 2016-01-01: 500 [IU] via INTRAVENOUS

## 2016-01-01 MED ORDER — SODIUM CHLORIDE 0.9 % IV SOLN: Freq: Once | INTRAVENOUS | Status: DC

## 2016-01-01 NOTE — Progress Notes (Signed)
Patient states her stomach hurts this morning.  Feels like she needs to go to [facility] but cannot.

## 2016-01-01 NOTE — Progress Notes (Signed)
[place] @ [facility] Telephone:([phone]) [phone]  Fax:([phone]) [phone]     Irys Nigh OB: [birth year]  MR#: [id number]  ASN#:[id number]  [name], MD as PCP - General (Family Medicine) [name], MD (General Surgery)  CHIEF COMPLAINT:  Chief Complaint  Patient presents with  . Endometrial Cancer    Chief Complaint/Diagnosis:   The patient is a 65 year old with metastatic endometrial cancer who is seen for assessment during ongoing palliative radiation therapy.  04/2011   papillary serous adenocarcinoma of the endometrium, stage IIIb (nodal metastasis)              TAHBSO and full staging,               GOG protocl using XRT and chemotherapy, significant XRT side-effects,               continued on chemotherapy off protocol, 04/2013   recurrence in the nodal areas,              DDP and docetaxel x 6 completed in December,   started on chemotherapy with Adriamycin(October, 2015) poor tolerance to letrozole  and affinator Estrogen and progesterone receptor negative tumor         hER-2/neu recetor negative. November 07, 2014 progressive disease by  tumor marker criteria and clinical examination November 12, 2014 After 6 cycles of chemotherapy because of increasing side effect chemotherapy was discontinued.  (March, 2016) Patient was started on cis-platinum and topotecan. May, 2016 Cis-platinum and topotecan  and was discontinued because of significant side effect and possibility of progressive disease.  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  Patient is here for ongoing evaluation and treatment consideration Had  multiple admission in the hospital.. Increasing abdominal discomfort.  Poor appetite. Here for further follow-up and  treatment consideration.  Pain is somewhat better control but using only 1 tablet for breakthrough pain and is on fentanyl past 37 g.. Poor oral intake  REVIEW OF SYSTEMS:    general status: Patient is feeling weak and tired.  Feeling lightheaded No change in a performance status.  No chills.  No fever.  Feeling extremely weak and tired HEENT: Alopecia.  No evidence of stomatitis increasing discomfort on the left side of the neck Lungs: No cough or shortness of breath Cardiac: No chest pain or paroxysmal nocturnal dyspnea GI: diarrhea and constipation is improved.  Abdominal pain is improved. On stay patient followed by black stool. Skin: Macular rash grade 2, as described above.  Patient is taking Zyrtec in the morning and Benadryl in the night   Lower extremity no swelling Neurological system: No tingling.  No numbness.  No other focal signs Musculoskeletal system no bony pains poor appetite has lost approximately 5 pounds in last month clock  PAST MEDICAL HISTORY: Past Medical History  Diagnosis Date  . Cancer ([place]) 2012  . GI problem 2013  . Cancer of uterus ([place]) 04/18/2015  . Uterine cancer ([place])     PAST SURGICAL HISTORY: Past Surgical History  Procedure Laterality Date  . Abdominal hysterectomy    . Tonsillectomy      FAMILY HISTORY No significant family history.      ADVANCED DIRECTIVES: Patient does have advance directive   HEALTH MAINTENANCE: Social History  Substance  Use Topics  . Smoking status: Never Smoker   . Smokeless tobacco: Never Used  . Alcohol Use: 0.0 oz/week    0 Standard drinks or equivalent per week      Allergies  Allergen Reactions  . Penicillins Other (See Comments)    Reaction:  Unknown     Current Outpatient Prescriptions  Medication Sig Dispense Refill  . ALPRAZolam (XANAX) 0.5 MG tablet Take 1 tablet (0.5 mg total) by mouth 2 (two) times daily. 60 tablet 0  . amitriptyline (ELAVIL) 25 MG tablet Take 1 tablet (25 mg total) by  mouth at bedtime. 30 tablet 3  . Ca Carbonate-Mag Hydroxide (ROLAIDS) 550-110 MG CHEW Chew 2 tablets by mouth as needed (for indigestion).    . diphenhydrAMINE (BENADRYL) 25 MG tablet Take 25 mg by mouth at bedtime.    . fentaNYL (DURAGESIC - DOSED MCG/HR) 12 MCG/HR Place 1 patch (12.5 mcg total) onto the skin every 3 (three) days. Use with 54mg patch for total dose of 37.533m. 10 patch 0  . fentaNYL (DURAGESIC - DOSED MCG/HR) 25 MCG/HR patch Place 1 patch (25 mcg total) onto the skin every 3 (three) days. Use with 12.65m28mpatch for total dose 37.65mc32m10 patch 0  . fexofenadine (ALLEGRA) 180 MG tablet Take 180 mg by mouth daily.     . fluticasone (FLONASE) 50 MCG/ACT nasal spray Place 1-2 sprays into the nose daily as needed for allergies or rhinitis.     . HYMarland KitchenROcodone-acetaminophen (NORCO/VICODIN) 5-325 MG tablet Take 1 tablet by mouth every 6 (six) hours as needed for moderate pain. 60 tablet 0  . hydroxypropyl methylcellulose / hypromellose (ISOPTO TEARS / GONIOVISC) 2.5 % ophthalmic solution Place 1 drop into the left eye 3 (three) times daily. 15 mL 12  . lactulose (CHRONULAC) 10 GM/15ML solution Take 45 mLs (30 g total) by mouth daily as needed for severe constipation. 240 mL 0  . meclizine (ANTIVERT) 25 MG tablet Take 25 mg by mouth 4 (four) times daily as needed for dizziness.    . metoCLOPramide (REGLAN) 10 MG tablet Take 10 mg by mouth 4 (four) times daily as needed for nausea.    . ondansetron (ZOFRAN) 4 MG tablet Take 1 tablet (4 mg total) by mouth every 6 (six) hours as needed. (Patient taking differently: Take 4 mg by mouth every 6 (six) hours as needed for nausea or vomiting. ) 60 tablet 3  . polyethylene glycol (MIRALAX / GLYCOLAX) packet Take 17 g by mouth daily. 30 each 0  . potassium chloride (K-DUR) 10 MEQ tablet Take 1 tablet (10 mEq total) by mouth daily. 30 tablet 3  . sertraline (ZOLOFT) 50 MG tablet Take 50 mg by mouth daily.    . trMarland Kitchenamcinolone cream (KENALOG) 0.1 % Apply 1  application topically 2 (two) times daily as needed (for itching).     No current facility-administered medications for this visit.   Facility-Administered Medications Ordered in Other Visits  Medication Dose Route Frequency Provider Last Rate Last Dose  . 0.9 %  sodium chloride infusion   Intravenous Continuous JanaForest Gleason 10 mL/hr at 11/04/15 1415    . sodium chloride 0.9 % injection 10 mL  10 mL Intracatheter PRN JanaForest Gleason   10 mL at 05/06/15 1028  . sodium chloride 0.9 % injection 10 mL  10 mL Intravenous PRN JanaForest Gleason      . sodium chloride 0.9 % injection 10 mL  10 mL Intravenous PRN JanaForest Gleason  OBJECTIVE:  Filed Vitals:   01/01/16 0843  BP: 137/80  Pulse: 105  Temp: 97.1 F (36.2 C)  Resp: 18     Body mass index is 21.29 kg/(m^2).    ECOG FS:1 - Symptomatic but completely ambulatory  PHYSICAL EXAM: General status: Patient is pale looking and not any acute distress Lymphatic system: Palpable left supraclavicular lymph node.  Has increase in size. Slightly increased size . Abdomen: Mild distention generalized tenderness no palpable masses no ascites Lungs: Air entry equal on both sides.  No crepitation or rhonchi or rales Cardiac: Tachycardia Lower extremity trace swelling Skin: No rash Neurological system no focal sign  LAB RESULTS:  Appointment on 01/01/2016  Component Date Value Ref Range Status  . WBC 01/01/2016 6.5  3.6 - 11.0 K/uL Final  . RBC 01/01/2016 3.91  3.80 - 5.20 MIL/uL Final  . Hemoglobin 01/01/2016 11.1* 12.0 - 16.0 g/dL Final  . HCT 01/01/2016 33.2* 35.0 - 47.0 % Final  . MCV 01/01/2016 85.0  80.0 - 100.0 fL Final  . MCH 01/01/2016 28.4  26.0 - 34.0 pg Final  . MCHC 01/01/2016 33.4  32.0 - 36.0 g/dL Final  . RDW 01/01/2016 14.1  11.5 - 14.5 % Final  . Platelets 01/01/2016 243  150 - 440 K/uL Final  . Neutrophils Relative % 01/01/2016 79   Final  . Neutro Abs 01/01/2016 5.2  1.4 - 6.5 K/uL Final  . Lymphocytes  Relative 01/01/2016 8   Final  . Lymphs Abs 01/01/2016 0.5* 1.0 - 3.6 K/uL Final  . Monocytes Relative 01/01/2016 6   Final  . Monocytes Absolute 01/01/2016 0.4  0.2 - 0.9 K/uL Final  . Eosinophils Relative 01/01/2016 7   Final  . Eosinophils Absolute 01/01/2016 0.4  0 - 0.7 K/uL Final  . Basophils Relative 01/01/2016 0   Final  . Basophils Absolute 01/01/2016 0.0  0 - 0.1 K/uL Final  . Magnesium 01/01/2016 2.0  1.7 - 2.4 mg/dL Final  . Sodium 01/01/2016 131* 135 - 145 mmol/L Final  . Potassium 01/01/2016 3.0* 3.5 - 5.1 mmol/L Final  . Chloride 01/01/2016 99* 101 - 111 mmol/L Final  . CO2 01/01/2016 23  22 - 32 mmol/L Final  . Glucose, Bld 01/01/2016 97  65 - 99 mg/dL Final  . BUN 01/01/2016 20  6 - 20 mg/dL Final  . Creatinine, Ser 01/01/2016 1.61* 0.44 - 1.00 mg/dL Final  . Calcium 01/01/2016 8.5* 8.9 - 10.3 mg/dL Final  . Total Protein 01/01/2016 6.7  6.5 - 8.1 g/dL Final  . Albumin 01/01/2016 3.1* 3.5 - 5.0 g/dL Final  . AST 01/01/2016 20  15 - 41 U/L Final  . ALT 01/01/2016 13* 14 - 54 U/L Final  . Alkaline Phosphatase 01/01/2016 150* 38 - 126 U/L Final  . Total Bilirubin 01/01/2016 0.6  0.3 - 1.2 mg/dL Final  . GFR calc non Af Amer 01/01/2016 32* >60 mL/min Final  . GFR calc Af Amer 01/01/2016 37* >60 mL/min Final   Comment: (NOTE) The eGFR has been calculated using the CKD EPI equation. This calculation has not been validated in all clinical situations. eGFR's persistently <60 mL/min signify possible Chronic Kidney Disease.   . Anion gap 01/01/2016 9  5 - 15 Final  . Ferritin 01/01/2016 852* 11 - 307 ng/mL Final  . Iron 01/01/2016 41  28 - 170 ug/dL Final  . TIBC 01/01/2016 156* 250 - 450 ug/dL Final  . Saturation Ratios 01/01/2016 26  10.4 - 31.8 %  Final  . UIBC 01/01/2016 115   Final    Lab Results  Component Value Date   CA125 18.8 07/23/2014    ASSESSMENT: Carcinoma of endometrium  Stage IV disease. Proceed with PDL 1 testing IV fluid as patient's cream  creatinine is high and BUN is high in oral intake is decreased Continue supportive therapy Prednisone 20 mg daily to increase appetite Had amitriptyline 25 mg by mouth bedtime for generalized itching and rash if there is no improvement he will would be discontinued and patient would be started on prednisone   disease appears to be stable. Regarding pain control.  Patient is using fentanyl patch patient was advised to use breakthrough pain medication more frequently if needed a. Renal functions are slightly abnormal but stable Overall poor prognosis Limited options regarding treatment Reevaluate patient next week Anemia multifactorial Will continue to observe that.. Cancer of uterus   Staging form: Corpus Uteri - Carcinoma, AJCC 7th Edition     Clinical: Stage IVB (T3b, N2, M1) - Signed by Forest Gleason, MD on 04/18/2015   Forest Gleason, MD   01/01/2016 8:57 AM

## 2016-01-06 ENCOUNTER — Other Ambulatory Visit: Payer: Self-pay | Admitting: Oncology

## 2016-01-06 DIAGNOSIS — C55 Malignant neoplasm of uterus, part unspecified: Secondary | ICD-10-CM

## 2016-01-06 NOTE — Telephone Encounter (Signed)
Patient requesting refills for Reglan and Ondansetron.   Please call to [facility] in [place].

## 2016-01-07 ENCOUNTER — Other Ambulatory Visit: Payer: Self-pay | Admitting: Family Medicine

## 2016-01-07 ENCOUNTER — Telehealth: Payer: Self-pay | Admitting: *Deleted

## 2016-01-07 ENCOUNTER — Ambulatory Visit: Payer: Medicare Other | Admitting: Oncology

## 2016-01-07 ENCOUNTER — Inpatient Hospital Stay: Payer: Medicare Other

## 2016-01-07 ENCOUNTER — Inpatient Hospital Stay: Payer: Medicare Other | Attending: Oncology | Admitting: Oncology

## 2016-01-07 ENCOUNTER — Other Ambulatory Visit: Payer: Self-pay | Admitting: *Deleted

## 2016-01-07 VITALS — BP 140/86 | HR 104 | Temp 98.0°F | Resp 18 | Wt 116.4 lb

## 2016-01-07 DIAGNOSIS — E860 Dehydration: Secondary | ICD-10-CM | POA: Diagnosis not present

## 2016-01-07 DIAGNOSIS — R197 Diarrhea, unspecified: Secondary | ICD-10-CM | POA: Insufficient documentation

## 2016-01-07 DIAGNOSIS — R112 Nausea with vomiting, unspecified: Secondary | ICD-10-CM | POA: Diagnosis not present

## 2016-01-07 DIAGNOSIS — F419 Anxiety disorder, unspecified: Secondary | ICD-10-CM | POA: Insufficient documentation

## 2016-01-07 DIAGNOSIS — C772 Secondary and unspecified malignant neoplasm of intra-abdominal lymph nodes: Secondary | ICD-10-CM | POA: Insufficient documentation

## 2016-01-07 DIAGNOSIS — C541 Malignant neoplasm of endometrium: Secondary | ICD-10-CM

## 2016-01-07 DIAGNOSIS — C55 Malignant neoplasm of uterus, part unspecified: Secondary | ICD-10-CM

## 2016-01-07 DIAGNOSIS — Z79899 Other long term (current) drug therapy: Secondary | ICD-10-CM | POA: Diagnosis not present

## 2016-01-07 DIAGNOSIS — R21 Rash and other nonspecific skin eruption: Secondary | ICD-10-CM | POA: Diagnosis not present

## 2016-01-07 DIAGNOSIS — Z95828 Presence of other vascular implants and grafts: Secondary | ICD-10-CM

## 2016-01-07 LAB — CBC WITH DIFFERENTIAL/PLATELET
Basophils Absolute: 0 10*3/uL (ref 0–0.1)
Basophils Relative: 0 %
Eosinophils Absolute: 0.1 10*3/uL (ref 0–0.7)
Eosinophils Relative: 1 %
HEMATOCRIT: 32 % — AB (ref 35.0–47.0)
HEMOGLOBIN: 10.7 g/dL — AB (ref 12.0–16.0)
LYMPHS ABS: 0.3 10*3/uL — AB (ref 1.0–3.6)
LYMPHS PCT: 4 %
MCH: 28.2 pg (ref 26.0–34.0)
MCHC: 33.6 g/dL (ref 32.0–36.0)
MCV: 84 fL (ref 80.0–100.0)
MONOS PCT: 2 %
Monocytes Absolute: 0.1 10*3/uL — ABNORMAL LOW (ref 0.2–0.9)
NEUTROS PCT: 93 %
Neutro Abs: 7.3 10*3/uL — ABNORMAL HIGH (ref 1.4–6.5)
Platelets: 326 10*3/uL (ref 150–440)
RBC: 3.81 MIL/uL (ref 3.80–5.20)
RDW: 13.9 % (ref 11.5–14.5)
WBC: 7.8 10*3/uL (ref 3.6–11.0)

## 2016-01-07 LAB — COMPREHENSIVE METABOLIC PANEL
ALK PHOS: 164 U/L — AB (ref 38–126)
ALT: 15 U/L (ref 14–54)
ANION GAP: 8 (ref 5–15)
AST: 16 U/L (ref 15–41)
Albumin: 3.4 g/dL — ABNORMAL LOW (ref 3.5–5.0)
BILIRUBIN TOTAL: 0.5 mg/dL (ref 0.3–1.2)
BUN: 24 mg/dL — ABNORMAL HIGH (ref 6–20)
CALCIUM: 8.8 mg/dL — AB (ref 8.9–10.3)
CO2: 21 mmol/L — AB (ref 22–32)
CREATININE: 1.41 mg/dL — AB (ref 0.44–1.00)
Chloride: 99 mmol/L — ABNORMAL LOW (ref 101–111)
GFR, EST AFRICAN AMERICAN: 44 mL/min — AB (ref >60)
GFR, EST NON AFRICAN AMERICAN: 38 mL/min — AB (ref >60)
Glucose, Bld: 136 mg/dL — ABNORMAL HIGH (ref 65–99)
Potassium: 4.1 mmol/L (ref 3.5–5.1)
Sodium: 128 mmol/L — ABNORMAL LOW (ref 135–145)
TOTAL PROTEIN: 7.2 g/dL (ref 6.5–8.1)

## 2016-01-07 LAB — SAMPLE TO BLOOD BANK

## 2016-01-07 LAB — MAGNESIUM: Magnesium: 2.2 mg/dL (ref 1.7–2.4)

## 2016-01-07 MED ORDER — METOCLOPRAMIDE HCL 10 MG PO TABS: 10.0000 mg | ORAL_TABLET | Freq: Four times a day (QID) | ORAL | Status: AC | PRN

## 2016-01-07 MED ORDER — ONDANSETRON HCL 4 MG PO TABS: 4.0000 mg | ORAL_TABLET | Freq: Four times a day (QID) | ORAL | Status: DC | PRN

## 2016-01-07 MED ORDER — SODIUM CHLORIDE 0.9% FLUSH
10.0000 mL | INTRAVENOUS | Status: DC | PRN
  Filled 2016-01-07: qty 10

## 2016-01-07 MED ORDER — SODIUM CHLORIDE 0.9 % IV SOLN
Freq: Once | INTRAVENOUS | Status: AC
  Administered 2016-01-07: 15:00:00 via INTRAVENOUS
  Filled 2016-01-07: qty 4

## 2016-01-07 MED ORDER — HEPARIN SOD (PORK) LOCK FLUSH 100 UNIT/ML IV SOLN
500.0000 [IU] | Freq: Once | INTRAVENOUS | Status: AC
  Administered 2016-01-07: 500 [IU] via INTRAVENOUS

## 2016-01-07 MED ORDER — SODIUM CHLORIDE 0.9 % IV SOLN
Freq: Once | INTRAVENOUS | Status: AC
  Administered 2016-01-07: 15:00:00 via INTRAVENOUS
  Filled 2016-01-07: qty 1000

## 2016-01-07 MED ORDER — SERTRALINE HCL 100 MG PO TABS: 100.0000 mg | ORAL_TABLET | Freq: Every day | ORAL | Status: DC

## 2016-01-07 NOTE — Telephone Encounter (Signed)
Has had diarrhea and vomiting since Monday, has not taken anything for it. She has an appt tomorrow, but is too weak to wait until tomorrow. Per L Herring, AGNP-C she can come in for IVF if there is a chair available and her labs can be checked. She will come in at 130 today and have IVF in lab OKd by Maxie Barb and Raul Del. She was instructed to take Imodium AD 2 tabs now and 1 after each loose bm. She stated she will have her friend go get her some.

## 2016-01-07 NOTE — Addendum Note (Signed)
Addended by: Telford Nab on: 01/07/2016 11:46 AM   Modules accepted: Orders, Medications

## 2016-01-07 NOTE — Telephone Encounter (Signed)
Refills escribed to pharmacy. 

## 2016-01-07 NOTE — Progress Notes (Signed)
[place]  Telephone:(336) [phone]  Fax:(336) [phone]     Eldana Isip DOB: 1951/09/09  MR#: [id number]  MHD#:[id number]  [name], MD as PCP - General (Family Medicine) [name], MD (General Surgery)  CHIEF COMPLAINT:  Chief Complaint  Patient presents with  . Endometrial Cancer    INTERVAL HISTORY: Patient is here as an acute add on regarding nausea, vomiting, diarrhea since Monday of this week. 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 Patient was recently discharged from the hospital with abdominal discomfort, was diagnosed with ileus. Most recently she was treated with Nivolumab but this was discontinued in December 2016 due to progressive disease. She is currently using fentanyl patch 37 g for pain as well as hydrocodone for breakthrough pain. She states that her stools have become very narrow since Tuesday and are more liquid today. She has taken 2 Imodium this morning and diarrhea has stopped. She had an episode of vomiting that was dark liquid on Monday evening. She has had very little oral intake. Reports that her weight has dropped.  REVIEW OF SYSTEMS:   Review of Systems  Constitutional: Positive for weight loss and malaise/fatigue. Negative for fever, chills and diaphoresis.  HENT: Negative.   Eyes: Negative.   Respiratory: Negative for cough, hemoptysis, sputum production, shortness of breath and wheezing.   Cardiovascular: Negative for chest pain, palpitations, orthopnea, claudication, leg swelling and PND.  Gastrointestinal: Positive for nausea, vomiting, abdominal pain and diarrhea. Negative for heartburn, constipation, blood in stool and melena.  Genitourinary: Negative.   Musculoskeletal: Negative.   Skin: Negative.   Neurological: Positive for weakness. Negative for dizziness, tingling, focal weakness and seizures.  Endo/Heme/Allergies: Does not  bruise/bleed easily.  Psychiatric/Behavioral: Negative for depression. The patient is not nervous/anxious and does not have insomnia.     As per HPI. Otherwise, a complete review of systems is negatve.  ONCOLOGY HISTORY: Oncology History   Chief Complaint/Diagnosis:   The patient is a 65 year old with metastatic endometrial cancer who is seen for assessment during ongoing palliative radiation therapy.  04/2011   papillary serous adenocarcinoma of the endometrium, stage IIIb (nodal metastasis)              TAHBSO and full staging,               GOG protocl using XRT and chemotherapy, significant XRT side-effects,               continued on chemotherapy off protocol, 04/2013   recurrence in the nodal areas,              DDP and docetaxel x 6 completed in December,   started on chemotherapy with Adriamycin(October, 2015) poor tolerance to letrozole  and affinator Estrogen and progesterone receptor negative tumor         hER-2/neu recetor negative. November 07, 2014 progressive disease by  tumor marker criteria and clinical examination November 12, 2014 After 6 cycles of chemotherapy because of increasing side effect chemotherapy was discontinued.  (March, 2016) Patient was started on cis-platinum and topotecan. May, 2016 Cis-platinum into [place] and was discontinued because of significant side effect and possibility of progressive disease.  Patient would be started on NIVOLULAMAB on compassionate use basis  Nivolumab was discontinued in December 2016 due to progressing disease.     Cancer of uterus ([place])   04/18/2015 Initial Diagnosis Cancer of uterus  PAST MEDICAL HISTORY: Past Medical History  Diagnosis Date  . Cancer ([place]) 2012  . GI problem 2013  . Cancer of uterus ([place]) 04/18/2015  . Uterine cancer ([place])     PAST SURGICAL HISTORY: Past Surgical History  Procedure Laterality Date  . Abdominal hysterectomy    . Tonsillectomy      FAMILY HISTORY Family History  Problem  Relation Age of Onset  . Hypertension Other     GYNECOLOGIC HISTORY:  No LMP recorded. Patient has had a hysterectomy.     ADVANCED DIRECTIVES:    HEALTH MAINTENANCE: Social History  Substance Use Topics  . Smoking status: Never Smoker   . Smokeless tobacco: Never Used  . Alcohol Use: 0.0 oz/week    0 Standard drinks or equivalent per week     Colonoscopy:  PAP:  Bone density:  Lipid panel:  Allergies  Allergen Reactions  . Penicillins Other (See Comments)    Reaction:  Unknown     Current Outpatient Prescriptions  Medication Sig Dispense Refill  . ALPRAZolam (XANAX) 0.5 MG tablet Take 1 tablet (0.5 mg total) by mouth 2 (two) times daily. 60 tablet 0  . amitriptyline (ELAVIL) 25 MG tablet Take 1 tablet (25 mg total) by mouth at bedtime. 30 tablet 3  . Ca Carbonate-Mag Hydroxide (ROLAIDS) 550-110 MG CHEW Chew 2 tablets by mouth as needed (for indigestion).    . diphenhydrAMINE (BENADRYL) 25 MG tablet Take 25 mg by mouth at bedtime.    . fentaNYL (DURAGESIC - DOSED MCG/HR) 12 MCG/HR Place 1 patch (12.5 mcg total) onto the skin every 3 (three) days. Use with 6mg patch for total dose of 37.540m. 10 patch 0  . fentaNYL (DURAGESIC - DOSED MCG/HR) 25 MCG/HR patch Place 1 patch (25 mcg total) onto the skin every 3 (three) days. Use with 12.76m64mpatch for total dose 37.76mc84m10 patch 0  . fexofenadine (ALLEGRA) 180 MG tablet Take 180 mg by mouth daily.     . fluticasone (FLONASE) 50 MCG/ACT nasal spray Place 1-2 sprays into the nose daily as needed for allergies or rhinitis.     . HYMarland KitchenROcodone-acetaminophen (NORCO/VICODIN) 5-325 MG tablet Take 1 tablet by mouth every 6 (six) hours as needed for moderate pain. 60 tablet 0  . hydroxypropyl methylcellulose / hypromellose (ISOPTO TEARS / GONIOVISC) 2.5 % ophthalmic solution Place 1 drop into the left eye 3 (three) times daily. 15 mL 12  . lactulose (CHRONULAC) 10 GM/15ML solution Take 45 mLs (30 g total) by mouth daily as needed for  severe constipation. 240 mL 0  . meclizine (ANTIVERT) 25 MG tablet Take 25 mg by mouth 4 (four) times daily as needed for dizziness.    . metoCLOPramide (REGLAN) 10 MG tablet Take 1 tablet (10 mg total) by mouth 4 (four) times daily as needed for nausea. 60 tablet 3  . ondansetron (ZOFRAN) 4 MG tablet Take 1 tablet (4 mg total) by mouth every 6 (six) hours as needed for nausea or vomiting. 60 tablet 3  . polyethylene glycol (MIRALAX / GLYCOLAX) packet Take 17 g by mouth daily. 30 each 0  . potassium chloride (K-DUR) 10 MEQ tablet TAKE 1 TABLET(10 MEQ) BY MOUTH DAILY 90 tablet 3  . predniSONE (DELTASONE) 20 MG tablet Take 1 tablet (20 mg total) by mouth daily with breakfast. 30 tablet 3  . sertraline (ZOLOFT) 100 MG tablet Take 1 tablet (100 mg total) by mouth daily. 30 tablet 5  . triamcinolone cream (KENALOG) 0.1 % Apply 1  application topically 2 (two) times daily as needed (for itching).     No current facility-administered medications for this visit.   Facility-Administered Medications Ordered in Other Visits  Medication Dose Route Frequency Provider Last Rate Last Dose  . 0.9 %  sodium chloride infusion   Intravenous Continuous Forest Gleason, MD 10 mL/hr at 11/04/15 1415    . sodium chloride 0.9 % injection 10 mL  10 mL Intracatheter PRN Forest Gleason, MD   10 mL at 05/06/15 1028  . sodium chloride 0.9 % injection 10 mL  10 mL Intravenous PRN Forest Gleason, MD      . sodium chloride 0.9 % injection 10 mL  10 mL Intravenous PRN Forest Gleason, MD        OBJECTIVE: BP 140/86 mmHg  Pulse 104  Temp(Src) 98 F (36.7 C) (Tympanic)  Resp 18  Wt 116 lb 6.5 oz (52.8 kg)   Body mass index is 20.63 kg/(m^2).    ECOG FS:1 - Symptomatic but completely ambulatory  General: Well-developed, thin, no acute distress. Eyes: Pink conjunctiva, anicteric sclera. HEENT: Normocephalic, moist mucous membranes, clear oropharnyx. Lungs: Clear to auscultation bilaterally. Heart: Regular rate and rhythm. No rubs,  murmurs, or gallops. Abdomen: Soft, tender. No organomegaly noted, normoactive bowel sounds. Musculoskeletal: No edema, cyanosis, or clubbing. Neuro: Alert, answering all questions appropriately. Cranial nerves grossly intact. Skin: No rashes or petechiae noted. Psych: Normal affect.   LAB RESULTS:  Appointment on 01/07/2016  Component Date Value Ref Range Status  . WBC 01/07/2016 7.8  3.6 - 11.0 K/uL Final  . RBC 01/07/2016 3.81  3.80 - 5.20 MIL/uL Final  . Hemoglobin 01/07/2016 10.7* 12.0 - 16.0 g/dL Final  . HCT 01/07/2016 32.0* 35.0 - 47.0 % Final  . MCV 01/07/2016 84.0  80.0 - 100.0 fL Final  . MCH 01/07/2016 28.2  26.0 - 34.0 pg Final  . MCHC 01/07/2016 33.6  32.0 - 36.0 g/dL Final  . RDW 01/07/2016 13.9  11.5 - 14.5 % Final  . Platelets 01/07/2016 326  150 - 440 K/uL Final  . Neutrophils Relative % 01/07/2016 93   Final  . Neutro Abs 01/07/2016 7.3* 1.4 - 6.5 K/uL Final  . Lymphocytes Relative 01/07/2016 4   Final  . Lymphs Abs 01/07/2016 0.3* 1.0 - 3.6 K/uL Final  . Monocytes Relative 01/07/2016 2   Final  . Monocytes Absolute 01/07/2016 0.1* 0.2 - 0.9 K/uL Final  . Eosinophils Relative 01/07/2016 1   Final  . Eosinophils Absolute 01/07/2016 0.1  0 - 0.7 K/uL Final  . Basophils Relative 01/07/2016 0   Final  . Basophils Absolute 01/07/2016 0.0  0 - 0.1 K/uL Final  . Sodium 01/07/2016 128* 135 - 145 mmol/L Final  . Potassium 01/07/2016 4.1  3.5 - 5.1 mmol/L Final  . Chloride 01/07/2016 99* 101 - 111 mmol/L Final  . CO2 01/07/2016 21* 22 - 32 mmol/L Final  . Glucose, Bld 01/07/2016 136* 65 - 99 mg/dL Final  . BUN 01/07/2016 24* 6 - 20 mg/dL Final  . Creatinine, Ser 01/07/2016 1.41* 0.44 - 1.00 mg/dL Final  . Calcium 01/07/2016 8.8* 8.9 - 10.3 mg/dL Final  . Total Protein 01/07/2016 7.2  6.5 - 8.1 g/dL Final  . Albumin 01/07/2016 3.4* 3.5 - 5.0 g/dL Final  . AST 01/07/2016 16  15 - 41 U/L Final  . ALT 01/07/2016 15  14 - 54 U/L Final  . Alkaline Phosphatase 01/07/2016  164* 38 - 126 U/L Final  . Total Bilirubin  01/07/2016 0.5  0.3 - 1.2 mg/dL Final  . GFR calc non Af Amer 01/07/2016 38* >60 mL/min Final  . GFR calc Af Amer 01/07/2016 44* >60 mL/min Final   Comment: (NOTE) The eGFR has been calculated using the CKD EPI equation. This calculation has not been validated in all clinical situations. eGFR's persistently <60 mL/min signify possible Chronic Kidney Disease.   . Anion gap 01/07/2016 8  5 - 15 Final  . Magnesium 01/07/2016 2.2  1.7 - 2.4 mg/dL Final  . Blood Bank Specimen 01/07/2016 SAMPLE AVAILABLE FOR TESTING   Final  . Sample Expiration 01/07/2016 01/10/2016   Final    STUDIES: No results found.  ASSESSMENT:  Carcinoma of the endometrium, stage IV B T3b N2 M1.  PLAN:   1. Endometrial CA. Patient progressed on multiple regimens of treatment. Currently has a pending PDL 1 testing. Mr. Recent treatment regimen was Nivolumab but this was discontinued in December 2016 due to progressing disease as well as increasing diarrhea. 2. Nausea, vomiting, diarrhea. Patient will receive IV fluids for supportive therapy as well as Zofran and Decadron today. Physician discussed with patient progressive disease and that most likely diarrhea was secondary to bowel obstruction from progressing disease. Refill to be sent in for ondansetron. 3. Anxiety. We'll increase patient's sertraline to 100 mg daily. At discharge from last hospital visit patient's alprazolam was increased to 0.5 mg. Advised patient to continue with this dose in the morning and in the evening, during the day she was advised that she could take 0.25 mg to 0.5 mg as needed.  Patient will return in approximately 1 week for further follow-up and discussion regarding any further treatments that may be available.  Patient expressed understanding and was in agreement with this plan. She also understands that She can call clinic at any time with any questions, concerns, or complaints.    Cancer of  uterus [facility])   Staging form: Corpus Uteri - Carcinoma, AJCC 7th Edition     Clinical: Stage IVB (T3b, N2, M1) - Signed by Forest Gleason, MD on 04/18/2015   Evlyn Kanner, NP   01/07/2016 3:32 PM Attending's note Patient was examined by me.  All the findings are confirmed.  All lab data has been reviewed. I had detailed discussion with patient regarding prognosis.  Overall limited options. We will get PDL 1. End review all the available options Also discussed possibility of palliative care

## 2016-01-07 NOTE — Progress Notes (Signed)
Patient here today as acute add on for n/v and diarrhea.  States she threw up Monday night and states it looked like her loose stools.  States she has had diarrhea since Monday.  She has no appetite and is only taking in small amounts and mostly soups.  She is trying to drink fluids.  States she has not felt good since her last hospital admission.  Patient is crying as she is telling me her symptoms.  States she gets SOB with throwing up.  Also c/o weakness.  Not sleeping well.  Anxiety level is very high.

## 2016-01-08 ENCOUNTER — Inpatient Hospital Stay: Payer: Medicare Other | Admitting: Oncology

## 2016-01-08 ENCOUNTER — Inpatient Hospital Stay: Payer: Medicare Other

## 2016-01-09 ENCOUNTER — Telehealth: Payer: Self-pay | Admitting: *Deleted

## 2016-01-09 ENCOUNTER — Inpatient Hospital Stay: Payer: Medicare Other

## 2016-01-09 VITALS — BP 124/75 | HR 75 | Temp 98.0°F | Resp 18

## 2016-01-09 DIAGNOSIS — Z8542 Personal history of malignant neoplasm of other parts of uterus: Secondary | ICD-10-CM

## 2016-01-09 DIAGNOSIS — C541 Malignant neoplasm of endometrium: Secondary | ICD-10-CM | POA: Diagnosis not present

## 2016-01-09 DIAGNOSIS — R590 Localized enlarged lymph nodes: Secondary | ICD-10-CM

## 2016-01-09 DIAGNOSIS — F32A Depression, unspecified: Secondary | ICD-10-CM

## 2016-01-09 DIAGNOSIS — K567 Ileus, unspecified: Secondary | ICD-10-CM

## 2016-01-09 DIAGNOSIS — E860 Dehydration: Secondary | ICD-10-CM

## 2016-01-09 DIAGNOSIS — K520 Gastroenteritis and colitis due to radiation: Secondary | ICD-10-CM

## 2016-01-09 DIAGNOSIS — F329 Major depressive disorder, single episode, unspecified: Secondary | ICD-10-CM

## 2016-01-09 MED ORDER — HEPARIN SOD (PORK) LOCK FLUSH 100 UNIT/ML IV SOLN
500.0000 [IU] | Freq: Once | INTRAVENOUS | Status: AC | PRN
  Administered 2016-01-09: 500 [IU]

## 2016-01-09 MED ORDER — SODIUM CHLORIDE 0.9 % IV SOLN
Freq: Once | INTRAVENOUS | Status: AC
  Administered 2016-01-09: 11:00:00 via INTRAVENOUS
  Filled 2016-01-09: qty 4

## 2016-01-09 MED ORDER — HEPARIN SOD (PORK) LOCK FLUSH 100 UNIT/ML IV SOLN
INTRAVENOUS | Status: AC
  Filled 2016-01-09: qty 5

## 2016-01-09 MED ORDER — SODIUM CHLORIDE 0.9 % IV SOLN
Freq: Once | INTRAVENOUS | Status: AC
  Administered 2016-01-09: 11:00:00 via INTRAVENOUS
  Filled 2016-01-09: qty 1000

## 2016-01-09 MED ORDER — SODIUM CHLORIDE 0.9 % IJ SOLN
10.0000 mL | INTRAMUSCULAR | Status: DC | PRN
  Administered 2016-01-09: 10 mL
  Filled 2016-01-09: qty 10

## 2016-01-09 NOTE — Telephone Encounter (Signed)
Per Magda Paganini, okay to add on for 1 hour IVF. Pt on her way now.

## 2016-01-09 NOTE — Telephone Encounter (Signed)
Pt states took miralax last night due to constipation. Started vomiting x 3 times and diarrhea x3-4 times since last night. Wants to know if needs IVF today. Pt states feels "terrible."

## 2016-01-12 MED ORDER — HEPARIN SOD (PORK) LOCK FLUSH 100 UNIT/ML IV SOLN
INTRAVENOUS | Status: AC
  Filled 2016-01-12: qty 5

## 2016-01-14 ENCOUNTER — Encounter: Payer: Self-pay | Admitting: Oncology

## 2016-01-14 ENCOUNTER — Inpatient Hospital Stay: Payer: Medicare Other

## 2016-01-14 ENCOUNTER — Inpatient Hospital Stay (HOSPITAL_BASED_OUTPATIENT_CLINIC_OR_DEPARTMENT_OTHER): Payer: Medicare Other | Admitting: Oncology

## 2016-01-14 VITALS — BP 128/71 | HR 90 | Temp 97.6°F | Resp 18 | Wt 115.1 lb

## 2016-01-14 DIAGNOSIS — F419 Anxiety disorder, unspecified: Secondary | ICD-10-CM | POA: Diagnosis not present

## 2016-01-14 DIAGNOSIS — Z79899 Other long term (current) drug therapy: Secondary | ICD-10-CM

## 2016-01-14 DIAGNOSIS — C541 Malignant neoplasm of endometrium: Secondary | ICD-10-CM | POA: Diagnosis not present

## 2016-01-14 DIAGNOSIS — M25551 Pain in right hip: Secondary | ICD-10-CM

## 2016-01-14 DIAGNOSIS — C772 Secondary and unspecified malignant neoplasm of intra-abdominal lymph nodes: Secondary | ICD-10-CM | POA: Diagnosis not present

## 2016-01-14 DIAGNOSIS — E860 Dehydration: Secondary | ICD-10-CM

## 2016-01-14 DIAGNOSIS — C55 Malignant neoplasm of uterus, part unspecified: Secondary | ICD-10-CM

## 2016-01-14 LAB — CBC WITH DIFFERENTIAL/PLATELET
Basophils Absolute: 0 10*3/uL (ref 0–0.1)
Basophils Relative: 0 %
EOS ABS: 0 10*3/uL (ref 0–0.7)
EOS PCT: 0 %
HCT: 31.6 % — ABNORMAL LOW (ref 35.0–47.0)
Hemoglobin: 10.5 g/dL — ABNORMAL LOW (ref 12.0–16.0)
LYMPHS ABS: 0.3 10*3/uL — AB (ref 1.0–3.6)
LYMPHS PCT: 3 %
MCH: 28.3 pg (ref 26.0–34.0)
MCHC: 33.3 g/dL (ref 32.0–36.0)
MCV: 85 fL (ref 80.0–100.0)
MONOS PCT: 3 %
Monocytes Absolute: 0.3 10*3/uL (ref 0.2–0.9)
Neutro Abs: 9 10*3/uL — ABNORMAL HIGH (ref 1.4–6.5)
Neutrophils Relative %: 94 %
PLATELETS: 318 10*3/uL (ref 150–440)
RBC: 3.72 MIL/uL — ABNORMAL LOW (ref 3.80–5.20)
RDW: 14.4 % (ref 11.5–14.5)
WBC: 9.6 10*3/uL (ref 3.6–11.0)

## 2016-01-14 LAB — COMPREHENSIVE METABOLIC PANEL
ALK PHOS: 115 U/L (ref 38–126)
ALT: 8 U/L — AB (ref 14–54)
ANION GAP: 7 (ref 5–15)
AST: 12 U/L — ABNORMAL LOW (ref 15–41)
Albumin: 3.4 g/dL — ABNORMAL LOW (ref 3.5–5.0)
BUN: 27 mg/dL — ABNORMAL HIGH (ref 6–20)
CALCIUM: 8.6 mg/dL — AB (ref 8.9–10.3)
CHLORIDE: 98 mmol/L — AB (ref 101–111)
CO2: 23 mmol/L (ref 22–32)
CREATININE: 1.44 mg/dL — AB (ref 0.44–1.00)
GFR, EST AFRICAN AMERICAN: 42 mL/min — AB (ref >60)
GFR, EST NON AFRICAN AMERICAN: 37 mL/min — AB (ref >60)
Glucose, Bld: 166 mg/dL — ABNORMAL HIGH (ref 65–99)
Potassium: 4.5 mmol/L (ref 3.5–5.1)
SODIUM: 128 mmol/L — AB (ref 135–145)
Total Bilirubin: 0.5 mg/dL (ref 0.3–1.2)
Total Protein: 6.8 g/dL (ref 6.5–8.1)

## 2016-01-14 MED ORDER — MEGESTROL ACETATE 400 MG/10ML PO SUSP: 400.0000 mg | Freq: Two times a day (BID) | ORAL | Status: DC

## 2016-01-14 MED ORDER — HYDROCODONE-ACETAMINOPHEN 5-325 MG PO TABS: 1.0000 | ORAL_TABLET | Freq: Four times a day (QID) | ORAL | Status: DC | PRN

## 2016-01-14 NOTE — Progress Notes (Signed)
[place] @ [facility] Telephone:(336) [phone]  Fax:(336) [phone]     [name] Graham OB: 07-01-1951  MR#: [id number]  GGE#:[id number]  [name], MD as PCP - General (Family Medicine) [name], MD (General Surgery)  CHIEF COMPLAINT:  Chief Complaint  Patient presents with  . Endometrial Cancer    Chief Complaint/Diagnosis:   The patient is a 65 year old with metastatic endometrial cancer who is seen for assessment during ongoing palliative radiation therapy.  04/2011   papillary serous adenocarcinoma of the endometrium, stage IIIb (nodal metastasis)              TAHBSO and full staging,               GOG protocl using XRT and chemotherapy, significant XRT side-effects,               continued on chemotherapy off protocol, 04/2013   recurrence in the nodal areas,              DDP and docetaxel x 6 completed in December,   started on chemotherapy with Adriamycin(October, 2015) poor tolerance to letrozole  and affinator Estrogen and progesterone receptor negative tumor         hER-2/neu recetor negative. November 07, 2014 progressive disease by  tumor marker criteria and clinical examination November 12, 2014 After 6 cycles of chemotherapy because of increasing side effect chemotherapy was discontinued.  (March, 2016) Patient was started on cis-platinum and topotecan. May, 2016 Cis-platinum and topotecan  and was discontinued because of significant side effect and possibility of progressive disease.  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  Patient is here for ongoing evaluation and treatment consideration Had  multiple admission in the hospital.. Increasing abdominal discomfort.  Poor appetite. Here for further follow-up and  treatment consideration.  Pain is somewhat better control but using only 1 tablet for breakthrough pain and is on fentanyl past 37 g.. Poor oral intake Patient is here for further evaluation and treatment consideration.  Declining condition.  Patient required IV fluid.  Abdominal pain is improved on fentanyl patch.  Appetite is poor. REVIEW OF SYSTEMS:    general status: Patient is feeling weak and tired.  Feeling lightheaded No change in a performance status.  No chills.  No fever.  Feeling extremely weak and tired HEENT: Alopecia.  No evidence of stomatitis increasing discomfort on the left side of the neck Lungs: No cough or shortness of breath Cardiac: No chest pain or paroxysmal nocturnal dyspnea GI: diarrhea and constipation is improved.  Abdominal pain is improved. On stay patient followed by black stool. Skin: Macular rash grade 2, as described above.  Patient is taking Zyrtec in the morning and Benadryl in the night   Lower extremity no swelling Neurological system: No tingling.  No numbness.  No other focal signs Musculoskeletal system no bony pains poor appetite has lost approximately 5 pounds in last month clock  PAST MEDICAL HISTORY: Past Medical History  Diagnosis Date  . Cancer ([place]) 2012  . GI problem 2013  . Cancer of uterus ([place]) 04/18/2015  . Uterine cancer ([place])     PAST SURGICAL HISTORY: Past Surgical History  Procedure Laterality Date  . Abdominal hysterectomy    . Tonsillectomy  FAMILY HISTORY No significant family history.      ADVANCED DIRECTIVES: Patient does have advance directive   HEALTH MAINTENANCE: Social History  Substance Use Topics  . Smoking status: Never Smoker   . Smokeless tobacco: Never Used  . Alcohol Use: 0.0 oz/week    0 Standard drinks or equivalent per week      Allergies  Allergen Reactions  . Penicillins Other (See Comments)    Reaction:  Unknown     Current Outpatient Prescriptions  Medication Sig Dispense  Refill  . ALPRAZolam (XANAX) 0.5 MG tablet Take 1 tablet (0.5 mg total) by mouth 2 (two) times daily. 60 tablet 0  . amitriptyline (ELAVIL) 25 MG tablet Take 1 tablet (25 mg total) by mouth at bedtime. 30 tablet 3  . Ca Carbonate-Mag Hydroxide (ROLAIDS) 550-110 MG CHEW Chew 2 tablets by mouth as needed (for indigestion).    . diphenhydrAMINE (BENADRYL) 25 MG tablet Take 25 mg by mouth at bedtime.    . fentaNYL (DURAGESIC - DOSED MCG/HR) 12 MCG/HR Place 1 patch (12.5 mcg total) onto the skin every 3 (three) days. Use with 9mg patch for total dose of 37.543m. 10 patch 0  . fentaNYL (DURAGESIC - DOSED MCG/HR) 25 MCG/HR patch Place 1 patch (25 mcg total) onto the skin every 3 (three) days. Use with 12.39m12mpatch for total dose 37.39mc44m10 patch 0  . fexofenadine (ALLEGRA) 180 MG tablet Take 180 mg by mouth daily.     . fluticasone (FLONASE) 50 MCG/ACT nasal spray Place 1-2 sprays into the nose daily as needed for allergies or rhinitis.     . HYMarland KitchenROcodone-acetaminophen (NORCO/VICODIN) 5-325 MG tablet Take 1 tablet by mouth every 6 (six) hours as needed for moderate pain. 60 tablet 0  . hydroxypropyl methylcellulose / hypromellose (ISOPTO TEARS / GONIOVISC) 2.5 % ophthalmic solution Place 1 drop into the left eye 3 (three) times daily. 15 mL 12  . lactulose (CHRONULAC) 10 GM/15ML solution Take 45 mLs (30 g total) by mouth daily as needed for severe constipation. 240 mL 0  . meclizine (ANTIVERT) 25 MG tablet Take 25 mg by mouth 4 (four) times daily as needed for dizziness.    . metoCLOPramide (REGLAN) 10 MG tablet Take 1 tablet (10 mg total) by mouth 4 (four) times daily as needed for nausea. 60 tablet 3  . ondansetron (ZOFRAN) 4 MG tablet Take 1 tablet (4 mg total) by mouth every 6 (six) hours as needed for nausea or vomiting. 60 tablet 3  . polyethylene glycol (MIRALAX / GLYCOLAX) packet Take 17 g by mouth daily. 30 each 0  . potassium chloride (K-DUR) 10 MEQ tablet TAKE 1 TABLET(10 MEQ) BY MOUTH DAILY  90 tablet 3  . predniSONE (DELTASONE) 20 MG tablet Take 1 tablet (20 mg total) by mouth daily with breakfast. 30 tablet 3  . sertraline (ZOLOFT) 100 MG tablet Take 1 tablet (100 mg total) by mouth daily. 30 tablet 5  . triamcinolone cream (KENALOG) 0.1 % Apply 1 application topically 2 (two) times daily as needed (for itching).     No current facility-administered medications for this visit.   Facility-Administered Medications Ordered in Other Visits  Medication Dose Route Frequency Provider Last Rate Last Dose  . 0.9 %  sodium chloride infusion   Intravenous Continuous JanaForest Gleason 10 mL/hr at 11/04/15 1415    . sodium chloride 0.9 % injection 10 mL  10 mL Intracatheter PRN JanaForest Gleason   10 mL at 05/06/15 1028  .  sodium chloride 0.9 % injection 10 mL  10 mL Intravenous PRN Forest Gleason, MD      . sodium chloride 0.9 % injection 10 mL  10 mL Intravenous PRN Forest Gleason, MD        OBJECTIVE:  Filed Vitals:   01/14/16 1441  BP: 128/71  Pulse: 90  Temp: 97.6 F (36.4 C)  Resp: 18     Body mass index is 20.39 kg/(m^2).    ECOG FS:1 - Symptomatic but completely ambulatory  PHYSICAL EXAM: General status: Patient is pale looking and not any acute distress Lymphatic system: Palpable left supraclavicular lymph node.  Has increase in size. Slightly increased size . Abdomen: Mild distention generalized tenderness no palpable masses no ascites Lungs: Air entry equal on both sides.  No crepitation or rhonchi or rales Cardiac: Tachycardia Lower extremity trace swelling Skin: No rash Neurological system no focal sign  LAB RESULTS:  Appointment on 01/14/2016  Component Date Value Ref Range Status  . WBC 01/14/2016 9.6  3.6 - 11.0 K/uL Final  . RBC 01/14/2016 3.72* 3.80 - 5.20 MIL/uL Final  . Hemoglobin 01/14/2016 10.5* 12.0 - 16.0 g/dL Final  . HCT 01/14/2016 31.6* 35.0 - 47.0 % Final  . MCV 01/14/2016 85.0  80.0 - 100.0 fL Final  . MCH 01/14/2016 28.3  26.0 - 34.0 pg Final    . MCHC 01/14/2016 33.3  32.0 - 36.0 g/dL Final  . RDW 01/14/2016 14.4  11.5 - 14.5 % Final  . Platelets 01/14/2016 318  150 - 440 K/uL Final  . Neutrophils Relative % 01/14/2016 94   Final  . Neutro Abs 01/14/2016 9.0* 1.4 - 6.5 K/uL Final  . Lymphocytes Relative 01/14/2016 3   Final  . Lymphs Abs 01/14/2016 0.3* 1.0 - 3.6 K/uL Final  . Monocytes Relative 01/14/2016 3   Final  . Monocytes Absolute 01/14/2016 0.3  0.2 - 0.9 K/uL Final  . Eosinophils Relative 01/14/2016 0   Final  . Eosinophils Absolute 01/14/2016 0.0  0 - 0.7 K/uL Final  . Basophils Relative 01/14/2016 0   Final  . Basophils Absolute 01/14/2016 0.0  0 - 0.1 K/uL Final  . Sodium 01/14/2016 128* 135 - 145 mmol/L Final  . Potassium 01/14/2016 4.5  3.5 - 5.1 mmol/L Final  . Chloride 01/14/2016 98* 101 - 111 mmol/L Final  . CO2 01/14/2016 23  22 - 32 mmol/L Final  . Glucose, Bld 01/14/2016 166* 65 - 99 mg/dL Final  . BUN 01/14/2016 27* 6 - 20 mg/dL Final  . Creatinine, Ser 01/14/2016 1.44* 0.44 - 1.00 mg/dL Final  . Calcium 01/14/2016 8.6* 8.9 - 10.3 mg/dL Final  . Total Protein 01/14/2016 6.8  6.5 - 8.1 g/dL Final  . Albumin 01/14/2016 3.4* 3.5 - 5.0 g/dL Final  . AST 01/14/2016 12* 15 - 41 U/L Final  . ALT 01/14/2016 8* 14 - 54 U/L Final  . Alkaline Phosphatase 01/14/2016 115  38 - 126 U/L Final  . Total Bilirubin 01/14/2016 0.5  0.3 - 1.2 mg/dL Final  . GFR calc non Af Amer 01/14/2016 37* >60 mL/min Final  . GFR calc Af Amer 01/14/2016 42* >60 mL/min Final   Comment: (NOTE) The eGFR has been calculated using the CKD EPI equation. This calculation has not been validated in all clinical situations. eGFR's persistently <60 mL/min signify possible Chronic Kidney Disease.   . Anion gap 01/14/2016 7  5 - 15 Final    Lab Results  Component Value Date   CA125  18.8 07/23/2014    ASSESSMENT: Carcinoma of endometrium  Stage IV disease. Proceed with PDL 1 testing IV fluid as patient's cream creatinine is high and BUN  is high in oral intake is decreased Continue supportive therapy Prednisone 20 mg daily to increase appetite Had amitriptyline 25 mg by mouth bedtime for generalized itching and rash if there is no improvement he will would be discontinued and patient would be started on prednisone   disease appears to be stable. Regarding pain control.  Patient is using fentanyl patch patient was advised to use breakthrough pain medication more frequently if needed a. Renal functions are slightly abnormal but stable We will continue intermittent IV hydration.  Patient is accompanied with the family and I had prolonged discussion again with the patient regarding limited treatment options.  We go back and start patient on Megace because of poor appetite will start Megace 40 mg per mL 10 ML by mouth twice a day. Reevaluate patient in 2 weeks Other options of palliative care had been discussed.  Referral to a local hospice care also has been discussed Total duration of visit was 56mnutes.  50% or more time was spent in counseling patient and family regarding prognosis and options of treatment and available resources . Cancer of uterus   Staging form: Corpus Uteri - Carcinoma, AJCC 7th Edition     Clinical: Stage IVB (T3b, N2, M1) - Signed by JForest Gleason MD on 04/18/2015   JForest Gleason MD   01/14/2016 3:01 PM

## 2016-01-14 NOTE — Progress Notes (Signed)
Patient states she is having right hip pain.  Wants to know if she needs to see orthopedic MD.

## 2016-01-15 ENCOUNTER — Encounter: Payer: Self-pay | Admitting: Oncology

## 2016-01-15 ENCOUNTER — Ambulatory Visit
Admission: RE | Admit: 2016-01-15 | Discharge: 2016-01-15 | Disposition: A | Discharge status: Home / Self Care | Payer: Medicare Other | Source: Ambulatory Visit | Attending: Oncology | Admitting: Oncology

## 2016-01-15 ENCOUNTER — Other Ambulatory Visit: Payer: Self-pay | Admitting: Oncology

## 2016-01-15 ENCOUNTER — Other Ambulatory Visit: Payer: Self-pay | Admitting: *Deleted

## 2016-01-15 ENCOUNTER — Other Ambulatory Visit: Payer: Medicare Other

## 2016-01-15 ENCOUNTER — Ambulatory Visit: Payer: Medicare Other | Admitting: Oncology

## 2016-01-15 ENCOUNTER — Ambulatory Visit: Payer: Medicare Other

## 2016-01-15 DIAGNOSIS — M25551 Pain in right hip: Secondary | ICD-10-CM | POA: Diagnosis not present

## 2016-01-15 DIAGNOSIS — C541 Malignant neoplasm of endometrium: Secondary | ICD-10-CM

## 2016-01-15 DIAGNOSIS — C55 Malignant neoplasm of uterus, part unspecified: Secondary | ICD-10-CM

## 2016-01-16 ENCOUNTER — Inpatient Hospital Stay: Payer: Medicare Other

## 2016-01-16 VITALS — BP 116/69 | HR 103 | Temp 97.4°F | Resp 18

## 2016-01-16 DIAGNOSIS — C541 Malignant neoplasm of endometrium: Secondary | ICD-10-CM

## 2016-01-16 MED ORDER — SODIUM CHLORIDE 0.9 % IV SOLN
Freq: Once | INTRAVENOUS | Status: AC
  Administered 2016-01-16: 11:00:00 via INTRAVENOUS
  Filled 2016-01-16: qty 2

## 2016-01-16 MED ORDER — SODIUM CHLORIDE 0.9 % IV SOLN
Freq: Once | INTRAVENOUS | Status: AC
  Administered 2016-01-16: 11:00:00 via INTRAVENOUS
  Filled 2016-01-16: qty 1000

## 2016-01-16 MED ORDER — SODIUM CHLORIDE 0.9 % IJ SOLN
10.0000 mL | INTRAMUSCULAR | Status: DC | PRN
  Administered 2016-01-16: 10 mL
  Filled 2016-01-16: qty 10

## 2016-01-16 MED ORDER — HEPARIN SOD (PORK) LOCK FLUSH 100 UNIT/ML IV SOLN
500.0000 [IU] | Freq: Once | INTRAVENOUS | Status: AC | PRN
  Administered 2016-01-16: 500 [IU]

## 2016-01-16 MED ORDER — HEPARIN SOD (PORK) LOCK FLUSH 100 UNIT/ML IV SOLN
INTRAVENOUS | Status: AC
Start: 2016-01-16 — End: 2016-01-16
  Filled 2016-01-16: qty 5

## 2016-01-19 ENCOUNTER — Other Ambulatory Visit: Payer: Self-pay | Admitting: Oncology

## 2016-01-20 ENCOUNTER — Other Ambulatory Visit: Payer: Self-pay | Admitting: Oncology

## 2016-01-23 ENCOUNTER — Inpatient Hospital Stay: Payer: Medicare Other

## 2016-01-23 ENCOUNTER — Inpatient Hospital Stay (HOSPITAL_BASED_OUTPATIENT_CLINIC_OR_DEPARTMENT_OTHER): Payer: Medicare Other | Admitting: Oncology

## 2016-01-23 ENCOUNTER — Encounter: Payer: Self-pay | Admitting: Oncology

## 2016-01-23 VITALS — BP 148/78 | HR 103 | Temp 98.4°F | Resp 18 | Wt 115.3 lb

## 2016-01-23 DIAGNOSIS — E860 Dehydration: Secondary | ICD-10-CM

## 2016-01-23 DIAGNOSIS — Z79899 Other long term (current) drug therapy: Secondary | ICD-10-CM

## 2016-01-23 DIAGNOSIS — C55 Malignant neoplasm of uterus, part unspecified: Secondary | ICD-10-CM

## 2016-01-23 DIAGNOSIS — R21 Rash and other nonspecific skin eruption: Secondary | ICD-10-CM

## 2016-01-23 DIAGNOSIS — C541 Malignant neoplasm of endometrium: Secondary | ICD-10-CM

## 2016-01-23 DIAGNOSIS — R197 Diarrhea, unspecified: Secondary | ICD-10-CM

## 2016-01-23 DIAGNOSIS — M25551 Pain in right hip: Secondary | ICD-10-CM

## 2016-01-23 DIAGNOSIS — F419 Anxiety disorder, unspecified: Secondary | ICD-10-CM

## 2016-01-23 DIAGNOSIS — C772 Secondary and unspecified malignant neoplasm of intra-abdominal lymph nodes: Secondary | ICD-10-CM

## 2016-01-23 DIAGNOSIS — R112 Nausea with vomiting, unspecified: Secondary | ICD-10-CM | POA: Diagnosis not present

## 2016-01-23 LAB — CBC WITH DIFFERENTIAL/PLATELET
BASOS PCT: 0 %
Basophils Absolute: 0 10*3/uL (ref 0–0.1)
Eosinophils Absolute: 0 10*3/uL (ref 0–0.7)
Eosinophils Relative: 0 %
HEMATOCRIT: 30.9 % — AB (ref 35.0–47.0)
HEMOGLOBIN: 10.2 g/dL — AB (ref 12.0–16.0)
LYMPHS ABS: 0.2 10*3/uL — AB (ref 1.0–3.6)
LYMPHS PCT: 2 %
MCH: 28.1 pg (ref 26.0–34.0)
MCHC: 33 g/dL (ref 32.0–36.0)
MCV: 85.3 fL (ref 80.0–100.0)
MONO ABS: 0.2 10*3/uL (ref 0.2–0.9)
MONOS PCT: 1 %
NEUTROS ABS: 11.3 10*3/uL — AB (ref 1.4–6.5)
NEUTROS PCT: 97 %
Platelets: 327 10*3/uL (ref 150–440)
RBC: 3.63 MIL/uL — ABNORMAL LOW (ref 3.80–5.20)
RDW: 15.2 % — ABNORMAL HIGH (ref 11.5–14.5)
WBC: 11.7 10*3/uL — ABNORMAL HIGH (ref 3.6–11.0)

## 2016-01-23 LAB — COMPREHENSIVE METABOLIC PANEL
ALBUMIN: 3.3 g/dL — AB (ref 3.5–5.0)
ALK PHOS: 140 U/L — AB (ref 38–126)
ALT: 36 U/L (ref 14–54)
ANION GAP: 9 (ref 5–15)
AST: 26 U/L (ref 15–41)
BILIRUBIN TOTAL: 0.5 mg/dL (ref 0.3–1.2)
BUN: 31 mg/dL — AB (ref 6–20)
CALCIUM: 8.9 mg/dL (ref 8.9–10.3)
CO2: 18 mmol/L — ABNORMAL LOW (ref 22–32)
Chloride: 97 mmol/L — ABNORMAL LOW (ref 101–111)
Creatinine, Ser: 1.39 mg/dL — ABNORMAL HIGH (ref 0.44–1.00)
GFR calc Af Amer: 44 mL/min — ABNORMAL LOW (ref >60)
GFR, EST NON AFRICAN AMERICAN: 38 mL/min — AB (ref >60)
GLUCOSE: 191 mg/dL — AB (ref 65–99)
Potassium: 4.6 mmol/L (ref 3.5–5.1)
Sodium: 124 mmol/L — ABNORMAL LOW (ref 135–145)
TOTAL PROTEIN: 7.3 g/dL (ref 6.5–8.1)

## 2016-01-23 LAB — MAGNESIUM: MAGNESIUM: 2 mg/dL (ref 1.7–2.4)

## 2016-01-23 MED ORDER — SODIUM CHLORIDE 0.9 % IV SOLN
Freq: Once | INTRAVENOUS | Status: AC
  Administered 2016-01-23: 14:00:00 via INTRAVENOUS
  Filled 2016-01-23: qty 1000

## 2016-01-23 MED ORDER — SODIUM CHLORIDE 0.9 % IJ SOLN
10.0000 mL | INTRAMUSCULAR | Status: DC | PRN
  Filled 2016-01-23: qty 10

## 2016-01-23 MED ORDER — SODIUM CHLORIDE 0.9 % IV SOLN
8.0000 mg | Freq: Once | INTRAVENOUS | Status: AC
  Administered 2016-01-23: 8 mg via INTRAVENOUS
  Filled 2016-01-23: qty 0.8

## 2016-01-23 MED ORDER — HEPARIN SOD (PORK) LOCK FLUSH 100 UNIT/ML IV SOLN
500.0000 [IU] | Freq: Once | INTRAVENOUS | Status: AC | PRN
  Administered 2016-01-23: 500 [IU]

## 2016-01-23 MED ORDER — FENTANYL 25 MCG/HR TD PT72: 25.0000 ug | MEDICATED_PATCH | TRANSDERMAL | Status: DC

## 2016-01-23 MED ORDER — FENTANYL 12 MCG/HR TD PT72: 12.5000 ug | MEDICATED_PATCH | TRANSDERMAL | Status: DC

## 2016-01-23 NOTE — Progress Notes (Signed)
[place] @ [facility] [phone]  [phone]     Alyanna Stoermer OB: 1951-11-20  MR#: [id number]  ION#:[id number]  [name], MD as PCP - General (Family Medicine) [name], MD (General Surgery)  CHIEF COMPLAINT:  Chief Complaint  Patient presents with  . Malignant neoplasm of endometrium    Chief Complaint/Diagnosis:   The patient is a 65 year old with metastatic endometrial cancer who is seen for assessment during ongoing palliative radiation therapy.  04/2011   papillary serous adenocarcinoma of the endometrium, stage IIIb (nodal metastasis)              TAHBSO and full staging,               GOG protocl using XRT and chemotherapy, significant XRT side-effects,               continued on chemotherapy off protocol, 04/2013   recurrence in the nodal areas,              DDP and docetaxel x 6 completed in December,   started on chemotherapy with Adriamycin(October, 2015) poor tolerance to letrozole  and affinator Estrogen and progesterone receptor negative tumor         hER-2/neu recetor negative. November 07, 2014 progressive disease by  tumor marker criteria and clinical examination November 12, 2014 After 6 cycles of chemotherapy because of increasing side effect chemotherapy was discontinued.  (March, 2016) Patient was started on cis-platinum and topotecan. May, 2016 Cis-platinum and topotecan  and was discontinued because of significant side effect and possibility of progressive disease.  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  Patient is here for ongoing evaluation and treatment consideration Patient had frequent diarrhea yesterday.  Prior to that patient's appetite has been stable.  Feeling somewhat  better.  No nausea no vomiting. REVIEW OF SYSTEMS:    general status: Patient is feeling weak and tired.  Feeling lightheaded No change in a performance status.  No chills.  No fever.  Feeling extremely weak and tired HEENT: Alopecia.  No evidence of stomatitis increasing discomfort on the left side of the neck Lungs: No cough or shortness of breath Cardiac: No chest pain or paroxysmal nocturnal dyspnea GI: He had 6 yesterday On stay patient followed by black stool. Skin: Macular rash grade 2, as described above.  Patient is taking Zyrtec in the morning and Benadryl in the night   Lower extremity no swelling Neurological system: No tingling.  No numbness.  No other focal signs Patient is somewhat depressed.  Has not lost any weight. PAST MEDICAL HISTORY: Past Medical History  Diagnosis Date  . Cancer ([place]) 2012  . GI problem 2013  . Cancer of uterus ([place]) 04/18/2015  . Uterine cancer ([place])     PAST SURGICAL HISTORY: Past Surgical History  Procedure Laterality Date  . Abdominal hysterectomy    . Tonsillectomy      FAMILY HISTORY No significant family history.      ADVANCED DIRECTIVES: Patient does have advance directive   HEALTH MAINTENANCE: Social History  Substance Use Topics  . Smoking status: Never Smoker   . Smokeless tobacco: Never Used  . Alcohol Use: 0.0 oz/week    0 Standard drinks or equivalent per week  Allergies  Allergen Reactions  . Penicillins Other (See Comments)    Reaction:  Unknown     Current Outpatient Prescriptions  Medication Sig Dispense Refill  . ALPRAZolam (XANAX) 0.5 MG tablet Take 1 tablet (0.5 mg total) by mouth 2 (two) times daily. 60 tablet 0  . amitriptyline (ELAVIL) 25 MG tablet Take 1 tablet (25 mg total) by mouth at bedtime. 30 tablet 3  . Ca Carbonate-Mag Hydroxide (ROLAIDS) 550-110 MG CHEW Chew 2 tablets by mouth as needed (for indigestion).    . diphenhydrAMINE (BENADRYL) 25 MG tablet Take 25 mg by mouth at bedtime.      . fentaNYL (DURAGESIC - DOSED MCG/HR) 12 MCG/HR Place 1 patch (12.5 mcg total) onto the skin every 3 (three) days. Use with 54mg patch for total dose of 37.581m. 10 patch 0  . fentaNYL (DURAGESIC - DOSED MCG/HR) 25 MCG/HR patch Place 1 patch (25 mcg total) onto the skin every 3 (three) days. Use with 12.13m42mpatch for total dose 37.13mc57m10 patch 0  . fexofenadine (ALLEGRA) 180 MG tablet Take 180 mg by mouth daily.     . fluticasone (FLONASE) 50 MCG/ACT nasal spray Place 1-2 sprays into the nose daily as needed for allergies or rhinitis.     . HYMarland KitchenROcodone-acetaminophen (NORCO/VICODIN) 5-325 MG tablet Take 1 tablet by mouth every 6 (six) hours as needed for moderate pain. 60 tablet 0  . hydroxypropyl methylcellulose / hypromellose (ISOPTO TEARS / GONIOVISC) 2.5 % ophthalmic solution Place 1 drop into the left eye 3 (three) times daily. 15 mL 12  . lactulose (CHRONULAC) 10 GM/15ML solution Take 45 mLs (30 g total) by mouth daily as needed for severe constipation. 240 mL 0  . meclizine (ANTIVERT) 25 MG tablet Take 25 mg by mouth 4 (four) times daily as needed for dizziness.    . megestrol (MEGACE) 400 MG/10ML suspension Take 10 mLs (400 mg total) by mouth 2 (two) times daily. 240 mL 0  . metoCLOPramide (REGLAN) 10 MG tablet Take 1 tablet (10 mg total) by mouth 4 (four) times daily as needed for nausea. 60 tablet 3  . ondansetron (ZOFRAN) 4 MG tablet Take 1 tablet (4 mg total) by mouth every 6 (six) hours as needed for nausea or vomiting. 60 tablet 3  . polyethylene glycol (MIRALAX / GLYCOLAX) packet Take 17 g by mouth daily. 30 each 0  . potassium chloride (K-DUR) 10 MEQ tablet TAKE 1 TABLET(10 MEQ) BY MOUTH DAILY 90 tablet 3  . predniSONE (DELTASONE) 20 MG tablet Take 1 tablet (20 mg total) by mouth daily with breakfast. 30 tablet 3  . sertraline (ZOLOFT) 50 MG tablet TAKE 1 TABLET BY MOUTH EVERY MORNING 30 tablet 0  . sertraline (ZOLOFT) 50 MG tablet TAKE 1 TABLET BY MOUTH EVERY MORNING 30 tablet 0   . triamcinolone cream (KENALOG) 0.1 % Apply 1 application topically 2 (two) times daily as needed (for itching).    . mirtazapine (REMERON) 15 MG tablet   3   No current facility-administered medications for this visit.   Facility-Administered Medications Ordered in Other Visits  Medication Dose Route Frequency Provider Last Rate Last Dose  . 0.9 %  sodium chloride infusion   Intravenous Continuous JanaForest Gleason 10 mL/hr at 11/04/15 1415    . sodium chloride 0.9 % injection 10 mL  10 mL Intracatheter PRN JanaForest Gleason   10 mL at 05/06/15 1028  . sodium chloride 0.9 % injection 10 mL  10 mL Intravenous PRN JanaDelorise Shiner  Karder Goodin, MD      . sodium chloride 0.9 % injection 10 mL  10 mL Intravenous PRN Forest Gleason, MD        OBJECTIVE:  Filed Vitals:   01/23/16 1338  BP: 148/78  Pulse: 103  Temp: 98.4 F (36.9 C)  Resp: 18     Body mass index is 20.43 kg/(m^2).    ECOG FS:1 - Symptomatic but completely ambulatory  PHYSICAL EXAM: General status: Patient is pale looking and not any acute distress Lymphatic system: Palpable left supraclavicular lymph node.  Has increase in size. Slightly increased size . Abdomen: Mild distention generalized tenderness no palpable masses no ascites Lungs: Air entry equal on both sides.  No crepitation or rhonchi or rales Cardiac: Tachycardia Lower extremity trace swelling Skin: No rash Neurological system no focal sign  LAB RESULTS:  Appointment on 01/23/2016  Component Date Value Ref Range Status  . WBC 01/23/2016 11.7* 3.6 - 11.0 K/uL Final  . RBC 01/23/2016 3.63* 3.80 - 5.20 MIL/uL Final  . Hemoglobin 01/23/2016 10.2* 12.0 - 16.0 g/dL Final  . HCT 01/23/2016 30.9* 35.0 - 47.0 % Final  . MCV 01/23/2016 85.3  80.0 - 100.0 fL Final  . MCH 01/23/2016 28.1  26.0 - 34.0 pg Final  . MCHC 01/23/2016 33.0  32.0 - 36.0 g/dL Final  . RDW 01/23/2016 15.2* 11.5 - 14.5 % Final  . Platelets 01/23/2016 327  150 - 440 K/uL Final  . Neutrophils Relative %  01/23/2016 97   Final  . Neutro Abs 01/23/2016 11.3* 1.4 - 6.5 K/uL Final  . Lymphocytes Relative 01/23/2016 2   Final  . Lymphs Abs 01/23/2016 0.2* 1.0 - 3.6 K/uL Final  . Monocytes Relative 01/23/2016 1   Final  . Monocytes Absolute 01/23/2016 0.2  0.2 - 0.9 K/uL Final  . Eosinophils Relative 01/23/2016 0   Final  . Eosinophils Absolute 01/23/2016 0.0  0 - 0.7 K/uL Final  . Basophils Relative 01/23/2016 0   Final  . Basophils Absolute 01/23/2016 0.0  0 - 0.1 K/uL Final  . Sodium 01/23/2016 124* 135 - 145 mmol/L Final  . Potassium 01/23/2016 4.6  3.5 - 5.1 mmol/L Final  . Chloride 01/23/2016 97* 101 - 111 mmol/L Final  . CO2 01/23/2016 18* 22 - 32 mmol/L Final  . Glucose, Bld 01/23/2016 191* 65 - 99 mg/dL Final  . BUN 01/23/2016 31* 6 - 20 mg/dL Final  . Creatinine, Ser 01/23/2016 1.39* 0.44 - 1.00 mg/dL Final  . Calcium 01/23/2016 8.9  8.9 - 10.3 mg/dL Final  . Total Protein 01/23/2016 7.3  6.5 - 8.1 g/dL Final  . Albumin 01/23/2016 3.3* 3.5 - 5.0 g/dL Final  . AST 01/23/2016 26  15 - 41 U/L Final  . ALT 01/23/2016 36  14 - 54 U/L Final  . Alkaline Phosphatase 01/23/2016 140* 38 - 126 U/L Final  . Total Bilirubin 01/23/2016 0.5  0.3 - 1.2 mg/dL Final  . GFR calc non Af Amer 01/23/2016 38* >60 mL/min Final  . GFR calc Af Amer 01/23/2016 44* >60 mL/min Final   Comment: (NOTE) The eGFR has been calculated using the CKD EPI equation. This calculation has not been validated in all clinical situations. eGFR's persistently <60 mL/min signify possible Chronic Kidney Disease.   . Anion gap 01/23/2016 9  5 - 15 Final  . Magnesium 01/23/2016 2.0  1.7 - 2.4 mg/dL Final    Lab Results  Component Value Date   CA125 18.8 07/23/2014  ASSESSMENT: Carcinoma of endometrium  Stage IV disease. Proceed with PDL 1 testing IV fluid as patient's cream creatinine is high and BUN is high in oral intake is decreased Continue supportive therapy Prednisone 20 mg daily to increase appetite Had  amitriptyline 25 mg by mouth bedtime for generalized itching and rash if there is no improvement he will would be discontinued and patient would be started on prednisone   disease appears to be stable. In control is better. She was encouraged to take Megace liquid  Because of mild dehydration (secondary to diarrhea) to proceed with IV fluid today and reevaluate patient in 2 weeks Patient has been scheduled for cataract surgery any further treatment options will be discussed after cataract surgery. s . Cancer of uterus   Staging form: Corpus Uteri - Carcinoma, AJCC 7th Edition     Clinical: Stage IVB (T3b, N2, M1) - Signed by Forest Gleason, MD on 04/18/2015   Forest Gleason, MD   01/23/2016 1:45 PM

## 2016-01-23 NOTE — Progress Notes (Signed)
Patient states she had diarrhea yesterday X 6 and ended up taking immodium.  Also states her appetite is a little better.  C/o right hip pain.

## 2016-01-29 ENCOUNTER — Telehealth: Payer: Self-pay | Admitting: *Deleted

## 2016-01-29 ENCOUNTER — Other Ambulatory Visit: Payer: Self-pay | Admitting: Oncology

## 2016-01-29 ENCOUNTER — Other Ambulatory Visit: Payer: Self-pay | Admitting: *Deleted

## 2016-01-29 NOTE — Telephone Encounter (Signed)
Called patient to let her know she can get fluids in [facility] tomorrow @ 9 am.  Patient verbalized understanding and appreciative for call.

## 2016-01-29 NOTE — Telephone Encounter (Signed)
Patient states as the day has progressed she has felt weaker and weaker.  Thinks she may be dehydrated and wants to know if she can come in tomorrow for IVF's?

## 2016-01-30 ENCOUNTER — Inpatient Hospital Stay: Payer: Medicare Other

## 2016-01-30 DIAGNOSIS — C541 Malignant neoplasm of endometrium: Secondary | ICD-10-CM

## 2016-01-30 DIAGNOSIS — C55 Malignant neoplasm of uterus, part unspecified: Secondary | ICD-10-CM

## 2016-01-30 MED ORDER — SODIUM CHLORIDE 0.9 % IV SOLN
Freq: Once | INTRAVENOUS | Status: AC
  Administered 2016-01-30: 09:00:00 via INTRAVENOUS
  Filled 2016-01-30: qty 2

## 2016-01-30 MED ORDER — HEPARIN SOD (PORK) LOCK FLUSH 100 UNIT/ML IV SOLN
500.0000 [IU] | Freq: Once | INTRAVENOUS | Status: AC | PRN
  Administered 2016-01-30: 500 [IU]
  Filled 2016-01-30: qty 5

## 2016-01-30 MED ORDER — ONDANSETRON HCL 4 MG/2ML IJ SOLN
INTRAMUSCULAR | Status: AC
  Filled 2016-01-30: qty 2

## 2016-01-30 MED ORDER — SODIUM CHLORIDE 0.9 % IJ SOLN
10.0000 mL | INTRAMUSCULAR | Status: DC | PRN
  Administered 2016-01-30: 10 mL
  Filled 2016-01-30: qty 10

## 2016-01-30 MED ORDER — DEXAMETHASONE SODIUM PHOSPHATE 10 MG/ML IJ SOLN
INTRAMUSCULAR | Status: AC
  Filled 2016-01-30: qty 1

## 2016-01-30 MED ORDER — SODIUM CHLORIDE 0.9 % IV SOLN
Freq: Once | INTRAVENOUS | Status: AC
  Administered 2016-01-30: 09:00:00 via INTRAVENOUS
  Filled 2016-01-30: qty 1000

## 2016-02-04 ENCOUNTER — Encounter: Payer: Self-pay | Admitting: *Deleted

## 2016-02-04 ENCOUNTER — Telehealth: Payer: Self-pay | Admitting: *Deleted

## 2016-02-04 DIAGNOSIS — C541 Malignant neoplasm of endometrium: Secondary | ICD-10-CM

## 2016-02-04 MED ORDER — ALPRAZOLAM 0.5 MG PO TABS: 0.5000 mg | ORAL_TABLET | Freq: Two times a day (BID) | ORAL | Status: AC

## 2016-02-04 NOTE — Telephone Encounter (Signed)
Called patient and left message that new prescription for Xanax 0.5 is being faxed to pharmacy.

## 2016-02-04 NOTE — Telephone Encounter (Signed)
rx for 0.5mg  will be faxed to pharmacy.

## 2016-02-04 NOTE — Telephone Encounter (Signed)
Pharmacy filled incorrect dosage on patient's Xanax.  Dr. Oliva Bustard increased it to 0.5 and pharmacy filled 0.25.  Can we call in 0.5?  Thanks!

## 2016-02-05 ENCOUNTER — Inpatient Hospital Stay: Payer: Medicare Other | Attending: Oncology

## 2016-02-05 ENCOUNTER — Inpatient Hospital Stay: Payer: Medicare Other

## 2016-02-05 ENCOUNTER — Inpatient Hospital Stay (HOSPITAL_BASED_OUTPATIENT_CLINIC_OR_DEPARTMENT_OTHER): Payer: Medicare Other | Admitting: Oncology

## 2016-02-05 VITALS — BP 182/84 | HR 91 | Temp 98.4°F | Resp 18 | Wt 116.8 lb

## 2016-02-05 DIAGNOSIS — D649 Anemia, unspecified: Secondary | ICD-10-CM

## 2016-02-05 DIAGNOSIS — C541 Malignant neoplasm of endometrium: Secondary | ICD-10-CM | POA: Insufficient documentation

## 2016-02-05 DIAGNOSIS — R21 Rash and other nonspecific skin eruption: Secondary | ICD-10-CM | POA: Diagnosis not present

## 2016-02-05 DIAGNOSIS — C772 Secondary and unspecified malignant neoplasm of intra-abdominal lymph nodes: Secondary | ICD-10-CM | POA: Diagnosis not present

## 2016-02-05 DIAGNOSIS — Z79899 Other long term (current) drug therapy: Secondary | ICD-10-CM | POA: Diagnosis not present

## 2016-02-05 DIAGNOSIS — M25551 Pain in right hip: Secondary | ICD-10-CM

## 2016-02-05 DIAGNOSIS — Z9221 Personal history of antineoplastic chemotherapy: Secondary | ICD-10-CM | POA: Diagnosis not present

## 2016-02-05 DIAGNOSIS — C55 Malignant neoplasm of uterus, part unspecified: Secondary | ICD-10-CM

## 2016-02-05 LAB — CBC WITH DIFFERENTIAL/PLATELET
BASOS ABS: 0 10*3/uL (ref 0–0.1)
BASOS PCT: 0 %
EOS PCT: 0 %
Eosinophils Absolute: 0 10*3/uL (ref 0–0.7)
HEMATOCRIT: 28.9 % — AB (ref 35.0–47.0)
Hemoglobin: 9.6 g/dL — ABNORMAL LOW (ref 12.0–16.0)
Lymphocytes Relative: 3 %
Lymphs Abs: 0.4 10*3/uL — ABNORMAL LOW (ref 1.0–3.6)
MCH: 28.9 pg (ref 26.0–34.0)
MCHC: 33.2 g/dL (ref 32.0–36.0)
MCV: 87 fL (ref 80.0–100.0)
MONO ABS: 0.3 10*3/uL (ref 0.2–0.9)
MONOS PCT: 3 %
NEUTROS ABS: 10.1 10*3/uL — AB (ref 1.4–6.5)
Neutrophils Relative %: 94 %
PLATELETS: 367 10*3/uL (ref 150–440)
RBC: 3.33 MIL/uL — ABNORMAL LOW (ref 3.80–5.20)
RDW: 16.5 % — AB (ref 11.5–14.5)
WBC: 10.8 10*3/uL (ref 3.6–11.0)

## 2016-02-05 LAB — COMPREHENSIVE METABOLIC PANEL
ALBUMIN: 3.1 g/dL — AB (ref 3.5–5.0)
ALT: 22 U/L (ref 14–54)
ANION GAP: 7 (ref 5–15)
AST: 12 U/L — ABNORMAL LOW (ref 15–41)
Alkaline Phosphatase: 105 U/L (ref 38–126)
BILIRUBIN TOTAL: 0.4 mg/dL (ref 0.3–1.2)
BUN: 32 mg/dL — ABNORMAL HIGH (ref 6–20)
CHLORIDE: 100 mmol/L — AB (ref 101–111)
CO2: 19 mmol/L — ABNORMAL LOW (ref 22–32)
Calcium: 8.3 mg/dL — ABNORMAL LOW (ref 8.9–10.3)
Creatinine, Ser: 1.17 mg/dL — ABNORMAL HIGH (ref 0.44–1.00)
GFR calc Af Amer: 55 mL/min — ABNORMAL LOW (ref >60)
GFR, EST NON AFRICAN AMERICAN: 47 mL/min — AB (ref >60)
Glucose, Bld: 234 mg/dL — ABNORMAL HIGH (ref 65–99)
POTASSIUM: 4.3 mmol/L (ref 3.5–5.1)
Sodium: 126 mmol/L — ABNORMAL LOW (ref 135–145)
TOTAL PROTEIN: 7.1 g/dL (ref 6.5–8.1)

## 2016-02-05 LAB — MAGNESIUM: Magnesium: 1.9 mg/dL (ref 1.7–2.4)

## 2016-02-05 MED ORDER — HEPARIN SOD (PORK) LOCK FLUSH 100 UNIT/ML IV SOLN
500.0000 [IU] | Freq: Once | INTRAVENOUS | Status: AC
Start: 2016-02-05 — End: 2016-02-05
  Administered 2016-02-05: 500 [IU] via INTRAVENOUS

## 2016-02-05 MED ORDER — MEGESTROL ACETATE 400 MG/10ML PO SUSP: 400.0000 mg | Freq: Two times a day (BID) | ORAL | Status: AC

## 2016-02-05 NOTE — Progress Notes (Signed)
Patient states by this time of the week she is very sluggish and weak.  Asking if she can get IVF's today?

## 2016-02-06 ENCOUNTER — Inpatient Hospital Stay: Payer: Medicare Other

## 2016-02-06 VITALS — BP 132/74 | HR 88 | Temp 97.3°F | Resp 18

## 2016-02-06 DIAGNOSIS — C541 Malignant neoplasm of endometrium: Secondary | ICD-10-CM | POA: Diagnosis not present

## 2016-02-06 DIAGNOSIS — C55 Malignant neoplasm of uterus, part unspecified: Secondary | ICD-10-CM

## 2016-02-06 MED ORDER — SODIUM CHLORIDE 0.9 % IV SOLN
Freq: Once | INTRAVENOUS | Status: AC
  Administered 2016-02-06: 11:00:00 via INTRAVENOUS
  Filled 2016-02-06: qty 4

## 2016-02-06 MED ORDER — SODIUM CHLORIDE 0.9 % IV SOLN
Freq: Once | INTRAVENOUS | Status: AC
  Administered 2016-02-06: 11:00:00 via INTRAVENOUS
  Filled 2016-02-06: qty 1000

## 2016-02-06 MED ORDER — HEPARIN SOD (PORK) LOCK FLUSH 100 UNIT/ML IV SOLN
500.0000 [IU] | Freq: Once | INTRAVENOUS | Status: AC | PRN
  Administered 2016-02-06: 500 [IU]
  Filled 2016-02-06: qty 5

## 2016-02-06 NOTE — Discharge Instructions (Signed)

## 2016-02-08 ENCOUNTER — Encounter: Payer: Self-pay | Admitting: Oncology

## 2016-02-08 NOTE — Progress Notes (Signed)
[place] @ [facility] [phone]  Fax:(336) [phone]     [name] Graham OB: 1951-10-02  MR#: [id number]  YIR#:[id number]  [name], MD as PCP - General (Family Medicine) [name], MD (General Surgery)  CHIEF COMPLAINT:  Chief Complaint  Patient presents with  . Malignant neoplasm of endometrium    Chief Complaint/Diagnosis:   The patient is a 65 year old with metastatic endometrial cancer who is seen for assessment during ongoing palliative radiation therapy.  04/2011   papillary serous adenocarcinoma of the endometrium, stage IIIb (nodal metastasis)              TAHBSO and full staging,               GOG protocl using XRT and chemotherapy, significant XRT side-effects,               continued on chemotherapy off protocol, 04/2013   recurrence in the nodal areas,              DDP and docetaxel x 6 completed in December,   started on chemotherapy with Adriamycin(October, 2015) poor tolerance to letrozole  and affinator Estrogen and progesterone receptor negative tumor         hER-2/neu recetor negative. November 07, 2014 progressive disease by  tumor marker criteria and clinical examination November 12, 2014 After 6 cycles of chemotherapy because of increasing side effect chemotherapy was discontinued.  (March, 2016) Patient was started on cis-platinum and topotecan. May, 2016 Cis-platinum and topotecan  and was discontinued because of significant side effect and possibility of progressive disease.  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  Patient is here for ongoing evaluation and treatment consideration Patient is here for ongoing evaluation regarding progressive endometrial cancer.Marland Kitchen  REVIEW OF SYSTEMS:    general status: Patient is feeling weak and tired.  Feeling lightheaded No change in a performance status.  No chills.  No fever.  Feeling extremely weak and tired HEENT: Alopecia.  No evidence of stomatitis increasing discomfort on the left side of the neck Lungs: No cough or shortness of breath Cardiac: No chest pain or paroxysmal nocturnal dyspnea GI: He had 6 yesterday On stay patient followed by black stool. Skin: Macular rash grade 2, as described above.  Patient is taking Zyrtec in the morning and Benadryl in the night   Lower extremity no swelling Neurological system: No tingling.  No numbness.  No other focal signs Patient continues to have poor oral intake.  Intermittent abdominal pain control with some fentanyl patch and breakthrough pain medication  requiring intravenous fluid off and on.  Patient is off all chemotherapy at present time  PAST MEDICAL HISTORY: Past Medical History  Diagnosis Date  . Cancer ([place]) 2012  . GI problem 2013  . Cancer of uterus ([place]) 04/18/2015  . Uterine cancer ([place])   . Wears hearing aid     bilateral    PAST SURGICAL HISTORY: Past Surgical History  Procedure Laterality Date  . Abdominal hysterectomy    . Tonsillectomy      FAMILY HISTORY No significant family history.      ADVANCED DIRECTIVES: Patient does have advance directive   HEALTH MAINTENANCE: Social History  Substance Use Topics  . Smoking status: Never Smoker   .  Smokeless tobacco: Never Used  . Alcohol Use: 0.0 oz/week    0 Standard drinks or equivalent per week      Allergies  Allergen Reactions  . Penicillins Rash         Current Outpatient Prescriptions  Medication Sig Dispense Refill  . acetaminophen (TYLENOL) 500 MG tablet Take 500 mg by mouth every 6 (six) hours as needed.    . ALPRAZolam (XANAX) 0.5 MG tablet Take 1 tablet (0.5 mg total) by mouth 2 (two) times daily. 60 tablet 3  . amitriptyline (ELAVIL) 25 MG tablet Take 1 tablet (25 mg total) by  mouth at bedtime. 30 tablet 3  . Ca Carbonate-Mag Hydroxide (ROLAIDS) 550-110 MG CHEW Chew 2 tablets by mouth as needed (for indigestion).    . cetirizine (ZYRTEC) 10 MG tablet Take 10 mg by mouth daily.    . diphenhydrAMINE (BENADRYL) 25 MG tablet Take 25 mg by mouth at bedtime.    . docusate sodium (COLACE) 100 MG capsule Take 100 mg by mouth 2 (two) times daily as needed for mild constipation.    . fentaNYL (DURAGESIC - DOSED MCG/HR) 12 MCG/HR Place 1 patch (12.5 mcg total) onto the skin every 3 (three) days. Use with 74mg patch for total dose of 37.529m. 10 patch 0  . fentaNYL (DURAGESIC - DOSED MCG/HR) 25 MCG/HR patch Place 1 patch (25 mcg total) onto the skin every 3 (three) days. Use with 12.27m52mpatch for total dose 37.27mc46m10 patch 0  . fluticasone (FLONASE) 50 MCG/ACT nasal spray Place 1-2 sprays into the nose daily as needed for allergies or rhinitis.     . HYMarland KitchenROcodone-acetaminophen (NORCO/VICODIN) 5-325 MG tablet Take 1 tablet by mouth every 6 (six) hours as needed for moderate pain. 60 tablet 0  . hydroxypropyl methylcellulose / hypromellose (ISOPTO TEARS / GONIOVISC) 2.5 % ophthalmic solution Place 1 drop into the left eye 3 (three) times daily. 15 mL 12  . lactulose (CHRONULAC) 10 GM/15ML solution Take 45 mLs (30 g total) by mouth daily as needed for severe constipation. 240 mL 0  . meclizine (ANTIVERT) 25 MG tablet Take 25 mg by mouth 4 (four) times daily as needed for dizziness.    . megestrol (MEGACE) 400 MG/10ML suspension Take 10 mLs (400 mg total) by mouth 2 (two) times daily. 480 mL 3  . metoCLOPramide (REGLAN) 10 MG tablet Take 1 tablet (10 mg total) by mouth 4 (four) times daily as needed for nausea. 60 tablet 3  . mirtazapine (REMERON) 15 MG tablet   3  . omeprazole (PRILOSEC) 20 MG capsule Take 20 mg by mouth daily.    . ondansetron (ZOFRAN) 4 MG tablet Take 1 tablet (4 mg total) by mouth every 6 (six) hours as needed for nausea or vomiting. 60 tablet 3  . polyethylene  glycol (MIRALAX / GLYCOLAX) packet Take 17 g by mouth daily. 30 each 0  . potassium chloride (K-DUR) 10 MEQ tablet TAKE 1 TABLET(10 MEQ) BY MOUTH DAILY 90 tablet 3  . predniSONE (DELTASONE) 20 MG tablet Take 1 tablet (20 mg total) by mouth daily with breakfast. 30 tablet 3  . sertraline (ZOLOFT) 50 MG tablet TAKE 1 TABLET BY MOUTH EVERY MORNING 30 tablet 0  . triamcinolone cream (KENALOG) 0.1 % Apply 1 application topically 2 (two) times daily as needed (for itching).    . VIMarland KitchenAMOX 0.5 % ophthalmic solution      No current facility-administered medications for this visit.   Facility-Administered Medications Ordered in Other Visits  Medication Dose Route Frequency Provider Last Rate Last Dose  . 0.9 %  sodium chloride infusion   Intravenous Continuous Forest Gleason, MD 10 mL/hr at 11/04/15 1415    . sodium chloride 0.9 % injection 10 mL  10 mL Intracatheter PRN Forest Gleason, MD   10 mL at 05/06/15 1028  . sodium chloride 0.9 % injection 10 mL  10 mL Intravenous PRN Forest Gleason, MD      . sodium chloride 0.9 % injection 10 mL  10 mL Intravenous PRN Forest Gleason, MD        OBJECTIVE:  Filed Vitals:   02/05/16 1448  BP: 182/84  Pulse: 91  Temp: 98.4 F (36.9 C)  Resp: 18     Body mass index is 20.7 kg/(m^2).    ECOG FS:1 - Symptomatic but completely ambulatory  PHYSICAL EXAM: General status: Patient is pale looking and not any acute distress Lymphatic system: Palpable left supraclavicular lymph node.  Is increasing in the size.   Slightly increased size . Abdomen: Mild distention generalized tenderness no palpable masses no ascites Lungs: Air entry equal on both sides.  No crepitation or rhonchi or rales Cardiac: Tachycardia Lower extremity trace swelling Skin: No rash Neurological system no focal sign  LAB RESULTS:  Appointment on 02/05/2016  Component Date Value Ref Range Status  . WBC 02/05/2016 10.8  3.6 - 11.0 K/uL Final  . RBC 02/05/2016 3.33* 3.80 - 5.20 MIL/uL Final  .  Hemoglobin 02/05/2016 9.6* 12.0 - 16.0 g/dL Final  . HCT 02/05/2016 28.9* 35.0 - 47.0 % Final  . MCV 02/05/2016 87.0  80.0 - 100.0 fL Final  . MCH 02/05/2016 28.9  26.0 - 34.0 pg Final  . MCHC 02/05/2016 33.2  32.0 - 36.0 g/dL Final  . RDW 02/05/2016 16.5* 11.5 - 14.5 % Final  . Platelets 02/05/2016 367  150 - 440 K/uL Final  . Neutrophils Relative % 02/05/2016 94   Final  . Neutro Abs 02/05/2016 10.1* 1.4 - 6.5 K/uL Final  . Lymphocytes Relative 02/05/2016 3   Final  . Lymphs Abs 02/05/2016 0.4* 1.0 - 3.6 K/uL Final  . Monocytes Relative 02/05/2016 3   Final  . Monocytes Absolute 02/05/2016 0.3  0.2 - 0.9 K/uL Final  . Eosinophils Relative 02/05/2016 0   Final  . Eosinophils Absolute 02/05/2016 0.0  0 - 0.7 K/uL Final  . Basophils Relative 02/05/2016 0   Final  . Basophils Absolute 02/05/2016 0.0  0 - 0.1 K/uL Final  . Sodium 02/05/2016 126* 135 - 145 mmol/L Final  . Potassium 02/05/2016 4.3  3.5 - 5.1 mmol/L Final  . Chloride 02/05/2016 100* 101 - 111 mmol/L Final  . CO2 02/05/2016 19* 22 - 32 mmol/L Final  . Glucose, Bld 02/05/2016 234* 65 - 99 mg/dL Final  . BUN 02/05/2016 32* 6 - 20 mg/dL Final  . Creatinine, Ser 02/05/2016 1.17* 0.44 - 1.00 mg/dL Final  . Calcium 02/05/2016 8.3* 8.9 - 10.3 mg/dL Final  . Total Protein 02/05/2016 7.1  6.5 - 8.1 g/dL Final  . Albumin 02/05/2016 3.1* 3.5 - 5.0 g/dL Final  . AST 02/05/2016 12* 15 - 41 U/L Final  . ALT 02/05/2016 22  14 - 54 U/L Final  . Alkaline Phosphatase 02/05/2016 105  38 - 126 U/L Final  . Total Bilirubin 02/05/2016 0.4  0.3 - 1.2 mg/dL Final  . GFR calc non Af Amer 02/05/2016 47* >60 mL/min Final  . GFR calc Af Amer 02/05/2016 55* >60 mL/min  Final   Comment: (NOTE) The eGFR has been calculated using the CKD EPI equation. This calculation has not been validated in all clinical situations. eGFR's persistently <60 mL/min signify possible Chronic Kidney Disease.   . Anion gap 02/05/2016 7  5 - 15 Final  . Magnesium  02/05/2016 1.9  1.7 - 2.4 mg/dL Final    Lab Results  Component Value Date   CA125 18.8 07/23/2014    ASSESSMENT: Carcinoma of endometrium  Stage IV disease.  Continue supportive therapy Prednisone 20 mg daily to increase appetite Had amitriptyline 25 mg by mouth bedtime for generalized itching and rash if there is no improvement he will would be discontinued and patient would be started on prednisone   disease appears to be stable.PAIN IS  under better control Appetite is improved after patient started Megace Kidneys require IV fluid or other lab data has been reviewed   Progressive disease (stage IV endometrial cancer) patient has failed multiple chemotherapy programs and will continue to look for any available clinical trial .Anemia: Multifactorial  No need for any transfusion as present time  Appointment will be made with GYN oncologist to see if any clinical trials available at [facility] of uterus   Staging form: Corpus Uteri - Carcinoma, AJCC 7th Edition     Clinical: Stage IVB (T3b, N2, M1) - Signed by Forest Gleason, MD on 04/18/2015   Forest Gleason, MD   02/08/2016 4:15 PM

## 2016-02-11 ENCOUNTER — Ambulatory Visit
Admission: RE | Admit: 2016-02-11 | Discharge: 2016-02-11 | Disposition: A | Discharge status: Home / Self Care | Payer: Medicare Other | Source: Ambulatory Visit | Attending: Ophthalmology | Admitting: Ophthalmology

## 2016-02-11 ENCOUNTER — Encounter
Admission: RE | Disposition: A | Discharge status: Home / Self Care | Payer: Self-pay | Source: Ambulatory Visit | Attending: Ophthalmology

## 2016-02-11 ENCOUNTER — Ambulatory Visit: Payer: Medicare Other | Admitting: Anesthesiology

## 2016-02-11 DIAGNOSIS — Z7952 Long term (current) use of systemic steroids: Secondary | ICD-10-CM | POA: Diagnosis not present

## 2016-02-11 DIAGNOSIS — H9193 Unspecified hearing loss, bilateral: Secondary | ICD-10-CM | POA: Insufficient documentation

## 2016-02-11 DIAGNOSIS — Z90710 Acquired absence of both cervix and uterus: Secondary | ICD-10-CM | POA: Diagnosis not present

## 2016-02-11 DIAGNOSIS — Z9109 Other allergy status, other than to drugs and biological substances: Secondary | ICD-10-CM | POA: Diagnosis not present

## 2016-02-11 DIAGNOSIS — F329 Major depressive disorder, single episode, unspecified: Secondary | ICD-10-CM | POA: Insufficient documentation

## 2016-02-11 DIAGNOSIS — Z79891 Long term (current) use of opiate analgesic: Secondary | ICD-10-CM | POA: Diagnosis not present

## 2016-02-11 DIAGNOSIS — H2512 Age-related nuclear cataract, left eye: Secondary | ICD-10-CM | POA: Diagnosis not present

## 2016-02-11 DIAGNOSIS — Z79899 Other long term (current) drug therapy: Secondary | ICD-10-CM | POA: Diagnosis not present

## 2016-02-11 DIAGNOSIS — Z880 Allergy status to penicillin: Secondary | ICD-10-CM | POA: Insufficient documentation

## 2016-02-11 DIAGNOSIS — Z9889 Other specified postprocedural states: Secondary | ICD-10-CM | POA: Diagnosis not present

## 2016-02-11 DIAGNOSIS — Z8572 Personal history of non-Hodgkin lymphomas: Secondary | ICD-10-CM | POA: Diagnosis not present

## 2016-02-11 DIAGNOSIS — Z8542 Personal history of malignant neoplasm of other parts of uterus: Secondary | ICD-10-CM | POA: Diagnosis not present

## 2016-02-11 DIAGNOSIS — H269 Unspecified cataract: Secondary | ICD-10-CM | POA: Diagnosis present

## 2016-02-11 HISTORY — DX: Presence of external hearing-aid: Z97.4

## 2016-02-11 HISTORY — PX: CATARACT EXTRACTION W/PHACO: SHX586

## 2016-02-11 SURGERY — PHACOEMULSIFICATION, CATARACT, WITH IOL INSERTION
Anesthesia: Monitor Anesthesia Care | Laterality: Left | Wound class: Clean

## 2016-02-11 MED ORDER — BRIMONIDINE TARTRATE 0.2 % OP SOLN
OPHTHALMIC | Status: DC | PRN
  Administered 2016-02-11: 1 [drp] via OPHTHALMIC

## 2016-02-11 MED ORDER — ARMC OPHTHALMIC DILATING GEL
1.0000 "application " | OPHTHALMIC | Status: DC | PRN
  Administered 2016-02-11: 1 via OPHTHALMIC

## 2016-02-11 MED ORDER — NA HYALUR & NA CHOND-NA HYALUR 0.4-0.35 ML IO KIT
PACK | INTRAOCULAR | Status: DC | PRN
  Administered 2016-02-11: 1 mL via INTRAOCULAR

## 2016-02-11 MED ORDER — POVIDONE-IODINE 5 % OP SOLN
1.0000 "application " | OPHTHALMIC | Status: DC | PRN
  Administered 2016-02-11: 1 via OPHTHALMIC

## 2016-02-11 MED ORDER — TETRACAINE HCL 0.5 % OP SOLN
1.0000 [drp] | OPHTHALMIC | Status: DC | PRN
  Administered 2016-02-11: 1 [drp] via OPHTHALMIC

## 2016-02-11 MED ORDER — EPINEPHRINE HCL 1 MG/ML IJ SOLN
INTRAMUSCULAR | Status: DC | PRN
  Administered 2016-02-11: 60 mL via OPHTHALMIC

## 2016-02-11 MED ORDER — MIDAZOLAM HCL 2 MG/2ML IJ SOLN
INTRAMUSCULAR | Status: DC | PRN
Start: 2016-02-11 — End: 2016-02-11
  Administered 2016-02-11: 2 mg via INTRAVENOUS

## 2016-02-11 MED ORDER — FENTANYL CITRATE (PF) 100 MCG/2ML IJ SOLN
INTRAMUSCULAR | Status: DC | PRN
  Administered 2016-02-11: 100 ug via INTRAVENOUS

## 2016-02-11 MED ORDER — TIMOLOL MALEATE 0.5 % OP SOLN
OPHTHALMIC | Status: DC | PRN
  Administered 2016-02-11: 1 [drp] via OPHTHALMIC

## 2016-02-11 MED ORDER — CEFUROXIME OPHTHALMIC INJECTION 1 MG/0.1 ML
INJECTION | OPHTHALMIC | Status: DC | PRN
  Administered 2016-02-11: 0.1 mL via INTRACAMERAL

## 2016-02-11 SURGICAL SUPPLY — 26 items
CANNULA ANT/CHMB 27GA (MISCELLANEOUS) ×3 IMPLANT
CARTRIDGE ABBOTT (MISCELLANEOUS) ×3 IMPLANT
GLOVE SURG LX 7.5 STRW (GLOVE) ×2
GLOVE SURG LX STRL 7.5 STRW (GLOVE) ×1 IMPLANT
GLOVE SURG TRIUMPH 8.0 PF LTX (GLOVE) ×3 IMPLANT
GOWN STRL REUS W/ TWL LRG LVL3 (GOWN DISPOSABLE) ×2 IMPLANT
GOWN STRL REUS W/TWL LRG LVL3 (GOWN DISPOSABLE) ×4
LENS IOL TECNIS ITEC 21.5 (Intraocular Lens) ×3 IMPLANT
MARKER SKIN DUAL TIP RULER LAB (MISCELLANEOUS) ×3 IMPLANT
NDL RETROBULBAR .5 NSTRL (NEEDLE) IMPLANT
NEEDLE FILTER BLUNT 18X 1/2SAF (NEEDLE) ×2
NEEDLE FILTER BLUNT 18X1 1/2 (NEEDLE) ×1 IMPLANT
PACK CATARACT BRASINGTON (MISCELLANEOUS) ×3 IMPLANT
PACK EYE AFTER SURG (MISCELLANEOUS) ×3 IMPLANT
PACK OPTHALMIC (MISCELLANEOUS) ×3 IMPLANT
RING MALYGIN 7.0 (MISCELLANEOUS) IMPLANT
SUT ETHILON 10-0 CS-B-6CS-B-6 (SUTURE)
SUT VICRYL  9 0 (SUTURE)
SUT VICRYL 9 0 (SUTURE) IMPLANT
SUTURE EHLN 10-0 CS-B-6CS-B-6 (SUTURE) IMPLANT
SYR 3ML LL SCALE MARK (SYRINGE) ×3 IMPLANT
SYR 5ML LL (SYRINGE) IMPLANT
SYR TB 1ML LUER SLIP (SYRINGE) ×3 IMPLANT
WATER STERILE IRR 250ML POUR (IV SOLUTION) ×3 IMPLANT
WATER STERILE IRR 500ML POUR (IV SOLUTION) IMPLANT
WIPE NON LINTING 3.25X3.25 (MISCELLANEOUS) ×3 IMPLANT

## 2016-02-11 NOTE — Op Note (Signed)
OPERATIVE NOTE  Shayna Furrer [id number] 02/11/2016   PREOPERATIVE DIAGNOSIS:  Nuclear sclerotic cataract left eye. H25.12   POSTOPERATIVE DIAGNOSIS:    Nuclear sclerotic cataract left eye.     PROCEDURE:  Phacoemusification with posterior chamber intraocular lens placement of the left eye   LENS:   Implant Name Type Inv. Item Serial No. Manufacturer Lot No. LRB No. Used  technis aspheric iol pre-loaded     [id number] [facility]   Left 1     PCBOO 21.5 D PCIOL   ULTRASOUND TIME: 19.5  % of 1 minutes 32 seconds, CDE 17.8  SURGEON:  Wyonia Hough, MD   ANESTHESIA:  Topical with tetracaine drops and 2% Xylocaine jelly.   COMPLICATIONS:  None.   DESCRIPTION OF PROCEDURE:  The patient was identified in the holding room and transported to the operating room and placed in the supine position under the operating microscope.  The left eye was identified as the operative eye and it was prepped and draped in the usual sterile ophthalmic fashion.   A 1 millimeter clear-corneal paracentesis was made at the 1:30 position.  The anterior chamber was filled with Viscoat viscoelastic.  A 2.4 millimeter keratome was used to make a near-clear corneal incision at the 10:30 position.  .  A curvilinear capsulorrhexis was made with a cystotome and capsulorrhexis forceps.  Balanced salt solution was used to hydrodissect and hydrodelineate the nucleus.   Phacoemulsification was then used in stop and chop fashion to remove the lens nucleus and epinucleus.  The remaining cortex was then removed using the irrigation and aspiration handpiece. Provisc was then placed into the capsular bag to distend it for lens placement.  A lens was then injected into the capsular bag.  The remaining viscoelastic was aspirated.   Wounds were hydrated with balanced salt solution.  The anterior chamber was inflated to a physiologic pressure with balanced salt solution.  No wound leaks were noted. Cefuroxime 0.1 ml of a  10mg /ml solution was injected into the anterior chamber for a dose of 1 mg of intracameral antibiotic at the completion of the case.   Timolol and Brimonidine drops were applied to the eye.  The patient was taken to the recovery room in stable condition without complications of anesthesia or surgery.  Dolores Ewing 02/11/2016, 11:01 AM

## 2016-02-11 NOTE — Anesthesia Procedure Notes (Signed)
Procedure Name: MAC Performed by: Shahir Zhoey Pre-anesthesia Checklist: Patient identified, Emergency Drugs available, Suction available, Timeout performed and Patient being monitored Patient Re-evaluated:Patient Re-evaluated prior to inductionOxygen Delivery Method: Nasal cannula Placement Confirmation: positive ETCO2     

## 2016-02-11 NOTE — H&P (Signed)
  The History and Physical notes are on paper, have been signed, and are to be scanned. The patient remains stable and unchanged from the H&P.   Previous H&P reviewed, patient examined, and there are no changes.  [name] Graham 02/11/2016 10:33 AM

## 2016-02-11 NOTE — Transfer of Care (Signed)
Immediate Anesthesia Transfer of Care Note  Patient: [name] Graham  Procedure(s) Performed: Procedure(s): CATARACT EXTRACTION PHACO AND INTRAOCULAR LENS PLACEMENT (IOC) (Left)  Patient Location: PACU  Anesthesia Type: MAC  Level of Consciousness: awake, alert  and patient cooperative  Airway and Oxygen Therapy: Patient Spontanous Breathing and Patient connected to supplemental oxygen  Post-op Assessment: Post-op Vital signs reviewed, Patient's Cardiovascular Status Stable, Respiratory Function Stable, Patent Airway and No signs of Nausea or vomiting  Post-op Vital Signs: Reviewed and stable  Complications: No apparent anesthesia complications

## 2016-02-11 NOTE — Anesthesia Postprocedure Evaluation (Signed)
Anesthesia Post Note  Patient: [name] Graham  Procedure(s) Performed: Procedure(s) (LRB): CATARACT EXTRACTION PHACO AND INTRAOCULAR LENS PLACEMENT (IOC) (Left)  Patient location during evaluation: PACU Anesthesia Type: MAC Level of consciousness: awake and alert and oriented Pain management: pain level controlled Vital Signs Assessment: post-procedure vital signs reviewed and stable Respiratory status: spontaneous breathing and nonlabored ventilation Cardiovascular status: stable Postop Assessment: no signs of nausea or vomiting and adequate PO intake Anesthetic complications: no    Estill Batten

## 2016-02-11 NOTE — Anesthesia Preprocedure Evaluation (Signed)
Anesthesia Evaluation   Patient awake    Reviewed: Allergy & Precautions, NPO status , Patient's Chart, lab work & pertinent test results, reviewed documented beta blocker date and time   History of Anesthesia Complications Negative for: history of anesthetic complications  Airway Mallampati: I  TM Distance: >3 FB Neck ROM: Full    Dental no notable dental hx.    Pulmonary neg pulmonary ROS,    Pulmonary exam normal        Cardiovascular negative cardio ROS Normal cardiovascular exam     Neuro/Psych PSYCHIATRIC DISORDERS Depression negative neurological ROS     GI/Hepatic negative GI ROS, Neg liver ROS,   Endo/Other  negative endocrine ROS  Renal/GU negative Renal ROS     Musculoskeletal negative musculoskeletal ROS (+)   Abdominal   Peds  Hematology  (+) anemia ,   Anesthesia Other Findings   Reproductive/Obstetrics                             Anesthesia Physical Anesthesia Plan  ASA: II  Anesthesia Plan: MAC   Post-op Pain Management:    Induction: Intravenous  Airway Management Planned:   Additional Equipment:   Intra-op Plan:   Post-operative Plan:   Informed Consent: I have reviewed the patients History and Physical, chart, labs and discussed the procedure including the risks, benefits and alternatives for the proposed anesthesia with the patient or authorized representative who has indicated his/her understanding and acceptance.     Plan Discussed with: CRNA  Anesthesia Plan Comments:         Anesthesia Quick Evaluation

## 2016-02-12 ENCOUNTER — Encounter: Payer: Self-pay | Admitting: Ophthalmology

## 2016-02-12 ENCOUNTER — Other Ambulatory Visit: Payer: Self-pay | Admitting: Oncology

## 2016-02-12 ENCOUNTER — Telehealth: Payer: Self-pay | Admitting: *Deleted

## 2016-02-12 MED ORDER — OMEPRAZOLE 20 MG PO CPDR: 20.0000 mg | DELAYED_RELEASE_CAPSULE | Freq: Every day | ORAL | Status: DC

## 2016-02-12 NOTE — Telephone Encounter (Signed)
Called patient to let her know prescription has been sent to pharmacy.

## 2016-02-12 NOTE — Telephone Encounter (Signed)
Refill sent to pharmacy.   

## 2016-02-12 NOTE — Telephone Encounter (Signed)
Patient requesting refill for omeprazole 20 mg.

## 2016-02-17 ENCOUNTER — Encounter: Payer: Self-pay | Admitting: Oncology

## 2016-02-17 ENCOUNTER — Inpatient Hospital Stay: Payer: Medicare Other

## 2016-02-17 ENCOUNTER — Inpatient Hospital Stay (HOSPITAL_BASED_OUTPATIENT_CLINIC_OR_DEPARTMENT_OTHER): Payer: Medicare Other | Admitting: Oncology

## 2016-02-17 VITALS — BP 143/79 | HR 111 | Temp 99.1°F | Resp 18 | Wt 117.7 lb

## 2016-02-17 DIAGNOSIS — C772 Secondary and unspecified malignant neoplasm of intra-abdominal lymph nodes: Secondary | ICD-10-CM | POA: Diagnosis not present

## 2016-02-17 DIAGNOSIS — D649 Anemia, unspecified: Secondary | ICD-10-CM | POA: Diagnosis not present

## 2016-02-17 DIAGNOSIS — C541 Malignant neoplasm of endometrium: Secondary | ICD-10-CM

## 2016-02-17 DIAGNOSIS — Z79899 Other long term (current) drug therapy: Secondary | ICD-10-CM

## 2016-02-17 DIAGNOSIS — C55 Malignant neoplasm of uterus, part unspecified: Secondary | ICD-10-CM

## 2016-02-17 DIAGNOSIS — Z9221 Personal history of antineoplastic chemotherapy: Secondary | ICD-10-CM

## 2016-02-17 LAB — CBC WITH DIFFERENTIAL/PLATELET
Basophils Absolute: 0 10*3/uL (ref 0–0.1)
Basophils Relative: 0 %
Eosinophils Absolute: 0 10*3/uL (ref 0–0.7)
Eosinophils Relative: 0 %
HEMATOCRIT: 28.6 % — AB (ref 35.0–47.0)
HEMOGLOBIN: 9.4 g/dL — AB (ref 12.0–16.0)
LYMPHS ABS: 0.2 10*3/uL — AB (ref 1.0–3.6)
LYMPHS PCT: 2 %
MCH: 28.9 pg (ref 26.0–34.0)
MCHC: 32.8 g/dL (ref 32.0–36.0)
MCV: 88.2 fL (ref 80.0–100.0)
Monocytes Absolute: 0.3 10*3/uL (ref 0.2–0.9)
Monocytes Relative: 3 %
NEUTROS ABS: 9.6 10*3/uL — AB (ref 1.4–6.5)
NEUTROS PCT: 95 %
Platelets: 342 10*3/uL (ref 150–440)
RBC: 3.24 MIL/uL — AB (ref 3.80–5.20)
RDW: 17.7 % — ABNORMAL HIGH (ref 11.5–14.5)
WBC: 10.2 10*3/uL (ref 3.6–11.0)

## 2016-02-17 LAB — COMPREHENSIVE METABOLIC PANEL
ALK PHOS: 92 U/L (ref 38–126)
ALT: 27 U/L (ref 14–54)
AST: 20 U/L (ref 15–41)
Albumin: 2.9 g/dL — ABNORMAL LOW (ref 3.5–5.0)
Anion gap: 8 (ref 5–15)
BUN: 41 mg/dL — AB (ref 6–20)
CALCIUM: 8.6 mg/dL — AB (ref 8.9–10.3)
CO2: 20 mmol/L — ABNORMAL LOW (ref 22–32)
CREATININE: 1.4 mg/dL — AB (ref 0.44–1.00)
Chloride: 102 mmol/L (ref 101–111)
GFR, EST AFRICAN AMERICAN: 44 mL/min — AB (ref >60)
GFR, EST NON AFRICAN AMERICAN: 38 mL/min — AB (ref >60)
Glucose, Bld: 176 mg/dL — ABNORMAL HIGH (ref 65–99)
Potassium: 4.5 mmol/L (ref 3.5–5.1)
Sodium: 130 mmol/L — ABNORMAL LOW (ref 135–145)
Total Bilirubin: 0.5 mg/dL (ref 0.3–1.2)
Total Protein: 7.4 g/dL (ref 6.5–8.1)

## 2016-02-17 LAB — MAGNESIUM: MAGNESIUM: 2.1 mg/dL (ref 1.7–2.4)

## 2016-02-17 MED ORDER — SODIUM CHLORIDE 0.9% FLUSH
10.0000 mL | Freq: Once | INTRAVENOUS | Status: AC
  Administered 2016-02-17: 10 mL via INTRAVENOUS
  Filled 2016-02-17: qty 10

## 2016-02-17 MED ORDER — FENTANYL 25 MCG/HR TD PT72: 25.0000 ug | MEDICATED_PATCH | TRANSDERMAL | Status: DC

## 2016-02-17 MED ORDER — HEPARIN SOD (PORK) LOCK FLUSH 100 UNIT/ML IV SOLN
500.0000 [IU] | Freq: Once | INTRAVENOUS | Status: AC
  Administered 2016-02-17: 500 [IU] via INTRAVENOUS
  Filled 2016-02-17: qty 5

## 2016-02-17 MED ORDER — FENTANYL 12 MCG/HR TD PT72: 12.5000 ug | MEDICATED_PATCH | TRANSDERMAL | Status: DC

## 2016-02-17 MED ORDER — SODIUM CHLORIDE 0.9 % IV SOLN
Freq: Once | INTRAVENOUS | Status: AC
  Administered 2016-02-17: 14:00:00 via INTRAVENOUS
  Filled 2016-02-17: qty 2

## 2016-02-17 MED ORDER — SODIUM CHLORIDE 0.9 % IV SOLN
Freq: Once | INTRAVENOUS | Status: AC
  Administered 2016-02-17: 14:00:00 via INTRAVENOUS
  Filled 2016-02-17: qty 1000

## 2016-02-17 NOTE — Progress Notes (Signed)
[place] @ [facility] [phone]  [phone]     Marigny Borre OB: 01-Jul-1951  MR#: [id number]  XBM#:[id number]  [name], MD as PCP - General (Family Medicine) [name], MD (General Surgery)  CHIEF COMPLAINT:  Chief Complaint  Patient presents with  . Endometrial cancer    Chief Complaint/Diagnosis:   The patient is a 65 year old with metastatic endometrial cancer who is seen for assessment during ongoing palliative radiation therapy.  04/2011   papillary serous adenocarcinoma of the endometrium, stage IIIb (nodal metastasis)              TAHBSO and full staging,               GOG protocl using XRT and chemotherapy, significant XRT side-effects,               continued on chemotherapy off protocol, 04/2013   recurrence in the nodal areas,              DDP and docetaxel x 6 completed in December,   started on chemotherapy with Adriamycin(October, 2015) poor tolerance to letrozole  and affinator Estrogen and progesterone receptor negative tumor         hER-2/neu recetor negative. November 07, 2014 progressive disease by  tumor marker criteria and clinical examination November 12, 2014 After 6 cycles of chemotherapy because of increasing side effect chemotherapy was discontinued.  (March, 2016) Patient was started on cis-platinum and topotecan. May, 2016 Cis-platinum and topotecan  and was discontinued because of significant side effect and possibility of progressive disease.  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  Patient is here for ongoing evaluation and treatment consideration Patient is here for ongoing evaluation regarding progressive  endometrial cancer.. Patient on supportive therapy with Megace and IV fluid intermittently improving.  Diarrhea has resolved.  No abdominal pain.  No nausea.  Left supraclavicular lymph node is enlarged but stable REVIEW OF SYSTEMS:    general status: Patient is feeling weak and tired.  Feeling lightheaded No change in a performance status.  No chills.  No fever.  Feeling extremely weak and tired HEENT: Alopecia.  No evidence of stomatitis increasing discomfort on the left side of the neck Lungs: No cough or shortness of breath Cardiac: No chest pain or paroxysmal nocturnal dyspnea GI: He had 6 yesterday On stay patient followed by black stool. Skin: Macular rash grade 2, as described above.  Patient is taking Zyrtec in the morning and Benadryl in the night   Lower extremity no swelling Neurological system: No tingling.  No numbness.  No other focal signs Patient continues to have poor oral intake.  Intermittent abdominal pain control with some fentanyl patch and breakthrough pain medication  requiring intravenous fluid off and on.  Patient is off all chemotherapy at present time  PAST MEDICAL HISTORY: Past Medical History  Diagnosis Date  . Cancer ([place]) 2012  . GI problem 2013  . Cancer of uterus ([place]) 04/18/2015  . Uterine cancer ([place])   . Wears hearing aid     bilateral    PAST SURGICAL HISTORY: Past Surgical History  Procedure Laterality Date  . Abdominal hysterectomy    . Tonsillectomy    .  Cataract extraction w/phaco Left 02/11/2016    Procedure: CATARACT EXTRACTION PHACO AND INTRAOCULAR LENS PLACEMENT (IOC);  Surgeon: Leandrew Koyanagi, MD;  Location: [place];  Service: Ophthalmology;  Laterality: Left;    FAMILY HISTORY No significant family history.      ADVANCED DIRECTIVES: Patient does have advance directive   HEALTH MAINTENANCE: Social History  Substance Use Topics  . Smoking status: Never Smoker   . Smokeless tobacco: Never Used  . Alcohol Use:  0.0 oz/week    0 Standard drinks or equivalent per week      Allergies  Allergen Reactions  . Penicillins Rash         Current Outpatient Prescriptions  Medication Sig Dispense Refill  . acetaminophen (TYLENOL) 500 MG tablet Take 500 mg by mouth every 6 (six) hours as needed.    . ALPRAZolam (XANAX) 0.5 MG tablet Take 1 tablet (0.5 mg total) by mouth 2 (two) times daily. 60 tablet 3  . amitriptyline (ELAVIL) 25 MG tablet Take 1 tablet (25 mg total) by mouth at bedtime. 30 tablet 3  . Ca Carbonate-Mag Hydroxide (ROLAIDS) 550-110 MG CHEW Chew 2 tablets by mouth as needed (for indigestion).    . cetirizine (ZYRTEC) 10 MG tablet Take 10 mg by mouth daily.    . diphenhydrAMINE (BENADRYL) 25 MG tablet Take 25 mg by mouth at bedtime.    . docusate sodium (COLACE) 100 MG capsule Take 100 mg by mouth 2 (two) times daily as needed for mild constipation.    . fentaNYL (DURAGESIC - DOSED MCG/HR) 12 MCG/HR Place 1 patch (12.5 mcg total) onto the skin every 3 (three) days. Use with 24mg patch for total dose of 37.563m. 10 patch 0  . fentaNYL (DURAGESIC - DOSED MCG/HR) 25 MCG/HR patch Place 1 patch (25 mcg total) onto the skin every 3 (three) days. Use with 12.21m79mpatch for total dose 37.21mc21m10 patch 0  . fluticasone (FLONASE) 50 MCG/ACT nasal spray Place 1-2 sprays into the nose daily as needed for allergies or rhinitis.     . HYMarland KitchenROcodone-acetaminophen (NORCO/VICODIN) 5-325 MG tablet Take 1 tablet by mouth every 6 (six) hours as needed for moderate pain. 60 tablet 0  . hydroxypropyl methylcellulose / hypromellose (ISOPTO TEARS / GONIOVISC) 2.5 % ophthalmic solution Place 1 drop into the left eye 3 (three) times daily. 15 mL 12  . lactulose (CHRONULAC) 10 GM/15ML solution Take 45 mLs (30 g total) by mouth daily as needed for severe constipation. 240 mL 0  . meclizine (ANTIVERT) 25 MG tablet Take 25 mg by mouth 4 (four) times daily as needed for dizziness.    . megestrol (MEGACE) 400 MG/10ML  suspension Take 10 mLs (400 mg total) by mouth 2 (two) times daily. 480 mL 3  . metoCLOPramide (REGLAN) 10 MG tablet Take 1 tablet (10 mg total) by mouth 4 (four) times daily as needed for nausea. 60 tablet 3  . mirtazapine (REMERON) 15 MG tablet   3  . omeprazole (PRILOSEC) 20 MG capsule Take 1 capsule (20 mg total) by mouth daily. 30 capsule 6  . ondansetron (ZOFRAN) 4 MG tablet Take 1 tablet (4 mg total) by mouth every 6 (six) hours as needed for nausea or vomiting. 60 tablet 3  . polyethylene glycol (MIRALAX / GLYCOLAX) packet Take 17 g by mouth daily. 30 each 0  . potassium chloride (K-DUR) 10 MEQ tablet TAKE 1 TABLET(10 MEQ) BY MOUTH DAILY 90 tablet 3  . predniSONE (DELTASONE) 20 MG tablet Take 1  tablet (20 mg total) by mouth daily with breakfast. 30 tablet 3  . sertraline (ZOLOFT) 50 MG tablet TAKE 1 TABLET BY MOUTH EVERY MORNING 30 tablet 0  . sertraline (ZOLOFT) 50 MG tablet TAKE 1 TABLET BY MOUTH EVERY MORNING 30 tablet 0  . triamcinolone cream (KENALOG) 0.1 % Apply 1 application topically 2 (two) times daily as needed (for itching). Reported on 02/11/2016    . VIGAMOX 0.5 % ophthalmic solution      No current facility-administered medications for this visit.   Facility-Administered Medications Ordered in Other Visits  Medication Dose Route Frequency Provider Last Rate Last Dose  . 0.9 %  sodium chloride infusion   Intravenous Continuous Forest Gleason, MD 10 mL/hr at 11/04/15 1415    . heparin lock flush 100 unit/mL  500 Units Intravenous Once Forest Gleason, MD      . sodium chloride 0.9 % injection 10 mL  10 mL Intracatheter PRN Forest Gleason, MD   10 mL at 05/06/15 1028  . sodium chloride 0.9 % injection 10 mL  10 mL Intravenous PRN Forest Gleason, MD      . sodium chloride 0.9 % injection 10 mL  10 mL Intravenous PRN Forest Gleason, MD        OBJECTIVE:  Filed Vitals:   02/17/16 1340  BP: 143/79  Pulse: 111  Temp: 99.1 F (37.3 C)  Resp: 18     Body mass index is 20.86 kg/(m^2).     ECOG FS:1 - Symptomatic but completely ambulatory  PHYSICAL EXAM: General status: Patient is pale looking and not any acute distress Lymphatic system: Palpable left supraclavicular lymph node.  Is increasing in the size.   Slightly increased size . Abdomen: Mild distention generalized tenderness no palpable masses no ascites Lungs: Air entry equal on both sides.  No crepitation or rhonchi or rales Cardiac: Tachycardia Lower extremity trace swelling Skin: No rash Neurological system no focal sign  LAB RESULTS:  Infusion on 02/17/2016  Component Date Value Ref Range Status  . WBC 02/17/2016 10.2  3.6 - 11.0 K/uL Final  . RBC 02/17/2016 3.24* 3.80 - 5.20 MIL/uL Final  . Hemoglobin 02/17/2016 9.4* 12.0 - 16.0 g/dL Final  . HCT 02/17/2016 28.6* 35.0 - 47.0 % Final  . MCV 02/17/2016 88.2  80.0 - 100.0 fL Final  . MCH 02/17/2016 28.9  26.0 - 34.0 pg Final  . MCHC 02/17/2016 32.8  32.0 - 36.0 g/dL Final  . RDW 02/17/2016 17.7* 11.5 - 14.5 % Final  . Platelets 02/17/2016 342  150 - 440 K/uL Final  . Neutrophils Relative % 02/17/2016 95   Final  . Neutro Abs 02/17/2016 9.6* 1.4 - 6.5 K/uL Final  . Lymphocytes Relative 02/17/2016 2   Final  . Lymphs Abs 02/17/2016 0.2* 1.0 - 3.6 K/uL Final  . Monocytes Relative 02/17/2016 3   Final  . Monocytes Absolute 02/17/2016 0.3  0.2 - 0.9 K/uL Final  . Eosinophils Relative 02/17/2016 0   Final  . Eosinophils Absolute 02/17/2016 0.0  0 - 0.7 K/uL Final  . Basophils Relative 02/17/2016 0   Final  . Basophils Absolute 02/17/2016 0.0  0 - 0.1 K/uL Final  . Sodium 02/17/2016 130* 135 - 145 mmol/L Final  . Potassium 02/17/2016 4.5  3.5 - 5.1 mmol/L Final  . Chloride 02/17/2016 102  101 - 111 mmol/L Final  . CO2 02/17/2016 20* 22 - 32 mmol/L Final  . Glucose, Bld 02/17/2016 176* 65 - 99 mg/dL Final  .  BUN 02/17/2016 41* 6 - 20 mg/dL Final  . Creatinine, Ser 02/17/2016 1.40* 0.44 - 1.00 mg/dL Final  . Calcium 02/17/2016 8.6* 8.9 - 10.3 mg/dL Final    . Total Protein 02/17/2016 7.4  6.5 - 8.1 g/dL Final  . Albumin 02/17/2016 2.9* 3.5 - 5.0 g/dL Final  . AST 02/17/2016 20  15 - 41 U/L Final  . ALT 02/17/2016 27  14 - 54 U/L Final  . Alkaline Phosphatase 02/17/2016 92  38 - 126 U/L Final  . Total Bilirubin 02/17/2016 0.5  0.3 - 1.2 mg/dL Final  . GFR calc non Af Amer 02/17/2016 38* >60 mL/min Final  . GFR calc Af Amer 02/17/2016 44* >60 mL/min Final   Comment: (NOTE) The eGFR has been calculated using the CKD EPI equation. This calculation has not been validated in all clinical situations. eGFR's persistently <60 mL/min signify possible Chronic Kidney Disease.   . Anion gap 02/17/2016 8  5 - 15 Final  . Magnesium 02/17/2016 2.1  1.7 - 2.4 mg/dL Final    Lab Results  Component Value Date   CA125 18.8 07/23/2014    ASSESSMENT: Carcinoma of endometrium  Stage IV disease.Progressing disease patient is off all chemotherapy on supportive therapy with Megace and IV fluid will continue as quality of life has significantly improved.   Progressive disease (stage IV endometrial cancer) patient has failed multiple  Cancer of uterus   Staging form: Corpus Uteri - Carcinoma, AJCC 7th Edition     Clinical: Stage IVB (T3b, N2, M1) - Signed by Forest Gleason, MD on 04/18/2015   Forest Gleason, MD   02/17/2016 1:53 PMy.

## 2016-02-27 ENCOUNTER — Inpatient Hospital Stay (HOSPITAL_BASED_OUTPATIENT_CLINIC_OR_DEPARTMENT_OTHER): Payer: Medicare Other | Admitting: Oncology

## 2016-02-27 ENCOUNTER — Inpatient Hospital Stay: Payer: Medicare Other

## 2016-02-27 ENCOUNTER — Encounter: Payer: Self-pay | Admitting: Oncology

## 2016-02-27 VITALS — BP 152/87 | HR 114 | Temp 99.3°F | Resp 18 | Wt 119.7 lb

## 2016-02-27 DIAGNOSIS — C772 Secondary and unspecified malignant neoplasm of intra-abdominal lymph nodes: Secondary | ICD-10-CM

## 2016-02-27 DIAGNOSIS — D649 Anemia, unspecified: Secondary | ICD-10-CM

## 2016-02-27 DIAGNOSIS — M25551 Pain in right hip: Secondary | ICD-10-CM

## 2016-02-27 DIAGNOSIS — C541 Malignant neoplasm of endometrium: Secondary | ICD-10-CM

## 2016-02-27 DIAGNOSIS — C55 Malignant neoplasm of uterus, part unspecified: Secondary | ICD-10-CM

## 2016-02-27 DIAGNOSIS — Z79899 Other long term (current) drug therapy: Secondary | ICD-10-CM

## 2016-02-27 DIAGNOSIS — R21 Rash and other nonspecific skin eruption: Secondary | ICD-10-CM

## 2016-02-27 DIAGNOSIS — Z9221 Personal history of antineoplastic chemotherapy: Secondary | ICD-10-CM

## 2016-02-27 LAB — CBC WITH DIFFERENTIAL/PLATELET
BASOS ABS: 0 10*3/uL (ref 0–0.1)
BASOS PCT: 0 %
EOS PCT: 0 %
Eosinophils Absolute: 0 10*3/uL (ref 0–0.7)
HCT: 26.6 % — ABNORMAL LOW (ref 35.0–47.0)
Hemoglobin: 8.8 g/dL — ABNORMAL LOW (ref 12.0–16.0)
Lymphocytes Relative: 3 %
Lymphs Abs: 0.3 10*3/uL — ABNORMAL LOW (ref 1.0–3.6)
MCH: 29.5 pg (ref 26.0–34.0)
MCHC: 33.1 g/dL (ref 32.0–36.0)
MCV: 89.2 fL (ref 80.0–100.0)
Monocytes Absolute: 0.2 10*3/uL (ref 0.2–0.9)
Monocytes Relative: 2 %
Neutro Abs: 10.5 10*3/uL — ABNORMAL HIGH (ref 1.4–6.5)
Neutrophils Relative %: 95 %
PLATELETS: 332 10*3/uL (ref 150–440)
RBC: 2.98 MIL/uL — AB (ref 3.80–5.20)
RDW: 18.7 % — ABNORMAL HIGH (ref 11.5–14.5)
WBC: 11 10*3/uL (ref 3.6–11.0)

## 2016-02-27 LAB — URINALYSIS COMPLETE WITH MICROSCOPIC (ARMC ONLY)
BACTERIA UA: NONE SEEN
Bilirubin Urine: NEGATIVE
Glucose, UA: NEGATIVE mg/dL
HGB URINE DIPSTICK: NEGATIVE
Ketones, ur: NEGATIVE mg/dL
Nitrite: NEGATIVE
PH: 5 (ref 5.0–8.0)
PROTEIN: NEGATIVE mg/dL
SPECIFIC GRAVITY, URINE: 1.018 (ref 1.005–1.030)

## 2016-02-27 LAB — BASIC METABOLIC PANEL
ANION GAP: 10 (ref 5–15)
BUN: 39 mg/dL — ABNORMAL HIGH (ref 6–20)
CO2: 19 mmol/L — ABNORMAL LOW (ref 22–32)
Calcium: 8.6 mg/dL — ABNORMAL LOW (ref 8.9–10.3)
Chloride: 101 mmol/L (ref 101–111)
Creatinine, Ser: 1.36 mg/dL — ABNORMAL HIGH (ref 0.44–1.00)
GFR, EST AFRICAN AMERICAN: 46 mL/min — AB (ref >60)
GFR, EST NON AFRICAN AMERICAN: 39 mL/min — AB (ref >60)
GLUCOSE: 188 mg/dL — AB (ref 65–99)
POTASSIUM: 4.7 mmol/L (ref 3.5–5.1)
SODIUM: 130 mmol/L — AB (ref 135–145)

## 2016-02-27 MED ORDER — SODIUM CHLORIDE 0.9 % IV SOLN
Freq: Once | INTRAVENOUS | Status: AC
  Administered 2016-02-27: 15:00:00 via INTRAVENOUS
  Filled 2016-02-27: qty 1000

## 2016-02-27 MED ORDER — HEPARIN SOD (PORK) LOCK FLUSH 100 UNIT/ML IV SOLN
500.0000 [IU] | Freq: Once | INTRAVENOUS | Status: AC
Start: 2016-02-27 — End: 2016-02-27
  Administered 2016-02-27: 500 [IU] via INTRAVENOUS

## 2016-02-27 MED ORDER — SODIUM CHLORIDE 0.9% FLUSH
10.0000 mL | INTRAVENOUS | Status: DC | PRN
  Administered 2016-02-27: 10 mL via INTRAVENOUS
  Filled 2016-02-27: qty 10

## 2016-02-27 MED ORDER — SODIUM CHLORIDE 0.9 % IV SOLN
INTRAVENOUS | Status: DC
  Administered 2016-02-27: 15:00:00 via INTRAVENOUS
  Filled 2016-02-27: qty 1000

## 2016-02-27 MED ORDER — HYDROCODONE-ACETAMINOPHEN 5-325 MG PO TABS: 1.0000 | ORAL_TABLET | Freq: Four times a day (QID) | ORAL | Status: DC | PRN

## 2016-02-27 MED ORDER — SODIUM CHLORIDE 0.9 % IV SOLN
Freq: Once | INTRAVENOUS | Status: AC
  Administered 2016-02-27: 15:00:00 via INTRAVENOUS
  Filled 2016-02-27: qty 2

## 2016-02-27 MED ORDER — HEPARIN SOD (PORK) LOCK FLUSH 100 UNIT/ML IV SOLN
INTRAVENOUS | Status: AC
  Filled 2016-02-27: qty 5

## 2016-02-27 MED ORDER — LEVOFLOXACIN 500 MG PO TABS: 500.0000 mg | ORAL_TABLET | Freq: Every day | ORAL | Status: DC

## 2016-02-27 NOTE — Progress Notes (Signed)
Patient c/o sore throat pain and right flank pain for the past 2 weeks.

## 2016-02-28 ENCOUNTER — Encounter: Payer: Self-pay | Admitting: Oncology

## 2016-02-28 NOTE — Progress Notes (Addendum)
[place] @ [facility] [phone]  [phone]     [name] Graham OB: Sep 25, 1951  MR#: [id number]  VZC#:[id number]  [name], MD as PCP - General (Family Medicine) [name], MD (General Surgery)  CHIEF COMPLAINT:  Chief Complaint  Patient presents with  . Endometrial Cancer    Chief Complaint/Diagnosis:   The patient is a 65 year old with metastatic endometrial cancer who is seen for assessment during ongoing palliative radiation therapy.  04/2011   papillary serous adenocarcinoma of the endometrium, stage IIIb (nodal metastasis)              TAHBSO and full staging,               GOG protocl using XRT and chemotherapy, significant XRT side-effects,               continued on chemotherapy off protocol, 04/2013   recurrence in the nodal areas,              DDP and docetaxel x 6 completed in December,   started on chemotherapy with Adriamycin(October, 2015) poor tolerance to letrozole  and affinator Estrogen and progesterone receptor negative tumor         hER-2/neu recetor negative. November 07, 2014 progressive disease by  tumor marker criteria and clinical examination November 12, 2014 After 6 cycles of chemotherapy because of increasing side effect chemotherapy was discontinued.  (March, 2016) Patient was started on cis-platinum and topotecan. May, 2016 Cis-platinum and topotecan  and was discontinued because of significant side effect and possibility of progressive disease.  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  Patient is here for ongoing evaluation and treatment consideration Patient is here for ongoing evaluation regarding progressive  endometrial cancer.. Patient on supportive therapy with Megace and IV fluid intermittently improving.  Diarrhea has resolved.  No abdominal pain.  No nausea.  Left supraclavicular lymph node is enlarged but stable.  Patient has increasing abdominal pain.  Persistent nausea poor appetite and poor oral intake.  Megace is helping with the appetite. Year for further follow-up and treatment consideration REVIEW OF SYSTEMS:    general status: Patient is feeling weak and tired.  Feeling lightheaded No change in a performance status.  No chills.  No fever.  Feeling extremely weak and tired As described about overall poor oral intake HEENT: Alopecia.  No evidence of stomatitis increasing discomfort on the left side of the neck Lungs: No cough or shortness of breath Cardiac: No chest pain or paroxysmal nocturnal dyspnea GI: Diarrhea as improved.  Abdominal discomfort and pain mainly in the left flank area On stay patient followed by black stool. Skin: Macular rash grade 2, as described above.  Patient is taking Zyrtec in the morning and Benadryl in the night   Lower extremity no swelling Neurological system: No tingling.  No numbness.  No other focal signs Patient continues to have poor oral intake.  Intermittent abdominal pain control with some fentanyl patch and breakthrough pain medication  requiring intravenous fluid off and on.  Patient is off all chemotherapy at present time  PAST MEDICAL HISTORY: Past Medical History  Diagnosis Date  . Cancer ([place]) 2012  . GI problem 2013  .  Cancer of uterus ([place]) 04/18/2015  . Uterine cancer ([place])   . Wears hearing aid     bilateral    PAST SURGICAL HISTORY: Past Surgical History  Procedure Laterality Date  . Abdominal hysterectomy    . Tonsillectomy    . Cataract extraction w/phaco Left 02/11/2016    Procedure: CATARACT EXTRACTION PHACO AND INTRAOCULAR LENS PLACEMENT (IOC);  Surgeon: Leandrew Koyanagi, MD;  Location: [facility];  Service:  Ophthalmology;  Laterality: Left;    FAMILY HISTORY No significant family history.      ADVANCED DIRECTIVES: Patient does have advance directive   HEALTH MAINTENANCE: Social History  Substance Use Topics  . Smoking status: Never Smoker   . Smokeless tobacco: Never Used  . Alcohol Use: 0.0 oz/week    0 Standard drinks or equivalent per week      Allergies  Allergen Reactions  . Penicillins Rash         Current Outpatient Prescriptions  Medication Sig Dispense Refill  . acetaminophen (TYLENOL) 500 MG tablet Take 500 mg by mouth every 6 (six) hours as needed.    . ALPRAZolam (XANAX) 0.5 MG tablet Take 1 tablet (0.5 mg total) by mouth 2 (two) times daily. 60 tablet 3  . amitriptyline (ELAVIL) 25 MG tablet Take 1 tablet (25 mg total) by mouth at bedtime. 30 tablet 3  . Ca Carbonate-Mag Hydroxide (ROLAIDS) 550-110 MG CHEW Chew 2 tablets by mouth as needed (for indigestion).    . cetirizine (ZYRTEC) 10 MG tablet Take 10 mg by mouth daily.    . diphenhydrAMINE (BENADRYL) 25 MG tablet Take 25 mg by mouth at bedtime.    . docusate sodium (COLACE) 100 MG capsule Take 100 mg by mouth 2 (two) times daily as needed for mild constipation.    . fentaNYL (DURAGESIC - DOSED MCG/HR) 12 MCG/HR Place 1 patch (12.5 mcg total) onto the skin every 3 (three) days. Use with 7mg patch for total dose of 37.533m. 10 patch 0  . fentaNYL (DURAGESIC - DOSED MCG/HR) 25 MCG/HR patch Place 1 patch (25 mcg total) onto the skin every 3 (three) days. Use with 12.27m16mpatch for total dose 37.27mc13m10 patch 0  . fluticasone (FLONASE) 50 MCG/ACT nasal spray Place 1-2 sprays into the nose daily as needed for allergies or rhinitis.     . HYMarland KitchenROcodone-acetaminophen (NORCO/VICODIN) 5-325 MG tablet Take 1 tablet by mouth every 6 (six) hours as needed for moderate pain. 60 tablet 0  . hydroxypropyl methylcellulose / hypromellose (ISOPTO TEARS / GONIOVISC) 2.5 % ophthalmic solution Place 1 drop into the left eye 3 (three)  times daily. 15 mL 12  . lactulose (CHRONULAC) 10 GM/15ML solution Take 45 mLs (30 g total) by mouth daily as needed for severe constipation. 240 mL 0  . meclizine (ANTIVERT) 25 MG tablet Take 25 mg by mouth 4 (four) times daily as needed for dizziness.    . megestrol (MEGACE) 400 MG/10ML suspension Take 10 mLs (400 mg total) by mouth 2 (two) times daily. 480 mL 3  . metoCLOPramide (REGLAN) 10 MG tablet Take 1 tablet (10 mg total) by mouth 4 (four) times daily as needed for nausea. 60 tablet 3  . mirtazapine (REMERON) 15 MG tablet   3  . omeprazole (PRILOSEC) 20 MG capsule Take 1 capsule (20 mg total) by mouth daily. 30 capsule 6  . ondansetron (ZOFRAN) 4 MG tablet Take 1 tablet (4 mg total) by mouth every 6 (six) hours as needed for nausea or vomiting.  60 tablet 3  . polyethylene glycol (MIRALAX / GLYCOLAX) packet Take 17 g by mouth daily. 30 each 0  . potassium chloride (K-DUR) 10 MEQ tablet TAKE 1 TABLET(10 MEQ) BY MOUTH DAILY 90 tablet 3  . predniSONE (DELTASONE) 20 MG tablet Take 1 tablet (20 mg total) by mouth daily with breakfast. 30 tablet 3  . sertraline (ZOLOFT) 50 MG tablet TAKE 1 TABLET BY MOUTH EVERY MORNING 30 tablet 0  . sertraline (ZOLOFT) 50 MG tablet TAKE 1 TABLET BY MOUTH EVERY MORNING 30 tablet 0  . triamcinolone cream (KENALOG) 0.1 % Apply 1 application topically 2 (two) times daily as needed (for itching). Reported on 02/11/2016    . VIGAMOX 0.5 % ophthalmic solution     . levofloxacin (LEVAQUIN) 500 MG tablet Take 1 tablet (500 mg total) by mouth daily. 7 tablet 0   No current facility-administered medications for this visit.   Facility-Administered Medications Ordered in Other Visits  Medication Dose Route Frequency Provider Last Rate Last Dose  . 0.9 %  sodium chloride infusion   Intravenous Continuous Forest Gleason, MD 10 mL/hr at 11/04/15 1415    . sodium chloride 0.9 % injection 10 mL  10 mL Intracatheter PRN Forest Gleason, MD   10 mL at 05/06/15 1028  . sodium chloride  0.9 % injection 10 mL  10 mL Intravenous PRN Forest Gleason, MD      . sodium chloride 0.9 % injection 10 mL  10 mL Intravenous PRN Forest Gleason, MD        OBJECTIVE:  Filed Vitals:   02/27/16 1351  BP: 152/87  Pulse: 114  Temp: 99.3 F (37.4 C)  Resp: 18     Body mass index is 21.21 kg/(m^2).    ECOG FS:1 - Symptomatic but completely ambulatory  PHYSICAL EXAM: General status: Patient is pale looking and not any acute distress Lymphatic system: Palpable left supraclavicular lymph node.  Is increasing in the size.   Slightly increased size . Abdomen: Mild distention generalized tenderness no palpable masses no ascites Lungs: Air entry equal on both sides.  No crepitation or rhonchi or rales Palpable mass. Cardiac: Tachycardia Lower extremity trace swelling Skin: No rash Neurological system no focal sign  LAB RESULTS:  Office Visit on 02/27/2016  Component Date Value Ref Range Status  . Color, Urine 02/27/2016 YELLOW* YELLOW Final  . APPearance 02/27/2016 CLEAR* CLEAR Final  . Glucose, UA 02/27/2016 NEGATIVE  NEGATIVE mg/dL Final  . Bilirubin Urine 02/27/2016 NEGATIVE  NEGATIVE Final  . Ketones, ur 02/27/2016 NEGATIVE  NEGATIVE mg/dL Final  . Specific Gravity, Urine 02/27/2016 1.018  1.005 - 1.030 Final  . Hgb urine dipstick 02/27/2016 NEGATIVE  NEGATIVE Final  . pH 02/27/2016 5.0  5.0 - 8.0 Final  . Protein, ur 02/27/2016 NEGATIVE  NEGATIVE mg/dL Final  . Nitrite 02/27/2016 NEGATIVE  NEGATIVE Final  . Leukocytes, UA 02/27/2016 TRACE* NEGATIVE Final  . RBC / HPF 02/27/2016 0-5  0 - 5 RBC/hpf Final  . WBC, UA 02/27/2016 6-30  0 - 5 WBC/hpf Final  . Bacteria, UA 02/27/2016 NONE SEEN  NONE SEEN Final  . Squamous Epithelial / LPF 02/27/2016 0-5* NONE SEEN Final  . Mucous 02/27/2016 PRESENT   Final  . Hyaline Casts, UA 02/27/2016 PRESENT   Final  Appointment on 02/27/2016  Component Date Value Ref Range Status  . WBC 02/27/2016 11.0  3.6 - 11.0 K/uL Final  . RBC 02/27/2016  2.98* 3.80 - 5.20 MIL/uL Final  . Hemoglobin 02/27/2016  8.8* 12.0 - 16.0 g/dL Final  . HCT 02/27/2016 26.6* 35.0 - 47.0 % Final  . MCV 02/27/2016 89.2  80.0 - 100.0 fL Final  . MCH 02/27/2016 29.5  26.0 - 34.0 pg Final  . MCHC 02/27/2016 33.1  32.0 - 36.0 g/dL Final  . RDW 02/27/2016 18.7* 11.5 - 14.5 % Final  . Platelets 02/27/2016 332  150 - 440 K/uL Final  . Neutrophils Relative % 02/27/2016 95   Final  . Neutro Abs 02/27/2016 10.5* 1.4 - 6.5 K/uL Final  . Lymphocytes Relative 02/27/2016 3   Final  . Lymphs Abs 02/27/2016 0.3* 1.0 - 3.6 K/uL Final  . Monocytes Relative 02/27/2016 2   Final  . Monocytes Absolute 02/27/2016 0.2  0.2 - 0.9 K/uL Final  . Eosinophils Relative 02/27/2016 0   Final  . Eosinophils Absolute 02/27/2016 0.0  0 - 0.7 K/uL Final  . Basophils Relative 02/27/2016 0   Final  . Basophils Absolute 02/27/2016 0.0  0 - 0.1 K/uL Final  . Sodium 02/27/2016 130* 135 - 145 mmol/L Final  . Potassium 02/27/2016 4.7  3.5 - 5.1 mmol/L Final  . Chloride 02/27/2016 101  101 - 111 mmol/L Final  . CO2 02/27/2016 19* 22 - 32 mmol/L Final  . Glucose, Bld 02/27/2016 188* 65 - 99 mg/dL Final  . BUN 02/27/2016 39* 6 - 20 mg/dL Final  . Creatinine, Ser 02/27/2016 1.36* 0.44 - 1.00 mg/dL Final  . Calcium 02/27/2016 8.6* 8.9 - 10.3 mg/dL Final  . GFR calc non Af Amer 02/27/2016 39* >60 mL/min Final  . GFR calc Af Amer 02/27/2016 46* >60 mL/min Final   Comment: (NOTE) The eGFR has been calculated using the CKD EPI equation. This calculation has not been validated in all clinical situations. eGFR's persistently <60 mL/min signify possible Chronic Kidney Disease.   . Anion gap 02/27/2016 10  5 - 15 Final    Lab Results  Component Value Date   CA125 18.8 07/23/2014    ASSESSMENT: Carcinoma of endometrium  Stage IV disease.Progressing disease patient is off all chemotherapy on supportive therapy with Megace and IV fluid will continue as quality of life has significantly  improved.  Lab data has been reviewed.  Continue observation and intermittent IV fluids. Because of flank pain urinalysis was done to rule out any tract infection Tentatively antibiotics were started pending culture . reEvaluation in 4 weeks Progressive disease (stage IV endometrial cancer) patient has failed multiple .  BeCause of increasing abdominal pain  is considerably most likely secondary to progressing tumor Fentanyl patch has been increased to 50 g  Cancer of uterus   Staging form: Corpus Uteri - Carcinoma, AJCC 7th Edition     Clinical: Stage IVB (T3b, N2, M1) - Signed by Forest Gleason, MD on 04/18/2015   Forest Gleason, MD   02/28/2016 8:19 AMy.

## 2016-03-08 ENCOUNTER — Telehealth: Payer: Self-pay | Admitting: *Deleted

## 2016-03-08 ENCOUNTER — Other Ambulatory Visit: Payer: Self-pay | Admitting: Oncology

## 2016-03-08 DIAGNOSIS — C541 Malignant neoplasm of endometrium: Secondary | ICD-10-CM

## 2016-03-08 NOTE — Telephone Encounter (Signed)
Pt states is having increasing back pain and weakness. Requests to see MD or NP to check labs and get poss IVF. Per Magda Paganini, NP may see pt on 4/4 with labs and for poss IVF. Pt aware and informed that scheduling will call pt with appt time. Pt verbalized understanding.

## 2016-03-09 ENCOUNTER — Inpatient Hospital Stay: Payer: Medicare Other | Attending: Family Medicine

## 2016-03-09 ENCOUNTER — Ambulatory Visit
Admission: RE | Admit: 2016-03-09 | Discharge: 2016-03-09 | Disposition: A | Discharge status: Home / Self Care | Payer: Medicare Other | Source: Ambulatory Visit | Attending: Family Medicine | Admitting: Family Medicine

## 2016-03-09 ENCOUNTER — Telehealth: Payer: Self-pay | Admitting: *Deleted

## 2016-03-09 ENCOUNTER — Inpatient Hospital Stay: Payer: Medicare Other

## 2016-03-09 ENCOUNTER — Inpatient Hospital Stay (HOSPITAL_BASED_OUTPATIENT_CLINIC_OR_DEPARTMENT_OTHER): Payer: Medicare Other | Admitting: Family Medicine

## 2016-03-09 VITALS — BP 128/74 | HR 114 | Temp 98.1°F | Resp 18 | Wt 120.2 lb

## 2016-03-09 DIAGNOSIS — C541 Malignant neoplasm of endometrium: Secondary | ICD-10-CM | POA: Diagnosis present

## 2016-03-09 DIAGNOSIS — R1011 Right upper quadrant pain: Secondary | ICD-10-CM | POA: Insufficient documentation

## 2016-03-09 DIAGNOSIS — C55 Malignant neoplasm of uterus, part unspecified: Secondary | ICD-10-CM | POA: Diagnosis not present

## 2016-03-09 DIAGNOSIS — Z9221 Personal history of antineoplastic chemotherapy: Secondary | ICD-10-CM | POA: Insufficient documentation

## 2016-03-09 DIAGNOSIS — Z79899 Other long term (current) drug therapy: Secondary | ICD-10-CM

## 2016-03-09 DIAGNOSIS — R590 Localized enlarged lymph nodes: Secondary | ICD-10-CM | POA: Diagnosis not present

## 2016-03-09 DIAGNOSIS — M25551 Pain in right hip: Secondary | ICD-10-CM

## 2016-03-09 DIAGNOSIS — M899 Disorder of bone, unspecified: Secondary | ICD-10-CM | POA: Diagnosis not present

## 2016-03-09 DIAGNOSIS — K5900 Constipation, unspecified: Secondary | ICD-10-CM | POA: Insufficient documentation

## 2016-03-09 DIAGNOSIS — M545 Low back pain: Secondary | ICD-10-CM | POA: Diagnosis not present

## 2016-03-09 DIAGNOSIS — C772 Secondary and unspecified malignant neoplasm of intra-abdominal lymph nodes: Secondary | ICD-10-CM

## 2016-03-09 DIAGNOSIS — C801 Malignant (primary) neoplasm, unspecified: Secondary | ICD-10-CM

## 2016-03-09 LAB — COMPREHENSIVE METABOLIC PANEL
ALBUMIN: 2.8 g/dL — AB (ref 3.5–5.0)
ALT: 24 U/L (ref 14–54)
ANION GAP: 8 (ref 5–15)
AST: 13 U/L — ABNORMAL LOW (ref 15–41)
Alkaline Phosphatase: 97 U/L (ref 38–126)
BILIRUBIN TOTAL: 0.4 mg/dL (ref 0.3–1.2)
BUN: 38 mg/dL — ABNORMAL HIGH (ref 6–20)
CO2: 22 mmol/L (ref 22–32)
Calcium: 8.7 mg/dL — ABNORMAL LOW (ref 8.9–10.3)
Chloride: 102 mmol/L (ref 101–111)
Creatinine, Ser: 1.29 mg/dL — ABNORMAL HIGH (ref 0.44–1.00)
GFR, EST AFRICAN AMERICAN: 49 mL/min — AB (ref >60)
GFR, EST NON AFRICAN AMERICAN: 42 mL/min — AB (ref >60)
GLUCOSE: 116 mg/dL — AB (ref 65–99)
POTASSIUM: 4.1 mmol/L (ref 3.5–5.1)
Sodium: 132 mmol/L — ABNORMAL LOW (ref 135–145)
TOTAL PROTEIN: 7 g/dL (ref 6.5–8.1)

## 2016-03-09 LAB — CBC WITH DIFFERENTIAL/PLATELET
BASOS PCT: 0 %
Basophils Absolute: 0 10*3/uL (ref 0–0.1)
EOS ABS: 0 10*3/uL (ref 0–0.7)
Eosinophils Relative: 0 %
HEMATOCRIT: 25.8 % — AB (ref 35.0–47.0)
Hemoglobin: 8.4 g/dL — ABNORMAL LOW (ref 12.0–16.0)
Lymphocytes Relative: 9 %
Lymphs Abs: 0.8 10*3/uL — ABNORMAL LOW (ref 1.0–3.6)
MCH: 29.3 pg (ref 26.0–34.0)
MCHC: 32.5 g/dL (ref 32.0–36.0)
MCV: 90 fL (ref 80.0–100.0)
MONO ABS: 0.4 10*3/uL (ref 0.2–0.9)
MONOS PCT: 4 %
NEUTROS ABS: 8.3 10*3/uL — AB (ref 1.4–6.5)
Neutrophils Relative %: 87 %
Platelets: 327 10*3/uL (ref 150–440)
RBC: 2.87 MIL/uL — ABNORMAL LOW (ref 3.80–5.20)
RDW: 18.4 % — AB (ref 11.5–14.5)
WBC: 9.6 10*3/uL (ref 3.6–11.0)

## 2016-03-09 LAB — MAGNESIUM: MAGNESIUM: 2 mg/dL (ref 1.7–2.4)

## 2016-03-09 MED ORDER — OXYCODONE HCL 5 MG PO TABS: 5.0000 mg | ORAL_TABLET | ORAL | Status: DC | PRN

## 2016-03-09 MED ORDER — HEPARIN SOD (PORK) LOCK FLUSH 100 UNIT/ML IV SOLN
500.0000 [IU] | Freq: Once | INTRAVENOUS | Status: AC
  Administered 2016-03-09: 500 [IU] via INTRAVENOUS
  Filled 2016-03-09: qty 5

## 2016-03-09 MED ORDER — DEXAMETHASONE SODIUM PHOSPHATE 10 MG/ML IJ SOLN: 10.0000 mg | Freq: Once | INTRAMUSCULAR | Status: DC

## 2016-03-09 MED ORDER — KETOROLAC TROMETHAMINE 30 MG/ML IJ SOLN
15.0000 mg | Freq: Once | INTRAMUSCULAR | Status: AC
  Administered 2016-03-09: 15 mg via INTRAVENOUS
  Filled 2016-03-09: qty 1

## 2016-03-09 MED ORDER — SODIUM CHLORIDE 0.9 % IV SOLN
10.0000 mg | Freq: Once | INTRAVENOUS | Status: AC
  Administered 2016-03-09: 10 mg via INTRAVENOUS
  Filled 2016-03-09: qty 1

## 2016-03-09 MED ORDER — IOPAMIDOL (ISOVUE-300) INJECTION 61%
85.0000 mL | Freq: Once | INTRAVENOUS | Status: AC | PRN
  Administered 2016-03-09: 85 mL via INTRAVENOUS

## 2016-03-09 MED ORDER — SODIUM CHLORIDE 0.9 % IV SOLN
Freq: Once | INTRAVENOUS | Status: AC
  Administered 2016-03-09: 10:00:00 via INTRAVENOUS
  Filled 2016-03-09: qty 1000

## 2016-03-09 NOTE — Progress Notes (Signed)
[place]  [phone]  [phone]     [name] Graham DOB: 08-17-1951  MR#: [id number]  OEH#:[id number]  [name], MD as PCP - General (Family Medicine) [name], MD (General Surgery)  CHIEF COMPLAINT:  Chief Complaint  Patient presents with  . Back Pain    INTERVAL HISTORY:  Patient is here as an acute add on regarding abdominal pain in the right upper quadrant that radiates around to the back, fatigue, and poor oral intake. She reports having a bowel movement yesterday. She denies any nausea but has not been able to eat or drink very well. She reports pain as an 8 of 10. She denies any fever, vomiting, chest pain, or shortness of breath.  REVIEW OF SYSTEMS:   Review of Systems  Constitutional: Positive for malaise/fatigue. Negative for fever, chills, weight loss and diaphoresis.  HENT: Negative.   Eyes: Negative.   Respiratory: Negative for cough, hemoptysis, sputum production, shortness of breath and wheezing.   Cardiovascular: Negative for chest pain, palpitations, orthopnea, claudication, leg swelling and PND.  Gastrointestinal: Positive for abdominal pain. Negative for heartburn, nausea, vomiting, diarrhea, constipation, blood in stool and melena.  Genitourinary: Negative.   Musculoskeletal: Negative.   Skin: Negative.   Neurological: Negative for dizziness, tingling, focal weakness, seizures and weakness.  Endo/Heme/Allergies: Does not bruise/bleed easily.  Psychiatric/Behavioral: Negative for depression. The patient is not nervous/anxious and does not have insomnia.     As per HPI. Otherwise, a complete review of systems is negatve.  ONCOLOGY HISTORY: Oncology History   Chief Complaint/Diagnosis:   The patient is a 65 year old with metastatic endometrial cancer who is seen for assessment during ongoing palliative radiation therapy.  04/2011   papillary serous adenocarcinoma of the  endometrium, stage IIIb (nodal metastasis)              TAHBSO and full staging,               GOG protocl using XRT and chemotherapy, significant XRT side-effects,               continued on chemotherapy off protocol, 04/2013   recurrence in the nodal areas,              DDP and docetaxel x 6 completed in December,   started on chemotherapy with Adriamycin(October, 2015) poor tolerance to letrozole  and affinator Estrogen and progesterone receptor negative tumor         hER-2/neu recetor negative. November 07, 2014 progressive disease by  tumor marker criteria and clinical examination November 12, 2014 After 6 cycles of chemotherapy because of increasing side effect chemotherapy was discontinued.  (March, 2016) Patient was started on cis-platinum and topotecan. May, 2016 Cis-platinum into [facility] and was discontinued because of significant side effect and possibility of progressive disease.  Patient would be started on NIVOLULAMAB on compassionate use basis  Nivolumab was discontinued in December 2016 due to progressing disease.     Cancer of uterus ([place])   04/18/2015 Initial Diagnosis Cancer of uterus    PAST MEDICAL HISTORY: Past Medical History  Diagnosis Date  . Cancer ([place]) 2012  . GI problem 2013  . Cancer of uterus ([place]) 04/18/2015  . Uterine cancer ([place])   . Wears hearing aid     bilateral    PAST SURGICAL HISTORY: Past Surgical History  Procedure Laterality Date  . Abdominal hysterectomy    . Tonsillectomy    . Cataract extraction w/phaco  Left 02/11/2016    Procedure: CATARACT EXTRACTION PHACO AND INTRAOCULAR LENS PLACEMENT (IOC);  Surgeon: Leandrew Koyanagi, MD;  Location: [facility];  Service: Ophthalmology;  Laterality: Left;    FAMILY HISTORY Family History  Problem Relation Age of Onset  . Hypertension Other     GYNECOLOGIC HISTORY:  No LMP recorded. Patient has had a hysterectomy.     ADVANCED DIRECTIVES:    HEALTH MAINTENANCE: Social  History  Substance Use Topics  . Smoking status: Never Smoker   . Smokeless tobacco: Never Used  . Alcohol Use: 0.0 oz/week    0 Standard drinks or equivalent per week     Colonoscopy:  PAP:  Bone density:  Mammogram:  Allergies  Allergen Reactions  . Penicillins Rash         Current Outpatient Prescriptions  Medication Sig Dispense Refill  . acetaminophen (TYLENOL) 500 MG tablet Take 500 mg by mouth every 6 (six) hours as needed.    . ALPRAZolam (XANAX) 0.5 MG tablet Take 1 tablet (0.5 mg total) by mouth 2 (two) times daily. 60 tablet 3  . amitriptyline (ELAVIL) 25 MG tablet Take 1 tablet (25 mg total) by mouth at bedtime. 30 tablet 3  . Ca Carbonate-Mag Hydroxide (ROLAIDS) 550-110 MG CHEW Chew 2 tablets by mouth as needed (for indigestion).    Marland Kitchen docusate sodium (COLACE) 100 MG capsule Take 100 mg by mouth 2 (two) times daily as needed for mild constipation.    . fentaNYL (DURAGESIC - DOSED MCG/HR) 12 MCG/HR Place 1 patch (12.5 mcg total) onto the skin every 3 (three) days. Use with 54mg patch for total dose of 37.526m. 10 patch 0  . fentaNYL (DURAGESIC - DOSED MCG/HR) 25 MCG/HR patch Place 1 patch (25 mcg total) onto the skin every 3 (three) days. Use with 12.36m56mpatch for total dose 37.36mc636m10 patch 0  . fluticasone (FLONASE) 50 MCG/ACT nasal spray Place 1-2 sprays into the nose daily as needed for allergies or rhinitis.     . HYMarland KitchenROcodone-acetaminophen (NORCO/VICODIN) 5-325 MG tablet Take 1 tablet by mouth every 6 (six) hours as needed for moderate pain. 60 tablet 0  . lactulose (CHRONULAC) 10 GM/15ML solution Take 45 mLs (30 g total) by mouth daily as needed for severe constipation. 240 mL 0  . meclizine (ANTIVERT) 25 MG tablet Take 25 mg by mouth 4 (four) times daily as needed for dizziness.    . megestrol (MEGACE) 400 MG/10ML suspension Take 10 mLs (400 mg total) by mouth 2 (two) times daily. 480 mL 3  . metoCLOPramide (REGLAN) 10 MG tablet Take 1 tablet (10 mg total) by  mouth 4 (four) times daily as needed for nausea. 60 tablet 3  . mirtazapine (REMERON) 15 MG tablet Take 15 mg by mouth at bedtime.   3  . omeprazole (PRILOSEC) 20 MG capsule Take 1 capsule (20 mg total) by mouth daily. 30 capsule 6  . ondansetron (ZOFRAN) 4 MG tablet Take 1 tablet (4 mg total) by mouth every 6 (six) hours as needed for nausea or vomiting. 60 tablet 3  . polyethylene glycol (MIRALAX / GLYCOLAX) packet Take 17 g by mouth daily. (Patient taking differently: Take 17 g by mouth daily as needed. ) 30 each 0  . predniSONE (DELTASONE) 20 MG tablet Take 1 tablet (20 mg total) by mouth daily with breakfast. 30 tablet 3  . sertraline (ZOLOFT) 50 MG tablet TAKE 1 TABLET BY MOUTH EVERY MORNING 30 tablet 0  . oxyCODONE (OXY IR/ROXICODONE) 5  MG immediate release tablet Take 1 tablet (5 mg total) by mouth every 4 (four) hours as needed for severe pain. 30 tablet 0  . potassium chloride (K-DUR) 10 MEQ tablet TAKE 1 TABLET(10 MEQ) BY MOUTH DAILY 90 tablet 3   Current Facility-Administered Medications  Medication Dose Route Frequency Provider Last Rate Last Dose  . 0.9 %  sodium chloride infusion   Intravenous Once Evlyn Kanner, NP      . dexamethasone (DECADRON) injection 10 mg  10 mg Intravenous Once Evlyn Kanner, NP      . ketorolac (TORADOL) 30 MG/ML injection 15 mg  15 mg Intravenous Once Evlyn Kanner, NP       Facility-Administered Medications Ordered in Other Visits  Medication Dose Route Frequency Provider Last Rate Last Dose  . 0.9 %  sodium chloride infusion   Intravenous Continuous Forest Gleason, MD 10 mL/hr at 11/04/15 1415    . sodium chloride 0.9 % injection 10 mL  10 mL Intracatheter PRN Forest Gleason, MD   10 mL at 05/06/15 1028  . sodium chloride 0.9 % injection 10 mL  10 mL Intravenous PRN Forest Gleason, MD      . sodium chloride 0.9 % injection 10 mL  10 mL Intravenous PRN Forest Gleason, MD        OBJECTIVE: BP 128/74 mmHg  Pulse 114  Temp(Src) 98.1 F (36.7 C)  (Tympanic)  Resp 18  Wt 120 lb 2.4 oz (54.5 kg)   Body mass index is 21.29 kg/(m^2).    ECOG FS:1 - Symptomatic but completely ambulatory  General: Well-developed, well-nourished, no acute distress. Eyes: Pink conjunctiva, anicteric sclera. HEENT: Normocephalic, moist mucous membranes, clear oropharnyx. Lungs: Clear to auscultation bilaterally. Heart: Regular rate and rhythm. No rubs, murmurs, or gallops. Abdomen: Soft, nontender. No organomegaly noted, normoactive bowel sounds. Musculoskeletal: No edema, cyanosis, or clubbing. Neuro: Alert, answering all questions appropriately. Cranial nerves grossly intact. Skin: No rashes or petechiae noted. Psych: Normal affect.   LAB RESULTS:  Appointment on 03/09/2016  Component Date Value Ref Range Status  . WBC 03/09/2016 9.6  3.6 - 11.0 K/uL Final  . RBC 03/09/2016 2.87* 3.80 - 5.20 MIL/uL Final  . Hemoglobin 03/09/2016 8.4* 12.0 - 16.0 g/dL Final  . HCT 03/09/2016 25.8* 35.0 - 47.0 % Final  . MCV 03/09/2016 90.0  80.0 - 100.0 fL Final  . MCH 03/09/2016 29.3  26.0 - 34.0 pg Final  . MCHC 03/09/2016 32.5  32.0 - 36.0 g/dL Final  . RDW 03/09/2016 18.4* 11.5 - 14.5 % Final  . Platelets 03/09/2016 327  150 - 440 K/uL Final  . Neutrophils Relative % 03/09/2016 87   Final  . Neutro Abs 03/09/2016 8.3* 1.4 - 6.5 K/uL Final  . Lymphocytes Relative 03/09/2016 9   Final  . Lymphs Abs 03/09/2016 0.8* 1.0 - 3.6 K/uL Final  . Monocytes Relative 03/09/2016 4   Final  . Monocytes Absolute 03/09/2016 0.4  0.2 - 0.9 K/uL Final  . Eosinophils Relative 03/09/2016 0   Final  . Eosinophils Absolute 03/09/2016 0.0  0 - 0.7 K/uL Final  . Basophils Relative 03/09/2016 0   Final  . Basophils Absolute 03/09/2016 0.0  0 - 0.1 K/uL Final  . Sodium 03/09/2016 132* 135 - 145 mmol/L Final  . Potassium 03/09/2016 4.1  3.5 - 5.1 mmol/L Final  . Chloride 03/09/2016 102  101 - 111 mmol/L Final  . CO2 03/09/2016 22  22 - 32 mmol/L Final  . Glucose,  Bld 03/09/2016  116* 65 - 99 mg/dL Final  . BUN 03/09/2016 38* 6 - 20 mg/dL Final  . Creatinine, Ser 03/09/2016 1.29* 0.44 - 1.00 mg/dL Final  . Calcium 03/09/2016 8.7* 8.9 - 10.3 mg/dL Final  . Total Protein 03/09/2016 7.0  6.5 - 8.1 g/dL Final  . Albumin 03/09/2016 2.8* 3.5 - 5.0 g/dL Final  . AST 03/09/2016 13* 15 - 41 U/L Final  . ALT 03/09/2016 24  14 - 54 U/L Final  . Alkaline Phosphatase 03/09/2016 97  38 - 126 U/L Final  . Total Bilirubin 03/09/2016 0.4  0.3 - 1.2 mg/dL Final  . GFR calc non Af Amer 03/09/2016 42* >60 mL/min Final  . GFR calc Af Amer 03/09/2016 49* >60 mL/min Final   Comment: (NOTE) The eGFR has been calculated using the CKD EPI equation. This calculation has not been validated in all clinical situations. eGFR's persistently <60 mL/min signify possible Chronic Kidney Disease.   . Anion gap 03/09/2016 8  5 - 15 Final  . Magnesium 03/09/2016 2.0  1.7 - 2.4 mg/dL Final    STUDIES: No results found.  ASSESSMENT:  Right upper quadrant abdominal pain.  PLAN:  1. RUQ abdominal pain. Patient was sent for CT scan: CT scan showed mild increase in the abdominal retroperitoneal lymphadenopathy consistent with progressive metastatic disease. Also noted to have stable mixed lytic and sclerotic bone lesions in the anterior L2 vertebral body, bony metastasis could not be excluded. Patient also with diffuse colonic distention with large stool burden. Right upper quadrant abdominal pain likely related to constipation. However increasing retroperitoneal lymphadenopathy cannot be excluded for the cause. Advised patient to continue with use of lactulose to try and clear stool to see if improvement in pain. Lower back pain could be related to sclerotic lesions related to L2 vertebral body. We'll discuss these findings with oncologist upon return. She has follow-up scheduled for 03/25/2016.  Patient expressed understanding and was in agreement with this plan. She also understands that She can  call clinic at any time with any questions, concerns, or complaints.   Dr. Grayland Ormond was available for consultation and review of plan of care for this patient.  Cancer of uterus [facility])   Staging form: Corpus Uteri - Carcinoma, AJCC 7th Edition     Clinical: Stage IVB (T3b, N2, M1) - Signed by Forest Gleason, MD on 04/18/2015   Evlyn Kanner, NP   03/09/2016 9:30 AM

## 2016-03-09 NOTE — Telephone Encounter (Signed)
Per [name], NP, pt is severely constipated per CT scan. Pt needs to take lactulose q2 hours until has bowel movement with miralax daily and stool softeners BID. Encouraged pt to take stool softeners daily to help prevent future episodes of constipation. Pt verbalized understanding. Instructed pt to callback in the next few days if unable to produce bowel movement or if pain continues to worsen.

## 2016-03-11 ENCOUNTER — Ambulatory Visit
Admission: RE | Admit: 2016-03-11 | Discharge: 2016-03-11 | Disposition: A | Discharge status: Home / Self Care | Payer: Medicare Other | Source: Ambulatory Visit | Attending: Oncology | Admitting: Oncology

## 2016-03-11 ENCOUNTER — Other Ambulatory Visit: Payer: Self-pay | Admitting: *Deleted

## 2016-03-11 DIAGNOSIS — C55 Malignant neoplasm of uterus, part unspecified: Secondary | ICD-10-CM

## 2016-03-11 DIAGNOSIS — K5900 Constipation, unspecified: Secondary | ICD-10-CM | POA: Insufficient documentation

## 2016-03-11 DIAGNOSIS — R109 Unspecified abdominal pain: Secondary | ICD-10-CM | POA: Insufficient documentation

## 2016-03-12 ENCOUNTER — Inpatient Hospital Stay (HOSPITAL_BASED_OUTPATIENT_CLINIC_OR_DEPARTMENT_OTHER): Payer: Medicare Other | Admitting: Oncology

## 2016-03-12 ENCOUNTER — Inpatient Hospital Stay: Payer: Medicare Other

## 2016-03-12 VITALS — BP 125/81 | HR 108 | Temp 97.2°F | Resp 16 | Wt 119.7 lb

## 2016-03-12 DIAGNOSIS — Z9221 Personal history of antineoplastic chemotherapy: Secondary | ICD-10-CM

## 2016-03-12 DIAGNOSIS — C541 Malignant neoplasm of endometrium: Secondary | ICD-10-CM | POA: Diagnosis not present

## 2016-03-12 DIAGNOSIS — M545 Low back pain: Secondary | ICD-10-CM

## 2016-03-12 DIAGNOSIS — E860 Dehydration: Secondary | ICD-10-CM

## 2016-03-12 DIAGNOSIS — K5900 Constipation, unspecified: Secondary | ICD-10-CM

## 2016-03-12 DIAGNOSIS — Z79899 Other long term (current) drug therapy: Secondary | ICD-10-CM

## 2016-03-12 DIAGNOSIS — C772 Secondary and unspecified malignant neoplasm of intra-abdominal lymph nodes: Secondary | ICD-10-CM | POA: Diagnosis not present

## 2016-03-12 DIAGNOSIS — M899 Disorder of bone, unspecified: Secondary | ICD-10-CM

## 2016-03-12 DIAGNOSIS — R1011 Right upper quadrant pain: Secondary | ICD-10-CM | POA: Diagnosis not present

## 2016-03-12 DIAGNOSIS — C548 Malignant neoplasm of overlapping sites of corpus uteri: Secondary | ICD-10-CM

## 2016-03-12 MED ORDER — HEPARIN SOD (PORK) LOCK FLUSH 100 UNIT/ML IV SOLN
500.0000 [IU] | Freq: Once | INTRAVENOUS | Status: AC
  Administered 2016-03-12: 500 [IU] via INTRAVENOUS

## 2016-03-12 MED ORDER — HEPARIN SOD (PORK) LOCK FLUSH 100 UNIT/ML IV SOLN
INTRAVENOUS | Status: AC
  Filled 2016-03-12: qty 5

## 2016-03-12 MED ORDER — SODIUM CHLORIDE 0.9 % IV SOLN
Freq: Once | INTRAVENOUS | Status: AC
  Administered 2016-03-12: 12:00:00 via INTRAVENOUS
  Filled 2016-03-12: qty 1000

## 2016-03-12 MED ORDER — SODIUM CHLORIDE 0.9% FLUSH
10.0000 mL | INTRAVENOUS | Status: DC | PRN
  Administered 2016-03-12: 10 mL via INTRAVENOUS
  Filled 2016-03-12: qty 10

## 2016-03-12 MED ORDER — SODIUM CHLORIDE 0.9 % IV SOLN
Freq: Once | INTRAVENOUS | Status: DC
Start: 2016-03-12 — End: 2016-03-22
  Filled 2016-03-12: qty 1000

## 2016-03-12 NOTE — Progress Notes (Signed)
Patient is having abdominal pain and saw NP at the [facility] then had a CT that did show constipation.  [name] Graham prescribed Lactulose but when she took the medication she went from constipation to diarrhea within 12 hours and has not taken any more since.  She is currently taking stool softener 2 bid with Miralax QHS.  Is having abdominal pain that radiates to the back that is 8/10 on pain scale.

## 2016-03-15 ENCOUNTER — Inpatient Hospital Stay
Admission: EM | Admit: 2016-03-15 | Discharge: 2016-03-18 | DRG: 389 | Disposition: A | Discharge status: Home / Self Care | Payer: Medicare Other | Attending: Internal Medicine | Admitting: Internal Medicine

## 2016-03-15 ENCOUNTER — Telehealth: Payer: Self-pay

## 2016-03-15 ENCOUNTER — Emergency Department: Payer: Medicare Other

## 2016-03-15 DIAGNOSIS — K219 Gastro-esophageal reflux disease without esophagitis: Secondary | ICD-10-CM | POA: Diagnosis present

## 2016-03-15 DIAGNOSIS — K56600 Partial intestinal obstruction, unspecified as to cause: Secondary | ICD-10-CM | POA: Diagnosis present

## 2016-03-15 DIAGNOSIS — C541 Malignant neoplasm of endometrium: Secondary | ICD-10-CM | POA: Diagnosis present

## 2016-03-15 DIAGNOSIS — Z7951 Long term (current) use of inhaled steroids: Secondary | ICD-10-CM | POA: Diagnosis not present

## 2016-03-15 DIAGNOSIS — Z7952 Long term (current) use of systemic steroids: Secondary | ICD-10-CM

## 2016-03-15 DIAGNOSIS — E871 Hypo-osmolality and hyponatremia: Secondary | ICD-10-CM | POA: Diagnosis present

## 2016-03-15 DIAGNOSIS — R1031 Right lower quadrant pain: Secondary | ICD-10-CM | POA: Diagnosis not present

## 2016-03-15 DIAGNOSIS — E876 Hypokalemia: Secondary | ICD-10-CM | POA: Diagnosis present

## 2016-03-15 DIAGNOSIS — Z79891 Long term (current) use of opiate analgesic: Secondary | ICD-10-CM | POA: Diagnosis not present

## 2016-03-15 DIAGNOSIS — Z79899 Other long term (current) drug therapy: Secondary | ICD-10-CM

## 2016-03-15 DIAGNOSIS — Z800 Family history of malignant neoplasm of digestive organs: Secondary | ICD-10-CM | POA: Diagnosis not present

## 2016-03-15 DIAGNOSIS — Y842 Radiological procedure and radiotherapy as the cause of abnormal reaction of the patient, or of later complication, without mention of misadventure at the time of the procedure: Secondary | ICD-10-CM | POA: Diagnosis present

## 2016-03-15 DIAGNOSIS — Z8249 Family history of ischemic heart disease and other diseases of the circulatory system: Secondary | ICD-10-CM

## 2016-03-15 DIAGNOSIS — C55 Malignant neoplasm of uterus, part unspecified: Secondary | ICD-10-CM | POA: Diagnosis present

## 2016-03-15 DIAGNOSIS — R590 Localized enlarged lymph nodes: Secondary | ICD-10-CM | POA: Diagnosis present

## 2016-03-15 DIAGNOSIS — K567 Ileus, unspecified: Secondary | ICD-10-CM | POA: Diagnosis not present

## 2016-03-15 DIAGNOSIS — Z8542 Personal history of malignant neoplasm of other parts of uterus: Secondary | ICD-10-CM | POA: Diagnosis not present

## 2016-03-15 DIAGNOSIS — C7951 Secondary malignant neoplasm of bone: Secondary | ICD-10-CM | POA: Diagnosis present

## 2016-03-15 DIAGNOSIS — F329 Major depressive disorder, single episode, unspecified: Secondary | ICD-10-CM | POA: Diagnosis present

## 2016-03-15 DIAGNOSIS — D638 Anemia in other chronic diseases classified elsewhere: Secondary | ICD-10-CM | POA: Diagnosis present

## 2016-03-15 DIAGNOSIS — Z880 Allergy status to penicillin: Secondary | ICD-10-CM

## 2016-03-15 DIAGNOSIS — R112 Nausea with vomiting, unspecified: Secondary | ICD-10-CM | POA: Diagnosis present

## 2016-03-15 DIAGNOSIS — K5669 Other intestinal obstruction: Secondary | ICD-10-CM | POA: Diagnosis not present

## 2016-03-15 LAB — URINALYSIS COMPLETE WITH MICROSCOPIC (ARMC ONLY)
BILIRUBIN URINE: NEGATIVE
GLUCOSE, UA: NEGATIVE mg/dL
HGB URINE DIPSTICK: NEGATIVE
KETONES UR: NEGATIVE mg/dL
LEUKOCYTES UA: NEGATIVE
NITRITE: NEGATIVE
PH: 5 (ref 5.0–8.0)
Protein, ur: NEGATIVE mg/dL
SQUAMOUS EPITHELIAL / LPF: NONE SEEN
Specific Gravity, Urine: 1.013 (ref 1.005–1.030)

## 2016-03-15 LAB — COMPREHENSIVE METABOLIC PANEL
ALT: 18 U/L (ref 14–54)
AST: 16 U/L (ref 15–41)
Albumin: 2.7 g/dL — ABNORMAL LOW (ref 3.5–5.0)
Alkaline Phosphatase: 97 U/L (ref 38–126)
Anion gap: 10 (ref 5–15)
BUN: 31 mg/dL — ABNORMAL HIGH (ref 6–20)
CALCIUM: 9 mg/dL (ref 8.9–10.3)
CHLORIDE: 102 mmol/L (ref 101–111)
CO2: 19 mmol/L — ABNORMAL LOW (ref 22–32)
CREATININE: 1.42 mg/dL — AB (ref 0.44–1.00)
GFR calc non Af Amer: 37 mL/min — ABNORMAL LOW (ref >60)
GFR, EST AFRICAN AMERICAN: 43 mL/min — AB (ref >60)
Glucose, Bld: 149 mg/dL — ABNORMAL HIGH (ref 65–99)
Potassium: 4.2 mmol/L (ref 3.5–5.1)
SODIUM: 131 mmol/L — AB (ref 135–145)
TOTAL PROTEIN: 7.3 g/dL (ref 6.5–8.1)
Total Bilirubin: 0.7 mg/dL (ref 0.3–1.2)

## 2016-03-15 LAB — CBC
HCT: 25.4 % — ABNORMAL LOW (ref 35.0–47.0)
Hemoglobin: 8.2 g/dL — ABNORMAL LOW (ref 12.0–16.0)
MCH: 29.3 pg (ref 26.0–34.0)
MCHC: 32.5 g/dL (ref 32.0–36.0)
MCV: 90.1 fL (ref 80.0–100.0)
PLATELETS: 379 10*3/uL (ref 150–440)
RBC: 2.82 MIL/uL — AB (ref 3.80–5.20)
RDW: 17 % — AB (ref 11.5–14.5)
WBC: 11.2 10*3/uL — AB (ref 3.6–11.0)

## 2016-03-15 LAB — LIPASE, BLOOD: LIPASE: 15 U/L (ref 11–51)

## 2016-03-15 MED ORDER — ACETAMINOPHEN 500 MG PO TABS
1000.0000 mg | ORAL_TABLET | Freq: Four times a day (QID) | ORAL | Status: DC | PRN
  Administered 2016-03-16 – 2016-03-18 (×4): 1000 mg via ORAL
  Filled 2016-03-15 (×4): qty 2

## 2016-03-15 MED ORDER — IOPAMIDOL (ISOVUE-300) INJECTION 61%
75.0000 mL | Freq: Once | INTRAVENOUS | Status: AC | PRN
Start: 2016-03-15 — End: 2016-03-15
  Administered 2016-03-15: 75 mL via INTRAVENOUS

## 2016-03-15 MED ORDER — SODIUM CHLORIDE 0.9 % IV SOLN
INTRAVENOUS | Status: DC
  Administered 2016-03-15 – 2016-03-17 (×3): via INTRAVENOUS

## 2016-03-15 MED ORDER — DIATRIZOATE MEGLUMINE & SODIUM 66-10 % PO SOLN
15.0000 mL | ORAL | Status: AC
Start: 2016-03-15 — End: 2016-03-15
  Administered 2016-03-15 (×2): 15 mL via ORAL
  Filled 2016-03-15 (×2): qty 30

## 2016-03-15 MED ORDER — POTASSIUM CHLORIDE CRYS ER 10 MEQ PO TBCR
10.0000 meq | EXTENDED_RELEASE_TABLET | Freq: Every day | ORAL | Status: DC
  Administered 2016-03-16 – 2016-03-18 (×3): 10 meq via ORAL
  Filled 2016-03-15 (×3): qty 1

## 2016-03-15 MED ORDER — ONDANSETRON HCL 4 MG/2ML IJ SOLN
4.0000 mg | Freq: Four times a day (QID) | INTRAMUSCULAR | Status: DC | PRN
  Administered 2016-03-15 – 2016-03-17 (×5): 4 mg via INTRAVENOUS
  Filled 2016-03-15 (×5): qty 2

## 2016-03-15 MED ORDER — METOCLOPRAMIDE HCL 5 MG/ML IJ SOLN
10.0000 mg | Freq: Once | INTRAMUSCULAR | Status: AC
  Administered 2016-03-15: 10 mg via INTRAVENOUS
  Filled 2016-03-15: qty 2

## 2016-03-15 MED ORDER — MORPHINE SULFATE (PF) 2 MG/ML IV SOLN: 2.0000 mg | Freq: Once | INTRAVENOUS | Status: DC

## 2016-03-15 MED ORDER — ONDANSETRON HCL 4 MG/2ML IJ SOLN
4.0000 mg | Freq: Once | INTRAMUSCULAR | Status: AC
  Administered 2016-03-15: 4 mg via INTRAVENOUS
  Filled 2016-03-15: qty 2

## 2016-03-15 MED ORDER — ONDANSETRON HCL 4 MG PO TABS
4.0000 mg | ORAL_TABLET | Freq: Four times a day (QID) | ORAL | Status: DC | PRN
  Filled 2016-03-15: qty 1

## 2016-03-15 MED ORDER — MEGESTROL ACETATE 400 MG/10ML PO SUSP
400.0000 mg | Freq: Two times a day (BID) | ORAL | Status: DC
  Administered 2016-03-15 – 2016-03-18 (×6): 400 mg via ORAL
  Filled 2016-03-15 (×8): qty 10

## 2016-03-15 MED ORDER — SODIUM CHLORIDE 0.9% FLUSH: 10.0000 mL | INTRAVENOUS | Status: DC | PRN

## 2016-03-15 MED ORDER — SODIUM CHLORIDE 0.9 % IV BOLUS (SEPSIS)
500.0000 mL | Freq: Once | INTRAVENOUS | Status: AC
  Administered 2016-03-15: 500 mL via INTRAVENOUS

## 2016-03-15 MED ORDER — ONDANSETRON HCL 4 MG PO TABS
4.0000 mg | ORAL_TABLET | Freq: Four times a day (QID) | ORAL | Status: DC | PRN
  Administered 2016-03-18: 4 mg via ORAL

## 2016-03-15 MED ORDER — PREDNISONE 20 MG PO TABS
20.0000 mg | ORAL_TABLET | Freq: Every day | ORAL | Status: DC
  Administered 2016-03-16 – 2016-03-18 (×3): 20 mg via ORAL
  Filled 2016-03-15 (×3): qty 1

## 2016-03-15 MED ORDER — MORPHINE SULFATE (PF) 2 MG/ML IV SOLN
INTRAVENOUS | Status: AC
  Administered 2016-03-15: 2 mg via INTRAVENOUS
  Filled 2016-03-15: qty 1

## 2016-03-15 MED ORDER — MIRTAZAPINE 15 MG PO TABS
15.0000 mg | ORAL_TABLET | Freq: Every day | ORAL | Status: DC
  Administered 2016-03-15 – 2016-03-17 (×3): 15 mg via ORAL
  Filled 2016-03-15 (×3): qty 1

## 2016-03-15 MED ORDER — METOCLOPRAMIDE HCL 10 MG PO TABS
10.0000 mg | ORAL_TABLET | Freq: Four times a day (QID) | ORAL | Status: DC | PRN
  Administered 2016-03-17: 10 mg via ORAL
  Filled 2016-03-15: qty 1

## 2016-03-15 MED ORDER — ALPRAZOLAM 0.5 MG PO TABS
0.5000 mg | ORAL_TABLET | Freq: Two times a day (BID) | ORAL | Status: DC
  Administered 2016-03-15 – 2016-03-18 (×6): 0.5 mg via ORAL
  Filled 2016-03-15 (×6): qty 1

## 2016-03-15 MED ORDER — HYDROCODONE-ACETAMINOPHEN 5-325 MG PO TABS
1.0000 | ORAL_TABLET | Freq: Four times a day (QID) | ORAL | Status: DC | PRN
  Administered 2016-03-15 – 2016-03-18 (×6): 1 via ORAL
  Filled 2016-03-15 (×6): qty 1

## 2016-03-15 MED ORDER — MORPHINE SULFATE (PF) 2 MG/ML IV SOLN
2.0000 mg | Freq: Once | INTRAVENOUS | Status: AC
Start: 2016-03-15 — End: 2016-03-15
  Administered 2016-03-15: 2 mg via INTRAVENOUS

## 2016-03-15 MED ORDER — MORPHINE SULFATE (PF) 2 MG/ML IV SOLN
2.0000 mg | Freq: Once | INTRAVENOUS | Status: AC
  Administered 2016-03-15: 2 mg via INTRAVENOUS
  Filled 2016-03-15: qty 1

## 2016-03-15 MED ORDER — LACTULOSE 10 GM/15ML PO SOLN
30.0000 g | Freq: Two times a day (BID) | ORAL | Status: DC
  Administered 2016-03-15 – 2016-03-18 (×6): 30 g via ORAL
  Filled 2016-03-15 (×6): qty 60

## 2016-03-15 MED ORDER — LORATADINE 10 MG PO TABS
10.0000 mg | ORAL_TABLET | Freq: Every day | ORAL | Status: DC
  Administered 2016-03-16 – 2016-03-18 (×3): 10 mg via ORAL
  Filled 2016-03-15 (×3): qty 1

## 2016-03-15 MED ORDER — SERTRALINE HCL 100 MG PO TABS
100.0000 mg | ORAL_TABLET | Freq: Every day | ORAL | Status: DC
  Administered 2016-03-16 – 2016-03-18 (×3): 100 mg via ORAL
  Filled 2016-03-15 (×3): qty 1

## 2016-03-15 MED ORDER — ACETAMINOPHEN 650 MG RE SUPP
650.0000 mg | Freq: Four times a day (QID) | RECTAL | Status: DC | PRN
Start: 2016-03-15 — End: 2016-03-15

## 2016-03-15 MED ORDER — AMITRIPTYLINE HCL 25 MG PO TABS
25.0000 mg | ORAL_TABLET | Freq: Every day | ORAL | Status: DC
  Administered 2016-03-15 – 2016-03-17 (×3): 25 mg via ORAL
  Filled 2016-03-15 (×3): qty 1

## 2016-03-15 MED ORDER — DOCUSATE SODIUM 100 MG PO CAPS: 200.0000 mg | ORAL_CAPSULE | Freq: Two times a day (BID) | ORAL | Status: DC | PRN

## 2016-03-15 MED ORDER — PANTOPRAZOLE SODIUM 40 MG PO TBEC
40.0000 mg | DELAYED_RELEASE_TABLET | Freq: Every day | ORAL | Status: DC
  Administered 2016-03-16 – 2016-03-18 (×3): 40 mg via ORAL
  Filled 2016-03-15 (×3): qty 1

## 2016-03-15 MED ORDER — ENOXAPARIN SODIUM 40 MG/0.4ML ~~LOC~~ SOLN
40.0000 mg | Freq: Every day | SUBCUTANEOUS | Status: DC
  Administered 2016-03-15 – 2016-03-17 (×3): 40 mg via SUBCUTANEOUS
  Filled 2016-03-15 (×3): qty 0.4

## 2016-03-15 MED ORDER — FLUTICASONE PROPIONATE 50 MCG/ACT NA SUSP
1.0000 | Freq: Every day | NASAL | Status: DC | PRN
  Filled 2016-03-15: qty 16

## 2016-03-15 MED ORDER — FENTANYL 25 MCG/HR TD PT72: 50.0000 ug | MEDICATED_PATCH | TRANSDERMAL | Status: DC

## 2016-03-15 MED ORDER — ACETAMINOPHEN 325 MG PO TABS: 650.0000 mg | ORAL_TABLET | Freq: Four times a day (QID) | ORAL | Status: DC | PRN

## 2016-03-15 NOTE — Consult Note (Signed)
[name] Graham is a 65 y.o. female  admitted with abdominal pain possible ileus possible small bowel obstruction.  HPI: The patient has a history of uterine cancer a number of years ago when she underwent a radical hysterectomy followed by significant radiation. She's had a recurrence of her disease in the retroperitoneal lymph nodes and over the last year or 2 has had multiple episodes of abdominal pain distention nausea vomiting consistent with possible bowel obstruction possible ileus. She was recently treated with some significant cathartics with improvement in her symptoms initially. However her symptoms returned and she is having more problems over the last couple of days with abdominal pain nausea and constipation. CT scan suggested possible ileus possible small bowel obstruction although she did have contrast throughout the small bowel and into the colon. The surgical service was consulted.  Past Medical History  Diagnosis Date  . Cancer ([place]) 2012  . GI problem 2013  . Cancer of uterus ([place]) 04/18/2015  . Uterine cancer ([place])   . Wears hearing aid     bilateral   Past Surgical History  Procedure Laterality Date  . Abdominal hysterectomy    . Tonsillectomy    . Cataract extraction w/phaco Left 02/11/2016    Procedure: CATARACT EXTRACTION PHACO AND INTRAOCULAR LENS PLACEMENT (IOC);  Surgeon: Leandrew Koyanagi, MD;  Location: [facility];  Service: Ophthalmology;  Laterality: Left;   Social History   Social History  . Marital Status: Divorced    Spouse Name: N/A  . Number of Children: N/A  . Years of Education: N/A   Social History Main Topics  . Smoking status: Never Smoker   . Smokeless tobacco: Never Used  . Alcohol Use: 0.0 oz/week    0 Standard drinks or equivalent per week  . Drug Use: No  . Sexual Activity: Not Asked   Other Topics Concern  . None   Social History Narrative    Review of Systems: Review of Systems  Constitutional: Positive for weight  loss. Negative for fever, chills and malaise/fatigue.  HENT: Negative.   Eyes: Negative.   Respiratory: Negative for cough, shortness of breath and wheezing.   Cardiovascular: Negative for chest pain and palpitations.  Gastrointestinal: Positive for heartburn, nausea, vomiting, abdominal pain and constipation.  Genitourinary: Negative.   Musculoskeletal: Positive for back pain.  Skin: Negative.   Neurological: Negative.   Psychiatric/Behavioral: Negative.    10 point review of systems was performed and other than the current symptoms associated with the history of present illness there are no new problems.  PHYSICAL EXAM: BP 154/67 mmHg  Pulse 93  Temp(Src) 98.1 F (36.7 C) (Oral)  Resp 17  Ht 5\' 3"  (1.6 m)  Wt 53.978 kg (119 lb)  BMI 21.09 kg/m2  SpO2 98%  Physical Exam  Constitutional: She is oriented to person, place, and time. She appears well-developed and well-nourished. No distress.  HENT:  Head: Normocephalic and atraumatic.  Eyes: EOM are normal. Pupils are equal, round, and reactive to light.  Neck: Normal range of motion. Neck supple.  Cardiovascular: Normal rate and normal heart sounds.   Pulmonary/Chest: Effort normal and breath sounds normal.  Abdominal: Soft. Bowel sounds are normal. She exhibits distension. She exhibits no mass. There is tenderness. There is no rebound and no guarding. No hernia.  Musculoskeletal: Normal range of motion. She exhibits no edema or tenderness.  Neurological: She is alert and oriented to person, place, and time.  Skin: Skin is warm and dry.  Psychiatric: Her behavior  is normal. Judgment normal.   Her abdomen is mildly distended. She does not have any significant abdominal tenderness but does complain of some mild discomfort on the right side. I cannot palpate an abdominal mass. She does have hypoactive but present bowel sounds.  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 It is possible she  has a bowel obstruction from prior surgery or from recurrent surgery for uterine cancer. However I do not see a transition zone on her CT scan there does not appear to be an area that would be approachable from a surgery standpoint. The present time I would continue to treat her conservatively and to the point of nasogastric suction if she continues to have nausea and vomiting. I discussed this plan with her in detail. We will continue to follow her while she is hospitalized.   Dia Crawford III, MD  03/15/2016, 11:22 PM

## 2016-03-15 NOTE — ED Notes (Signed)
Pt c/o generalized abd pain that radiates into her back . States she has been getting outpatient treatment for a bowel obstruction without relief.. C/o nausea.. Denies vomiting.Marland Kitchen

## 2016-03-15 NOTE — Telephone Encounter (Signed)
Dr. Grayland Ormond recommends patient be evaluated at ER to rule out blockage since she has no improvement.  Patient advised and agreed.

## 2016-03-15 NOTE — ED Notes (Addendum)
Pt states she was sent by her oncologist for possible bowel obstruction, states she hasnt had a bowel movement since last Tuesday. Reports shes been taking miralax and multiple stool softeners

## 2016-03-15 NOTE — ED Notes (Signed)
Hospitalist at bedside 

## 2016-03-15 NOTE — H&P (Signed)
[facility] NAME: [name] Graham    MR#:  [id number]  DATE OF BIRTH:  Oct 24, 1951  DATE OF ADMISSION:  03/15/2016  PRIMARY CARE PHYSICIAN: [name], MD   REQUESTING/REFERRING PHYSICIAN: Dr. Charlotte Crumb  CHIEF COMPLAINT:   Chief Complaint  Patient presents with  . Abdominal Pain    HISTORY OF PRESENT ILLNESS:  [name] Graham  is a 65 y.o. female with a known history of Uterine cancer with recurrence who presents to the hospital due to abdominal pain that has been worse over the past week to 10 days. Dominant pain is on the right lower quadrant area associated with some nausea but no vomiting. She denies any fevers or chills associated with this. She did see her oncologist and they ordered an x-ray about a week or so ago which showed ileus versus partial small bowel obstruction. Patient wasn't placed on some lactulose which improved her symptoms and she actually had diarrhea with it. She stop taking the lactulose and has currently been on some MiraLAX and Colace but has not had a bowel movement in over a week and is coming to the hospital with worsening abdominal pain. Patient CT scan of the abdomen and pelvis here in the emergency room was consistent with ileus versus partial small bowel obstruction and hospitalist services were contacted further treatment and evaluation.  PAST MEDICAL HISTORY:   Past Medical History  Diagnosis Date  . Cancer ([place]) 2012  . GI problem 2013  . Cancer of uterus ([place]) 04/18/2015  . Uterine cancer ([place])   . Wears hearing aid     bilateral    PAST SURGICAL HISTORY:   Past Surgical History  Procedure Laterality Date  . Abdominal hysterectomy    . Tonsillectomy    . Cataract extraction w/phaco Left 02/11/2016    Procedure: CATARACT EXTRACTION PHACO AND INTRAOCULAR LENS PLACEMENT (IOC);  Surgeon: Leandrew Koyanagi, MD;  Location: [facility];  Service: Ophthalmology;   Laterality: Left;    SOCIAL HISTORY:   Social History  Substance Use Topics  . Smoking status: Never Smoker   . Smokeless tobacco: Never Used  . Alcohol Use: 0.0 oz/week    0 Standard drinks or equivalent per week    FAMILY HISTORY:   Family History  Problem Relation Age of Onset  . Hypertension Other   . Colon cancer Mother     DRUG ALLERGIES:   Allergies  Allergen Reactions  . Penicillins Rash and Other (See Comments)     Has patient had a PCN reaction causing immediate rash, facial/tongue/throat swelling, SOB or lightheadedness with hypotension: No Has patient had a PCN reaction causing severe rash involving mucus membranes or skin necrosis: No Has patient had a PCN reaction that required hospitalization No Has patient had a PCN reaction occurring within the last 10 years: No If all of the above answers are "NO", then may proceed with Cephalosporin use.    REVIEW OF SYSTEMS:   Review of Systems  Constitutional: Negative for fever and weight loss.  HENT: Negative for congestion, nosebleeds and tinnitus.   Eyes: Negative for blurred vision, double vision and redness.  Respiratory: Negative for cough, hemoptysis and shortness of breath.   Cardiovascular: Negative for chest pain, orthopnea, leg swelling and PND.  Gastrointestinal: Positive for nausea and abdominal pain. Negative for vomiting, diarrhea and melena.  Genitourinary: Negative for dysuria, urgency and hematuria.  Musculoskeletal: Negative for joint pain and falls.  Neurological: Negative  for dizziness, tingling, sensory change, focal weakness, seizures, weakness and headaches.  Endo/Heme/Allergies: Negative for polydipsia. Does not bruise/bleed easily.  Psychiatric/Behavioral: Negative for depression and memory loss. The patient is not nervous/anxious.     MEDICATIONS AT HOME:   Prior to Admission medications   Medication Sig Start Date End Date Taking? Authorizing Provider  acetaminophen (TYLENOL) 500  MG tablet Take 1,000 mg by mouth every 6 (six) hours as needed for mild pain or headache.    Yes Historical Provider, MD  ALPRAZolam Duanne Moron) 0.5 MG tablet Take 1 tablet (0.5 mg total) by mouth 2 (two) times daily. 02/04/16  Yes Forest Gleason, MD  amitriptyline (ELAVIL) 25 MG tablet Take 1 tablet (25 mg total) by mouth at bedtime. 12/09/15  Yes Forest Gleason, MD  cetirizine (ZYRTEC) 10 MG tablet Take 10 mg by mouth daily as needed for allergies.    Yes Historical Provider, MD  docusate sodium (COLACE) 100 MG capsule Take 200 mg by mouth 2 (two) times daily as needed for mild constipation.    Yes Historical Provider, MD  fentaNYL (DURAGESIC - DOSED MCG/HR) 50 MCG/HR Place 50 mcg onto the skin every 3 (three) days.   Yes Historical Provider, MD  fluticasone (FLONASE) 50 MCG/ACT nasal spray Place 1-2 sprays into both nostrils daily as needed for rhinitis.    Yes Historical Provider, MD  HYDROcodone-acetaminophen (NORCO/VICODIN) 5-325 MG tablet Take 1 tablet by mouth every 6 (six) hours as needed for moderate pain. 02/27/16  Yes Forest Gleason, MD  lactulose (CHRONULAC) 10 GM/15ML solution Take 30 g by mouth 2 (two) times daily as needed for severe constipation.   Yes Historical Provider, MD  megestrol (MEGACE) 400 MG/10ML suspension Take 10 mLs (400 mg total) by mouth 2 (two) times daily. 02/05/16  Yes Forest Gleason, MD  metoCLOPramide (REGLAN) 10 MG tablet Take 1 tablet (10 mg total) by mouth 4 (four) times daily as needed for nausea. 01/07/16  Yes Forest Gleason, MD  mirtazapine (REMERON) 15 MG tablet Take 15 mg by mouth at bedtime.    Yes Historical Provider, MD  omeprazole (PRILOSEC) 20 MG capsule Take 20 mg by mouth 2 (two) times daily before a meal.   Yes Historical Provider, MD  ondansetron (ZOFRAN) 4 MG tablet Take 1 tablet (4 mg total) by mouth every 6 (six) hours as needed for nausea or vomiting. 01/07/16  Yes Evlyn Kanner, NP  polyethylene glycol (MIRALAX / GLYCOLAX) packet Take 17 g by mouth at bedtime.     Yes Historical Provider, MD  potassium chloride (K-DUR,KLOR-CON) 10 MEQ tablet Take 10 mEq by mouth daily.   Yes Historical Provider, MD  predniSONE (DELTASONE) 20 MG tablet Take 1 tablet (20 mg total) by mouth daily with breakfast. 01/01/16  Yes Forest Gleason, MD  sertraline (ZOLOFT) 100 MG tablet Take 100 mg by mouth daily.   Yes Historical Provider, MD  fentaNYL (DURAGESIC - DOSED MCG/HR) 12 MCG/HR Place 1 patch (12.5 mcg total) onto the skin every 3 (three) days. Use with 36mcg patch for total dose of 37.51mcg. Patient not taking: Reported on 03/15/2016 02/17/16   Forest Gleason, MD  fentaNYL (DURAGESIC - DOSED MCG/HR) 25 MCG/HR patch Place 1 patch (25 mcg total) onto the skin every 3 (three) days. Use with 12.43mcg patch for total dose 37.75mcg. Patient not taking: Reported on 03/15/2016 02/17/16   Forest Gleason, MD  oxyCODONE (OXY IR/ROXICODONE) 5 MG immediate release tablet Take 1 tablet (5 mg total) by mouth every 4 (four) hours as  needed for severe pain. Patient not taking: Reported on 03/15/2016 03/09/16   Evlyn Kanner, NP      VITAL SIGNS:  Blood pressure 155/91, pulse 100, temperature 97.6 F (36.4 C), temperature source Oral, resp. rate 18, height 5\' 3"  (1.6 m), weight 53.978 kg (119 lb), SpO2 96 %.  PHYSICAL EXAMINATION:  Physical Exam  GENERAL:  65 y.o.-year-old patient lying in the bed in NAD.   EYES: Pupils equal, round, reactive to light and accommodation. No scleral icterus. Extraocular muscles intact.  HEENT: Head atraumatic, normocephalic. Oropharynx and nasopharynx clear. No oropharyngeal erythema, moist oral mucosa  NECK:  Supple, no jugular venous distention. No thyroid enlargement, no tenderness.  LUNGS: Normal breath sounds bilaterally, no wheezing, rales, rhonchi. No use of accessory muscles of respiration.  CARDIOVASCULAR: S1, S2 RRR. No murmurs, rubs, gallops, clicks.  ABDOMEN: Soft, tender in the right lower quadrant. No rebound, rigidity. nondistended. Bowel sounds  present. No organomegaly or mass.  EXTREMITIES: No pedal edema, cyanosis, or clubbing. + 2 pedal & radial pulses b/l.   NEUROLOGIC: Cranial nerves II through XII are intact. No focal Motor or sensory deficits appreciated b/l PSYCHIATRIC: The patient is alert and oriented x 3. Good affect.  SKIN: No obvious rash, lesion, or ulcer.   LABORATORY PANEL:   CBC  Recent Labs Lab 03/15/16 1327  WBC 11.2*  HGB 8.2*  HCT 25.4*  PLT 379   ------------------------------------------------------------------------------------------------------------------  Chemistries   Recent Labs Lab 03/09/16 0837 03/15/16 1327  NA 132* 131*  K 4.1 4.2  CL 102 102  CO2 22 19*  GLUCOSE 116* 149*  BUN 38* 31*  CREATININE 1.29* 1.42*  CALCIUM 8.7* 9.0  MG 2.0  --   AST 13* 16  ALT 24 18  ALKPHOS 97 97  BILITOT 0.4 0.7   ------------------------------------------------------------------------------------------------------------------  Cardiac Enzymes No results for input(s): TROPONINI in the last 168 hours. ------------------------------------------------------------------------------------------------------------------  RADIOLOGY:  Ct Abdomen Pelvis W Contrast  03/15/2016  CLINICAL DATA:  Generalized abdominal pain radiating to the back. Endometrial cancer. EXAM: CT ABDOMEN AND PELVIS WITH CONTRAST TECHNIQUE: Multidetector CT imaging of the abdomen and pelvis was performed using the standard protocol following bolus administration of intravenous contrast. CONTRAST:  69mL ISOVUE-300 IOPAMIDOL (ISOVUE-300) INJECTION 61% COMPARISON:  03/09/2016 CT abdomen/ pelvis and 03/11/2016 abdominal radiographs. FINDINGS: Lower chest: Subpleural 4 mm medial left lower lobe pulmonary nodule (series 5/image 4), stable since 04/03/2013 and probably benign. Hepatobiliary: Normal liver with no liver mass. Contracted gallbladder with no radiopaque cholelithiasis. No biliary ductal dilatation. Pancreas: Normal, with no  mass or duct dilation. Spleen: Normal size. No mass. Adrenals/Urinary Tract: Normal adrenals. No hydronephrosis. No renal mass. Collapsed and grossly normal bladder. Stomach/Bowel: Grossly normal stomach. There are mildly dilated small bowel loops throughout the abdomen, maximum diameter 3.2 cm in the pelvic small bowel. The terminal ileum is collapsed. Oral contrast progresses to the mid pelvic small bowel. No discrete small bowel caliber transition. No small bowel wall thickening. Appendix is not discretely visualized. No inflammatory changes in the right lower quadrant of the abdomen at the cecal base. No large bowel wall thickening or colonic diverticulosis. There is moderate diffuse distention of the large bowel and rectum by stool and gas. Vascular/Lymphatic: Atherosclerotic nonaneurysmal abdominal aorta. Stable extrinsic narrowing of the left renal vein by retroperitoneal adenopathy. Hepatic, portal, splenic and right renal veins appear patent. Confluent 6.8 x 5.5 cm retroperitoneal adenopathy encompassing the left para-aortic and aortocaval nodal chains with encasement of the abdominal aorta and  partial encasement of the IVC (series 2/image 28), previously 6.6 x 5.8 cm, not appreciably changed. Reproductive: Status post hysterectomy, with no abnormal findings at the vaginal cuff. No adnexal mass. Other: No pneumoperitoneum, ascites or focal fluid collection. Musculoskeletal: Stable mixed lytic and sclerotic osseous lesion in the anterior L2 vertebral body. No new focal osseous lesions. IMPRESSION: 1. Mildly dilated small bowel loops to the mid pelvic small bowel with collapsed terminal ileum. Oral contrast progresses only to the mid pelvic small bowel. No discrete small bowel caliber transition. Given the moderate diffuse large bowel distention by stool and gas, diffuse adynamic ileus is favored, although a partial distal small bowel obstruction cannot be excluded. Consider follow-up abdominal radiographs to  evaluate for passage of oral contrast. 2. Stable bulky confluent retroperitoneal adenopathy encasing the abdominal aorta, in keeping with nodal metastases. 3. Stable mixed lytic and sclerotic osseous lesion in the anterior L2 vertebral body, suspicious for bone metastasis. Electronically Signed   By: Ilona Sorrel M.D.   On: 03/15/2016 17:43     IMPRESSION AND PLAN:   65 year old female with past medical history of uterine cancer with recurrence of presents to the hospital with abdominal pain, nausea.  1. Ileus/partial small bowel obstruction-this is the cause of patient's worsening abdominal pain and nausea. -We'll continue supportive care with IV fluids, antiemetics. -I will start the patient on some lactulose as it seems to have improved his symptoms previously. -Consult general surgery. Continue supportive care as mentioned.  2. Uterine cancer with recurrence-patient has evidence of bony metastases on the CT scan and also retroperitoneal adenopathy. -Continue follow-up with the cancer Center. -Continue fentanyl patch, Remeron, Megace.  3. Depression-continue Zoloft, Elavil  4. GERD-continue Protonix.   All the records are reviewed and case discussed with ED provider. Management plans discussed with the patient, family and they are in agreement.  CODE STATUS: Full  TOTAL TIME TAKING CARE OF THIS PATIENT: 45 minutes.    Henreitta Leber M.D on 03/15/2016 at 7:40 PM  Between 7am to 6pm - Pager - [phone]  After 6pm go to [email or web address] - password [email or web address] [facility]  Office  [phone]  CC: Primary care physician; [name], MD

## 2016-03-15 NOTE — ED Provider Notes (Signed)
[facility] Emergency Department Provider Note  ____________________________________________   I have reviewed the triage vital signs and the nursing notes.   HISTORY  Chief Complaint Abdominal Pain    HPI [name] Graham is a 65 y.o. female HPI [name] Graham is a 65 y.o. female with a history of she describes as uterine cancer with metastatic disease, she states that she has had abdominal pain since last week or longer. She had a CT which showed significant constipation. On Tuesday she had a laxative which resulted in more stooling than she can count and she felt like she was washed out from that. However, patient states she has not then had a bowel movement in the last week and she has increasing abdominal pain diffusely. She denies any fever. She is not vomiting but she does feel nauseated. She feels mildly dehydrated. She is not currently getting chemotherapy. The pain is diffuse but more on the right than the left. Her last CT scan did not show any acute obstruction however there was significant lymphadenopathy noted.  Past Medical History  Diagnosis Date  . Cancer ([place]) 2012  . GI problem 2013  . Cancer of uterus ([place]) 04/18/2015  . Uterine cancer ([place])   . Wears hearing aid     bilateral    Patient Active Problem List   Diagnosis Date Noted  . Dehydration 12/26/2015  . Ileus ([place]) 12/22/2015  . Cancer of uterus ([place]) 04/18/2015  . 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 Clinical depression 05/20/2014  . Absolute anemia 05/20/2014  . Recurrent major depressive disorder, in partial remission ([place]) 05/20/2014  . Malignant neoplasm of uterus ([place]) 05/20/2014  . History of endometrial cancer 04/16/2013  . Lymphadenopathy, cervical 04/16/2013    Past Surgical History  Procedure Laterality Date  . Abdominal hysterectomy    . Tonsillectomy    . Cataract extraction w/phaco Left 02/11/2016    Procedure: CATARACT EXTRACTION PHACO AND  INTRAOCULAR LENS PLACEMENT (IOC);  Surgeon: Leandrew Koyanagi, MD;  Location: [facility];  Service: Ophthalmology;  Laterality: Left;    Current Outpatient Rx  Name  Route  Sig  Dispense  Refill  . acetaminophen (TYLENOL) 500 MG tablet   Oral   Take 500 mg by mouth every 6 (six) hours as needed.         . ALPRAZolam (XANAX) 0.5 MG tablet   Oral   Take 1 tablet (0.5 mg total) by mouth 2 (two) times daily.   60 tablet   3   . amitriptyline (ELAVIL) 25 MG tablet   Oral   Take 1 tablet (25 mg total) by mouth at bedtime.   30 tablet   3   . Ca Carbonate-Mag Hydroxide (ROLAIDS) 550-110 MG CHEW   Oral   Chew 2 tablets by mouth as needed (for indigestion).         Marland Kitchen docusate sodium (COLACE) 100 MG capsule   Oral   Take 100 mg by mouth 2 (two) times daily as needed for mild constipation.         . fentaNYL (DURAGESIC - DOSED MCG/HR) 12 MCG/HR   Transdermal   Place 1 patch (12.5 mcg total) onto the skin every 3 (three) days. Use with 76mcg patch for total dose of 37.46mcg.   10 patch   0   . fentaNYL (DURAGESIC - DOSED MCG/HR) 25 MCG/HR patch   Transdermal   Place 1 patch (25 mcg total) onto the skin every 3 (three)  days. Use with 12.20mcg patch for total dose 37.60mcg.   10 patch   0   . fluticasone (FLONASE) 50 MCG/ACT nasal spray   Nasal   Place 1-2 sprays into the nose daily as needed for allergies or rhinitis.          Marland Kitchen HYDROcodone-acetaminophen (NORCO/VICODIN) 5-325 MG tablet   Oral   Take 1 tablet by mouth every 6 (six) hours as needed for moderate pain.   60 tablet   0   . lactulose (CHRONULAC) 10 GM/15ML solution   Oral   Take 45 mLs (30 g total) by mouth daily as needed for severe constipation.   240 mL   0   . meclizine (ANTIVERT) 25 MG tablet   Oral   Take 25 mg by mouth 4 (four) times daily as needed for dizziness.         . megestrol (MEGACE) 400 MG/10ML suspension   Oral   Take 10 mLs (400 mg total) by mouth 2 (two) times daily.    480 mL   3   . metoCLOPramide (REGLAN) 10 MG tablet   Oral   Take 1 tablet (10 mg total) by mouth 4 (four) times daily as needed for nausea.   60 tablet   3   . mirtazapine (REMERON) 15 MG tablet   Oral   Take 15 mg by mouth at bedtime.       3   . omeprazole (PRILOSEC) 20 MG capsule   Oral   Take 1 capsule (20 mg total) by mouth daily.   30 capsule   6   . ondansetron (ZOFRAN) 4 MG tablet   Oral   Take 1 tablet (4 mg total) by mouth every 6 (six) hours as needed for nausea or vomiting.   60 tablet   3   . oxyCODONE (OXY IR/ROXICODONE) 5 MG immediate release tablet   Oral   Take 1 tablet (5 mg total) by mouth every 4 (four) hours as needed for severe pain.   30 tablet   0   . polyethylene glycol (MIRALAX / GLYCOLAX) packet   Oral   Take 17 g by mouth daily. Patient taking differently: Take 17 g by mouth daily as needed.    30 each   0   . potassium chloride (K-DUR) 10 MEQ tablet      TAKE 1 TABLET(10 MEQ) BY MOUTH DAILY   90 tablet   3     **Patient requests 90 days supply**   . predniSONE (DELTASONE) 20 MG tablet   Oral   Take 1 tablet (20 mg total) by mouth daily with breakfast.   30 tablet   3   . sertraline (ZOLOFT) 50 MG tablet      TAKE 1 TABLET BY MOUTH EVERY MORNING   30 tablet   0     Allergies Penicillins  Family History  Problem Relation Age of Onset  . Hypertension Other     Social History Social History  Substance Use Topics  . Smoking status: Never Smoker   . Smokeless tobacco: Never Used  . Alcohol Use: 0.0 oz/week    0 Standard drinks or equivalent per week    Review of Systems Constitutional: No fever/chills Eyes: No visual changes. ENT: No sore throat. No stiff neck no neck pain Cardiovascular: Denies chest pain. Respiratory: Denies shortness of breath. Gastrointestinal:   no vomiting.  No diarrhea.  Positive constipation Genitourinary: Negative for dysuria. Musculoskeletal: Negative lower extremity  swelling  Skin: Negative for rash. Neurological: Negative for headaches, focal weakness or numbness. 10-point ROS otherwise negative.  ____________________________________________   PHYSICAL EXAM:  VITAL SIGNS: ED Triage Vitals  Enc Vitals Group     BP 03/15/16 1322 142/73 mmHg     Pulse Rate 03/15/16 1322 114     Resp 03/15/16 1322 18     Temp 03/15/16 1322 97.6 F (36.4 C)     Temp Source 03/15/16 1322 Oral     SpO2 03/15/16 1322 96 %     Weight 03/15/16 1322 119 lb (53.978 kg)     Height 03/15/16 1322 5\' 3"  (1.6 m)     Head Cir --      Peak Flow --      Pain Score 03/15/16 1323 8     Pain Loc --      Pain Edu? --      Excl. in [place]? --     Constitutional: Alert and oriented. Well appearing and in no acute distress. Eyes: Conjunctivae are normal. PERRL. EOMI. Head: Atraumatic. Nose: No congestion/rhinnorhea. Mouth/Throat: Mucous membranes are moist.  Oropharynx non-erythematous. Neck: No stridor.   Nontender with no meningismus Cardiovascular: Normal rate, regular rhythm. Grossly normal heart sounds.  Good peripheral circulation. Respiratory: Normal respiratory effort.  No retractions. Lungs CTAB. Abdominal: Soft but diffusely tender specially in the lower abdomen, not distended but voluntary guarding with no rebound Back:  There is no focal tenderness or step off there is no midline tenderness there are no lesions noted. there is no CVA tenderness Musculoskeletal: No lower extremity tenderness. No joint effusions, no DVT signs strong distal pulses no edema Neurologic:  Normal speech and language. No gross focal neurologic deficits are appreciated.  Skin:  Skin is warm, dry and intact. No rash noted. Psychiatric: Mood and affect are normal. Speech and behavior are normal.  ____________________________________________   LABS (all labs ordered are listed, but only abnormal results are displayed)  Labs Reviewed  COMPREHENSIVE METABOLIC PANEL - Abnormal; Notable for the  following:    Sodium 131 (*)    CO2 19 (*)    Glucose, Bld 149 (*)    BUN 31 (*)    Creatinine, Ser 1.42 (*)    Albumin 2.7 (*)    GFR calc non Af Amer 37 (*)    GFR calc Af Amer 43 (*)    All other components within normal limits  CBC - Abnormal; Notable for the following:    WBC 11.2 (*)    RBC 2.82 (*)    Hemoglobin 8.2 (*)    HCT 25.4 (*)    RDW 17.0 (*)    All other components within normal limits  LIPASE, BLOOD  URINALYSIS COMPLETEWITH MICROSCOPIC ([facility] ONLY)   ____________________________________________  EKG  I personally interpreted any EKGs ordered by me or triage  ____________________________________________  RADIOLOGY  I reviewed any imaging ordered by me or triage that were performed during my shift and, if possible, patient and/or family made aware of any abnormal findings. ____________________________________________   PROCEDURES  Procedure(s) performed: None  Critical Care performed: None  ____________________________________________   INITIAL IMPRESSION / ASSESSMENT AND PLAN / ED COURSE  Pertinent labs & imaging results that were available during my care of the patient were reviewed by me and considered in my medical decision making (see chart for details).  Patient with significant abdominal pain. She certainly could simply be obstipated, however, given her history of chemotherapy and multiple lymphadenopathy in her abdomen, I have  concern that her constipation all diagnoses was not sufficient to explain her symptoms. Unfortunately I believe this will require further imaging.  ____________________________________________   FINAL CLINICAL IMPRESSION(S) / ED DIAGNOSES  Final diagnoses:  None      This chart was dictated using voice recognition software.  Despite best efforts to proofread,  errors can occur which can change meaning.     Schuyler Amor, MD 03/15/16 [phone]

## 2016-03-15 NOTE — ED Notes (Signed)
Pt taken to CT.

## 2016-03-15 NOTE — Telephone Encounter (Signed)
Was told to call with an update today.  She is still having a lot of abdominal pain, only had 1 bowel movement since last Tuesday also feeling weak and lightheaded.

## 2016-03-16 DIAGNOSIS — R1031 Right lower quadrant pain: Secondary | ICD-10-CM

## 2016-03-16 DIAGNOSIS — K5669 Other intestinal obstruction: Secondary | ICD-10-CM

## 2016-03-16 DIAGNOSIS — K567 Ileus, unspecified: Principal | ICD-10-CM

## 2016-03-16 LAB — CBC
HCT: 24.6 % — ABNORMAL LOW (ref 35.0–47.0)
Hemoglobin: 7.9 g/dL — ABNORMAL LOW (ref 12.0–16.0)
MCH: 29 pg (ref 26.0–34.0)
MCHC: 32.2 g/dL (ref 32.0–36.0)
MCV: 90 fL (ref 80.0–100.0)
PLATELETS: 318 10*3/uL (ref 150–440)
RBC: 2.74 MIL/uL — AB (ref 3.80–5.20)
RDW: 16.9 % — AB (ref 11.5–14.5)
WBC: 9 10*3/uL (ref 3.6–11.0)

## 2016-03-16 LAB — BASIC METABOLIC PANEL
Anion gap: 9 (ref 5–15)
BUN: 21 mg/dL — ABNORMAL HIGH (ref 6–20)
CO2: 21 mmol/L — ABNORMAL LOW (ref 22–32)
Calcium: 8.8 mg/dL — ABNORMAL LOW (ref 8.9–10.3)
Chloride: 102 mmol/L (ref 101–111)
Creatinine, Ser: 1.14 mg/dL — ABNORMAL HIGH (ref 0.44–1.00)
GFR calc Af Amer: 56 mL/min — ABNORMAL LOW (ref >60)
GFR calc non Af Amer: 49 mL/min — ABNORMAL LOW (ref >60)
Glucose, Bld: 76 mg/dL (ref 65–99)
Potassium: 3.4 mmol/L — ABNORMAL LOW (ref 3.5–5.1)
Sodium: 132 mmol/L — ABNORMAL LOW (ref 135–145)

## 2016-03-16 LAB — MAGNESIUM: Magnesium: 2.1 mg/dL (ref 1.7–2.4)

## 2016-03-16 MED ORDER — FENTANYL 25 MCG/HR TD PT72
50.0000 ug | MEDICATED_PATCH | TRANSDERMAL | Status: DC
Start: 2016-03-17 — End: 2016-03-18
  Administered 2016-03-17: 50 ug via TRANSDERMAL
  Filled 2016-03-16: qty 2

## 2016-03-16 MED ORDER — POTASSIUM CHLORIDE CRYS ER 20 MEQ PO TBCR
40.0000 meq | EXTENDED_RELEASE_TABLET | Freq: Once | ORAL | Status: AC
  Administered 2016-03-16: 40 meq via ORAL
  Filled 2016-03-16: qty 2

## 2016-03-16 NOTE — Progress Notes (Signed)
65 year old female with endometrial cancer and a ileus. Patient has had some bowel movements today and is passing gas. Patient states that she does feel better. Patient states that she is feeling some pain every now on the right side of her abdomen that is overall improved.   Filed Vitals:   03/16/16 1227 03/16/16 2018  BP: 129/73 159/86  Pulse: 106 99  Temp: 98.6 F (37 C) 98.7 F (37.1 C)  Resp: 17 19   I/O last 3 completed shifts: In: 1011 [P.O.:280; I.V.:731] Out: 3 [Urine:3]      PE:  Gen NAD Res: CTAB/L  Cardio: RRRR Abd: soft, mildly distended, non tender Ext: no edema   CBC Latest Ref Rng 03/16/2016 03/15/2016 03/09/2016  WBC 3.6 - 11.0 K/uL 9.0 11.2(H) 9.6  Hemoglobin 12.0 - 16.0 g/dL 7.9(L) 8.2(L) 8.4(L)  Hematocrit 35.0 - 47.0 % 24.6(L) 25.4(L) 25.8(L)  Platelets 150 - 440 K/uL 318 379 327   CMP Latest Ref Rng 03/16/2016 03/15/2016 03/09/2016  Glucose 65 - 99 mg/dL 76 149(H) 116(H)  BUN 6 - 20 mg/dL 21(H) 31(H) 38(H)  Creatinine 0.44 - 1.00 mg/dL 1.14(H) 1.42(H) 1.29(H)  Sodium 135 - 145 mmol/L 132(L) 131(L) 132(L)  Potassium 3.5 - 5.1 mmol/L 3.4(L) 4.2 4.1  Chloride 101 - 111 mmol/L 102 102 102  CO2 22 - 32 mmol/L 21(L) 19(L) 22  Calcium 8.9 - 10.3 mg/dL 8.8(L) 9.0 8.7(L)  Total Protein 6.5 - 8.1 g/dL - 7.3 7.0  Total Bilirubin 0.3 - 1.2 mg/dL - 0.7 0.4  Alkaline Phos 38 - 126 U/L - 97 97  AST 15 - 41 U/L - 16 13(L)  ALT 14 - 54 U/L - 18 24     A/P:  65 year old female with endometrial cancer and now with an ileus. Patient seems to be resolving this rather quickly and her white count is now improved as well.  She is tolerating clear liquids and would advance her as she tolerates. Need for surgical intervention this time

## 2016-03-16 NOTE — Progress Notes (Addendum)
[facility] [place] NAME: [name] Graham    MR#:  [id number]  DATE OF BIRTH:  1951/06/24  SUBJECTIVE:  CHIEF COMPLAINT:   Chief Complaint  Patient presents with  . Abdominal Pain   Abdominal pain and nausea but better, had BM. REVIEW OF SYSTEMS:  CONSTITUTIONAL: No fever, fatigue or weakness.  EYES: No blurred or double vision.  EARS, NOSE, AND THROAT: No tinnitus or ear pain.  RESPIRATORY: No cough, shortness of breath, wheezing or hemoptysis.  CARDIOVASCULAR: No chest pain, orthopnea, edema.  GASTROINTESTINAL: has abdominal pain and nausea, no vomiting or diarrhea.  GENITOURINARY: No dysuria, hematuria.  ENDOCRINE: No polyuria, nocturia,  HEMATOLOGY: No anemia, easy bruising or bleeding SKIN: No rash or lesion. MUSCULOSKELETAL: No joint pain or arthritis.   NEUROLOGIC: No tingling, numbness, weakness.  PSYCHIATRY: No anxiety or depression.   DRUG ALLERGIES:   Allergies  Allergen Reactions  . Penicillins Rash and Other (See Comments)     Has patient had a PCN reaction causing immediate rash, facial/tongue/throat swelling, SOB or lightheadedness with hypotension: No Has patient had a PCN reaction causing severe rash involving mucus membranes or skin necrosis: No Has patient had a PCN reaction that required hospitalization No Has patient had a PCN reaction occurring within the last 10 years: No If all of the above answers are "NO", then may proceed with Cephalosporin use.    VITALS:  Blood pressure 129/73, pulse 106, temperature 98.6 F (37 C), temperature source Axillary, resp. rate 17, height 5\' 3"  (1.6 m), weight 53.978 kg (119 lb), SpO2 95 %.  PHYSICAL EXAMINATION:  GENERAL:  65 y.o.-year-old patient lying in the bed with no acute distress.  EYES: Pupils equal, round, reactive to light and accommodation. No scleral icterus. Extraocular muscles intact.  HEENT: Head atraumatic, normocephalic. Oropharynx and  nasopharynx clear.  NECK:  Supple, no jugular venous distention. No thyroid enlargement, no tenderness.  LUNGS: Normal breath sounds bilaterally, no wheezing, rales,rhonchi or crepitation. No use of accessory muscles of respiration.  CARDIOVASCULAR: S1, S2 normal. No murmurs, rubs, or gallops.  ABDOMEN: Soft, mild tenderness on RLQ, nondistended. Bowel sounds present. No organomegaly or mass.  EXTREMITIES: No pedal edema, cyanosis, or clubbing.  NEUROLOGIC: Cranial nerves II through XII are intact. Muscle strength 5/5 in all extremities. Sensation intact. Gait not checked.  PSYCHIATRIC: The patient is alert and oriented x 3.  SKIN: No obvious rash, lesion, or ulcer.    LABORATORY PANEL:   CBC  Recent Labs Lab 03/16/16 0417  WBC 9.0  HGB 7.9*  HCT 24.6*  PLT 318   ------------------------------------------------------------------------------------------------------------------  Chemistries   Recent Labs Lab 03/15/16 1327 03/16/16 0414 03/16/16 0417  NA 131*  --  132*  K 4.2  --  3.4*  CL 102  --  102  CO2 19*  --  21*  GLUCOSE 149*  --  76  BUN 31*  --  21*  CREATININE 1.42*  --  1.14*  CALCIUM 9.0  --  8.8*  MG  --  2.1  --   AST 16  --   --   ALT 18  --   --   ALKPHOS 97  --   --   BILITOT 0.7  --   --    ------------------------------------------------------------------------------------------------------------------  Cardiac Enzymes No results for input(s): TROPONINI in the last 168 hours. ------------------------------------------------------------------------------------------------------------------  RADIOLOGY:  Ct Abdomen Pelvis W Contrast  03/15/2016  CLINICAL DATA:  Generalized abdominal pain radiating  to the back. Endometrial cancer. EXAM: CT ABDOMEN AND PELVIS WITH CONTRAST TECHNIQUE: Multidetector CT imaging of the abdomen and pelvis was performed using the standard protocol following bolus administration of intravenous contrast. CONTRAST:  71mL  ISOVUE-300 IOPAMIDOL (ISOVUE-300) INJECTION 61% COMPARISON:  03/09/2016 CT abdomen/ pelvis and 03/11/2016 abdominal radiographs. FINDINGS: Lower chest: Subpleural 4 mm medial left lower lobe pulmonary nodule (series 5/image 4), stable since 04/03/2013 and probably benign. Hepatobiliary: Normal liver with no liver mass. Contracted gallbladder with no radiopaque cholelithiasis. No biliary ductal dilatation. Pancreas: Normal, with no mass or duct dilation. Spleen: Normal size. No mass. Adrenals/Urinary Tract: Normal adrenals. No hydronephrosis. No renal mass. Collapsed and grossly normal bladder. Stomach/Bowel: Grossly normal stomach. There are mildly dilated small bowel loops throughout the abdomen, maximum diameter 3.2 cm in the pelvic small bowel. The terminal ileum is collapsed. Oral contrast progresses to the mid pelvic small bowel. No discrete small bowel caliber transition. No small bowel wall thickening. Appendix is not discretely visualized. No inflammatory changes in the right lower quadrant of the abdomen at the cecal base. No large bowel wall thickening or colonic diverticulosis. There is moderate diffuse distention of the large bowel and rectum by stool and gas. Vascular/Lymphatic: Atherosclerotic nonaneurysmal abdominal aorta. Stable extrinsic narrowing of the left renal vein by retroperitoneal adenopathy. Hepatic, portal, splenic and right renal veins appear patent. Confluent 6.8 x 5.5 cm retroperitoneal adenopathy encompassing the left para-aortic and aortocaval nodal chains with encasement of the abdominal aorta and partial encasement of the IVC (series 2/image 28), previously 6.6 x 5.8 cm, not appreciably changed. Reproductive: Status post hysterectomy, with no abnormal findings at the vaginal cuff. No adnexal mass. Other: No pneumoperitoneum, ascites or focal fluid collection. Musculoskeletal: Stable mixed lytic and sclerotic osseous lesion in the anterior L2 vertebral body. No new focal osseous  lesions. IMPRESSION: 1. Mildly dilated small bowel loops to the mid pelvic small bowel with collapsed terminal ileum. Oral contrast progresses only to the mid pelvic small bowel. No discrete small bowel caliber transition. Given the moderate diffuse large bowel distention by stool and gas, diffuse adynamic ileus is favored, although a partial distal small bowel obstruction cannot be excluded. Consider follow-up abdominal radiographs to evaluate for passage of oral contrast. 2. Stable bulky confluent retroperitoneal adenopathy encasing the abdominal aorta, in keeping with nodal metastases. 3. Stable mixed lytic and sclerotic osseous lesion in the anterior L2 vertebral body, suspicious for bone metastasis. Electronically Signed   By: Ilona Sorrel M.D.   On: 03/15/2016 17:43    EKG:   Orders placed or performed during the hospital encounter of 12/26/15  . ED EKG  . ED EKG    ASSESSMENT AND PLAN:   65 year old female with past medical history of uterine cancer with recurrence of presents to the hospital with abdominal pain, nausea.  1. Ileus/partial small bowel obstruction. Improving, continue IV fluids, antiemetics. She had BM with lactulose. Advance to full liquid diet.  2. Uterine cancer with recurrence-patient has evidence of bony metastases on the CT scan and also retroperitoneal adenopathy. -Continue follow-up with the cancer Center. -Continue fentanyl patch, Remeron, Megace.  3. Depression-continue Zoloft, Elavil  4. GERD-continue Protonix.  * Hyponatremia. Continue NS iv, f/u BMP. * Hypokalemia. Given potassium and follow-up level.  *Anemia of chronic disease. Stable.  All the records are reviewed and case discussed with Care Management/Social Workerr. Management plans discussed with the patient, family and they are in agreement.  CODE STATUS: full code.  TOTAL TIME TAKING CARE OF  THIS PATIENT: 35 minutes.  Greater than 50% time was spent on coordination of care and  face-to-face counseling.  POSSIBLE D/C IN 2 DAYS, DEPENDING ON CLINICAL CONDITION.   Demetrios Loll M.D on 03/16/2016 at 1:54 PM  Between 7am to 6pm - Pager - [phone]  After 6pm go to [email or web address] - password [email or web address] [facility]  Office  [phone]  CC: Primary care physician; [name], MD

## 2016-03-17 LAB — BASIC METABOLIC PANEL
Anion gap: 6 (ref 5–15)
BUN: 13 mg/dL (ref 6–20)
CALCIUM: 8.8 mg/dL — AB (ref 8.9–10.3)
CHLORIDE: 108 mmol/L (ref 101–111)
CO2: 22 mmol/L (ref 22–32)
CREATININE: 1.07 mg/dL — AB (ref 0.44–1.00)
GFR calc non Af Amer: 52 mL/min — ABNORMAL LOW (ref >60)
Glucose, Bld: 75 mg/dL (ref 65–99)
Potassium: 4.6 mmol/L (ref 3.5–5.1)
Sodium: 136 mmol/L (ref 135–145)

## 2016-03-17 MED ORDER — SODIUM CHLORIDE 0.9 % IV SOLN
INTRAVENOUS | Status: DC
  Administered 2016-03-17 – 2016-03-18 (×2): via INTRAVENOUS

## 2016-03-17 NOTE — Progress Notes (Signed)
65 year old female with endometrial cancer and a ileus. Patient has continued to have flatus.  She did have some increase in pain this AM but states improved now.  She has tolerated clear liquids well.  She states she feels she may be ready for home tomorrow.    Filed Vitals:   03/17/16 0539 03/17/16 1337  BP: 139/67 136/71  Pulse: 97 98  Temp: 98.4 F (36.9 C) 98.4 F (36.9 C)  Resp:  18   I/O last 3 completed shifts: In: 2851.8 [P.O.:280; I.V.:2571.8] Out: 3 [Urine:3] Total I/O In: 731 [P.O.:365; I.V.:366] Out: -     PE:  Gen NAD Res: CTAB/L  Cardio: RRRR Abd: soft, distension resolved, minimal tenderness in right side Ext: no edema   CBC Latest Ref Rng 03/16/2016 03/15/2016 03/09/2016  WBC 3.6 - 11.0 K/uL 9.0 11.2(H) 9.6  Hemoglobin 12.0 - 16.0 g/dL 7.9(L) 8.2(L) 8.4(L)  Hematocrit 35.0 - 47.0 % 24.6(L) 25.4(L) 25.8(L)  Platelets 150 - 440 K/uL 318 379 327   CMP Latest Ref Rng 03/17/2016 03/16/2016 03/15/2016  Glucose 65 - 99 mg/dL 75 76 149(H)  BUN 6 - 20 mg/dL 13 21(H) 31(H)  Creatinine 0.44 - 1.00 mg/dL 1.07(H) 1.14(H) 1.42(H)  Sodium 135 - 145 mmol/L 136 132(L) 131(L)  Potassium 3.5 - 5.1 mmol/L 4.6 3.4(L) 4.2  Chloride 101 - 111 mmol/L 108 102 102  CO2 22 - 32 mmol/L 22 21(L) 19(L)  Calcium 8.9 - 10.3 mg/dL 8.8(L) 8.8(L) 9.0  Total Protein 6.5 - 8.1 g/dL - - 7.3  Total Bilirubin 0.3 - 1.2 mg/dL - - 0.7  Alkaline Phos 38 - 126 U/L - - 97  AST 15 - 41 U/L - - 16  ALT 14 - 54 U/L - - 18     A/P:  65 year old female with endometrial cancer and now with an ileus. Patient seems to be resolving her ileus.  Can likely advance diet to full liquids tonight and then try soft diet in AM. No need for surgical intervention.

## 2016-03-17 NOTE — Progress Notes (Signed)
[facility] [place] NAME: [name] Graham    MR#:  [id number]  DATE OF BIRTH:  1951-03-23  SUBJECTIVE:  CHIEF COMPLAINT:   Chief Complaint  Patient presents with  . Abdominal Pain   Abdominal pain and nausea worsen today.  No BM. REVIEW OF SYSTEMS:  CONSTITUTIONAL: No fever, fatigue or weakness.  EYES: No blurred or double vision.  EARS, NOSE, AND THROAT: No tinnitus or ear pain.  RESPIRATORY: No cough, shortness of breath, wheezing or hemoptysis.  CARDIOVASCULAR: No chest pain, orthopnea, edema.  GASTROINTESTINAL: has abdominal pain and nausea, no vomiting or diarrhea.  GENITOURINARY: No dysuria, hematuria.  ENDOCRINE: No polyuria, nocturia,  HEMATOLOGY: No anemia, easy bruising or bleeding SKIN: No rash or lesion. MUSCULOSKELETAL: No joint pain or arthritis.   NEUROLOGIC: No tingling, numbness, weakness.  PSYCHIATRY: No anxiety or depression.   DRUG ALLERGIES:   Allergies  Allergen Reactions  . Penicillins Rash and Other (See Comments)     Has patient had a PCN reaction causing immediate rash, facial/tongue/throat swelling, SOB or lightheadedness with hypotension: No Has patient had a PCN reaction causing severe rash involving mucus membranes or skin necrosis: No Has patient had a PCN reaction that required hospitalization No Has patient had a PCN reaction occurring within the last 10 years: No If all of the above answers are "NO", then may proceed with Cephalosporin use.    VITALS:  Blood pressure 139/67, pulse 97, temperature 98.4 F (36.9 C), temperature source Oral, resp. rate 19, height 5\' 3"  (1.6 m), weight 53.978 kg (119 lb), SpO2 97 %.  PHYSICAL EXAMINATION:  GENERAL:  65 y.o.-year-old patient lying in the bed with no acute distress.  EYES: Pupils equal, round, reactive to light and accommodation. No scleral icterus. Extraocular muscles intact.  HEENT: Head atraumatic, normocephalic. Oropharynx and nasopharynx  clear.  NECK:  Supple, no jugular venous distention. No thyroid enlargement, no tenderness.  LUNGS: Normal breath sounds bilaterally, no wheezing, rales,rhonchi or crepitation. No use of accessory muscles of respiration.  CARDIOVASCULAR: S1, S2 normal. No murmurs, rubs, or gallops.  ABDOMEN: Soft, mild tenderness on RLQ, nondistended. Bowel sounds present. No organomegaly or mass.  EXTREMITIES: No pedal edema, cyanosis, or clubbing.  NEUROLOGIC: Cranial nerves II through XII are intact. Muscle strength 5/5 in all extremities. Sensation intact. Gait not checked.  PSYCHIATRIC: The patient is alert and oriented x 3.  SKIN: No obvious rash, lesion, or ulcer.    LABORATORY PANEL:   CBC  Recent Labs Lab 03/16/16 0417  WBC 9.0  HGB 7.9*  HCT 24.6*  PLT 318   ------------------------------------------------------------------------------------------------------------------  Chemistries   Recent Labs Lab 03/15/16 1327 03/16/16 0414  03/17/16 0435  NA 131*  --   < > 136  K 4.2  --   < > 4.6  CL 102  --   < > 108  CO2 19*  --   < > 22  GLUCOSE 149*  --   < > 75  BUN 31*  --   < > 13  CREATININE 1.42*  --   < > 1.07*  CALCIUM 9.0  --   < > 8.8*  MG  --  2.1  --   --   AST 16  --   --   --   ALT 18  --   --   --   ALKPHOS 97  --   --   --   BILITOT 0.7  --   --   --   < > =  values in this interval not displayed. ------------------------------------------------------------------------------------------------------------------  Cardiac Enzymes No results for input(s): TROPONINI in the last 168 hours. ------------------------------------------------------------------------------------------------------------------  RADIOLOGY:  Ct Abdomen Pelvis W Contrast  03/15/2016  CLINICAL DATA:  Generalized abdominal pain radiating to the back. Endometrial cancer. EXAM: CT ABDOMEN AND PELVIS WITH CONTRAST TECHNIQUE: Multidetector CT imaging of the abdomen and pelvis was performed using the  standard protocol following bolus administration of intravenous contrast. CONTRAST:  37mL ISOVUE-300 IOPAMIDOL (ISOVUE-300) INJECTION 61% COMPARISON:  03/09/2016 CT abdomen/ pelvis and 03/11/2016 abdominal radiographs. FINDINGS: Lower chest: Subpleural 4 mm medial left lower lobe pulmonary nodule (series 5/image 4), stable since 04/03/2013 and probably benign. Hepatobiliary: Normal liver with no liver mass. Contracted gallbladder with no radiopaque cholelithiasis. No biliary ductal dilatation. Pancreas: Normal, with no mass or duct dilation. Spleen: Normal size. No mass. Adrenals/Urinary Tract: Normal adrenals. No hydronephrosis. No renal mass. Collapsed and grossly normal bladder. Stomach/Bowel: Grossly normal stomach. There are mildly dilated small bowel loops throughout the abdomen, maximum diameter 3.2 cm in the pelvic small bowel. The terminal ileum is collapsed. Oral contrast progresses to the mid pelvic small bowel. No discrete small bowel caliber transition. No small bowel wall thickening. Appendix is not discretely visualized. No inflammatory changes in the right lower quadrant of the abdomen at the cecal base. No large bowel wall thickening or colonic diverticulosis. There is moderate diffuse distention of the large bowel and rectum by stool and gas. Vascular/Lymphatic: Atherosclerotic nonaneurysmal abdominal aorta. Stable extrinsic narrowing of the left renal vein by retroperitoneal adenopathy. Hepatic, portal, splenic and right renal veins appear patent. Confluent 6.8 x 5.5 cm retroperitoneal adenopathy encompassing the left para-aortic and aortocaval nodal chains with encasement of the abdominal aorta and partial encasement of the IVC (series 2/image 28), previously 6.6 x 5.8 cm, not appreciably changed. Reproductive: Status post hysterectomy, with no abnormal findings at the vaginal cuff. No adnexal mass. Other: No pneumoperitoneum, ascites or focal fluid collection. Musculoskeletal: Stable mixed lytic  and sclerotic osseous lesion in the anterior L2 vertebral body. No new focal osseous lesions. IMPRESSION: 1. Mildly dilated small bowel loops to the mid pelvic small bowel with collapsed terminal ileum. Oral contrast progresses only to the mid pelvic small bowel. No discrete small bowel caliber transition. Given the moderate diffuse large bowel distention by stool and gas, diffuse adynamic ileus is favored, although a partial distal small bowel obstruction cannot be excluded. Consider follow-up abdominal radiographs to evaluate for passage of oral contrast. 2. Stable bulky confluent retroperitoneal adenopathy encasing the abdominal aorta, in keeping with nodal metastases. 3. Stable mixed lytic and sclerotic osseous lesion in the anterior L2 vertebral body, suspicious for bone metastasis. Electronically Signed   By: Ilona Sorrel M.D.   On: 03/15/2016 17:43    EKG:   Orders placed or performed during the hospital encounter of 12/26/15  . ED EKG  . ED EKG    ASSESSMENT AND PLAN:   65 year old female with past medical history of uterine cancer with recurrence of presents to the hospital with abdominal pain, nausea.  1. Ileus/partial small bowel obstruction. Improving, continue IV fluids, antiemetics. She had BM with lactulose. She could not tolerate soft diet with worsening abdominal pain and nausea. Change to clear liquid diet.  2. Uterine cancer with recurrence-patient has evidence of bony metastases on the CT scan and also retroperitoneal adenopathy. -Continue follow-up with the cancer Center. -Continue fentanyl patch, Remeron, Megace.  3. Depression-continue Zoloft, Elavil  4. GERD-continue Protonix.  * Hyponatremia. Improved with NS  iv. * Hypokalemia. Given potassium and improved. *Anemia of chronic disease. Stable.  All the records are reviewed and case discussed with Care Management/Social Workerr. Management plans discussed with the patient, family and they are in  agreement.  CODE STATUS: full code.  TOTAL TIME TAKING CARE OF THIS PATIENT: 35 minutes.  Greater than 50% time was spent on coordination of care and face-to-face counseling.  POSSIBLE D/C IN 2 DAYS, DEPENDING ON CLINICAL CONDITION.   Demetrios Loll M.D on 03/17/2016 at 12:01 PM  Between 7am to 6pm - Pager - [phone]  After 6pm go to [email or web address] - password [email or web address] [facility]  Office  [phone]  CC: Primary care physician; [name], MD

## 2016-03-17 NOTE — Discharge Instructions (Signed)
Heart healthy diet. °Activity as tolerated. °

## 2016-03-17 NOTE — Care Management (Signed)
Per care team members- there are no discharge needs.

## 2016-03-17 NOTE — Care Management Important Message (Signed)
Important Message  Patient Details  Name: [name] Graham MRN: [id number] Date of Birth: 23-Dec-1950   Medicare Important Message Given:  Yes    Juliann Pulse A [facility] Ahlgren 03/17/2016, 12:30 PM

## 2016-03-18 ENCOUNTER — Other Ambulatory Visit: Payer: Self-pay | Admitting: Oncology

## 2016-03-18 NOTE — Discharge Summary (Signed)
[facility] NAME: [name] Graham    MR#:  [id number]  DATE OF BIRTH:  02-19-1951  DATE OF ADMISSION:  03/15/2016 ADMITTING PHYSICIAN: Henreitta Leber, MD  DATE OF DISCHARGE: 03/18/2016 PRIMARY CARE PHYSICIAN: [name], MD    ADMISSION DIAGNOSIS:  Ileus ([place]) [K56.7] Right lower quadrant abdominal pain [R10.31]   DISCHARGE DIAGNOSIS:   Ileus/partial small bowel obstruction.  SECONDARY DIAGNOSIS:   Past Medical History  Diagnosis Date  . Cancer ([place]) 2012  . GI problem 2013  . Cancer of uterus ([place]) 04/18/2015  . Uterine cancer ([place])   . Wears hearing aid     bilateral    HOSPITAL COURSE:   65 year old female with past medical history of uterine cancer with recurrence of presents to the hospital with abdominal pain, nausea.  1. Ileus/partial small bowel obstruction. Improved, she was treated with IV fluids and antiemetics. She had BM with lactulose. She tolerated soft diet.  2. Uterine cancer with recurrence-patient has evidence of bony metastases on the CT scan and also retroperitoneal adenopathy. -Continue follow-up with the cancer Center. -Continue fentanyl patch, Remeron, Megace.  3. Depression-continue Zoloft, Elavil  4. GERD-continue Protonix.  * Hyponatremia. Improved with NS iv. * Hypokalemia. Improved with potassium.. *Anemia of chronic disease. Stable.  I discussed with Dr. Ronalee Belts. DISCHARGE CONDITIONS:   Stable, discharge to home today.  CONSULTS OBTAINED:  Treatment Team:  Dia Crawford III, MD  DRUG ALLERGIES:   Allergies  Allergen Reactions  . Penicillins Rash and Other (See Comments)     Has patient had a PCN reaction causing immediate rash, facial/tongue/throat swelling, SOB or lightheadedness with hypotension: No Has patient had a PCN reaction causing severe rash involving mucus membranes or skin necrosis: No Has patient had a PCN reaction that required hospitalization  No Has patient had a PCN reaction occurring within the last 10 years: No If all of the above answers are "NO", then may proceed with Cephalosporin use.    DISCHARGE MEDICATIONS:   Current Discharge Medication List    CONTINUE these medications which have NOT CHANGED   Details  acetaminophen (TYLENOL) 500 MG tablet Take 1,000 mg by mouth every 6 (six) hours as needed for mild pain or headache.     ALPRAZolam (XANAX) 0.5 MG tablet Take 1 tablet (0.5 mg total) by mouth 2 (two) times daily. Qty: 60 tablet, Refills: 3   Associated Diagnoses: Malignant neoplasm of endometrium (HCC)    amitriptyline (ELAVIL) 25 MG tablet Take 1 tablet (25 mg total) by mouth at bedtime. Qty: 30 tablet, Refills: 3   Associated Diagnoses: Malignant neoplasm of endometrium ([place]); Malignant neoplasm of uterus, unspecified site (HCC)    cetirizine (ZYRTEC) 10 MG tablet Take 10 mg by mouth daily as needed for allergies.     docusate sodium (COLACE) 100 MG capsule Take 200 mg by mouth 2 (two) times daily as needed for mild constipation.     fentaNYL (DURAGESIC - DOSED MCG/HR) 12 MCG/HR Place 1 patch (12.5 mcg total) onto the skin every 3 (three) days. Use with 28mcg patch for total dose of 37.51mcg. Qty: 10 patch, Refills: 0   Associated Diagnoses: Malignant neoplasm of uterus, unspecified site ([place]); Malignant neoplasm of endometrium (HCC)    fentaNYL (DURAGESIC - DOSED MCG/HR) 25 MCG/HR patch Place 1 patch (25 mcg total) onto the skin every 3 (three) days. Use with 12.40mcg patch for total dose 37.27mcg. Qty: 10 patch, Refills: 0   Associated  Diagnoses: Malignant neoplasm of uterus, unspecified site [facility]); Malignant neoplasm of endometrium (HCC)    fentaNYL (DURAGESIC - DOSED MCG/HR) 50 MCG/HR Place 50 mcg onto the skin every 3 (three) days.    fluticasone (FLONASE) 50 MCG/ACT nasal spray Place 1-2 sprays into both nostrils daily as needed for rhinitis.     HYDROcodone-acetaminophen (NORCO/VICODIN) 5-325 MG  tablet Take 1 tablet by mouth every 6 (six) hours as needed for moderate pain. Qty: 60 tablet, Refills: 0   Associated Diagnoses: Malignant neoplasm of endometrium ([place]); Malignant neoplasm of uterus, unspecified site [facility]); Hip pain, right    lactulose (CHRONULAC) 10 GM/15ML solution Take 30 g by mouth 2 (two) times daily as needed for severe constipation.    megestrol (MEGACE) 400 MG/10ML suspension Take 10 mLs (400 mg total) by mouth 2 (two) times daily. Qty: 480 mL, Refills: 3   Associated Diagnoses: Malignant neoplasm of endometrium ([place]); Malignant neoplasm of uterus, unspecified site [facility]); Hip pain, right    metoCLOPramide (REGLAN) 10 MG tablet Take 1 tablet (10 mg total) by mouth 4 (four) times daily as needed for nausea. Qty: 60 tablet, Refills: 3    mirtazapine (REMERON) 15 MG tablet Take 15 mg by mouth at bedtime.  Refills: 3   Associated Diagnoses: Malignant neoplasm of uterus, unspecified site [facility]); Malignant neoplasm of endometrium (HCC)    omeprazole (PRILOSEC) 20 MG capsule Take 20 mg by mouth 2 (two) times daily before a meal.    ondansetron (ZOFRAN) 4 MG tablet Take 1 tablet (4 mg total) by mouth every 6 (six) hours as needed for nausea or vomiting. Qty: 60 tablet, Refills: 3   Associated Diagnoses: Malignant neoplasm of uterus, unspecified site (HCC)    polyethylene glycol (MIRALAX / GLYCOLAX) packet Take 17 g by mouth at bedtime.     potassium chloride (K-DUR,KLOR-CON) 10 MEQ tablet Take 10 mEq by mouth daily.    predniSONE (DELTASONE) 20 MG tablet Take 1 tablet (20 mg total) by mouth daily with breakfast. Qty: 30 tablet, Refills: 3   Associated Diagnoses: Malignant neoplasm of endometrium (HCC)    sertraline (ZOLOFT) 100 MG tablet Take 100 mg by mouth daily.         DISCHARGE INSTRUCTIONS:   If you experience worsening of your admission symptoms, develop shortness of breath, life threatening emergency, suicidal or homicidal thoughts you must seek medical  attention immediately by calling 911 or calling your MD immediately  if symptoms less severe.  You Must read complete instructions/literature along with all the possible adverse reactions/side effects for all the Medicines you take and that have been prescribed to you. Take any new Medicines after you have completely understood and accept all the possible adverse reactions/side effects.   Please note  You were cared for by a hospitalist during your hospital stay. If you have any questions about your discharge medications or the care you received while you were in the hospital after you are discharged, you can call the unit and asked to speak with the hospitalist on call if the hospitalist that took care of you is not available. Once you are discharged, your primary care physician will handle any further medical issues. Please note that NO REFILLS for any discharge medications will be authorized once you are discharged, as it is imperative that you return to your primary care physician (or establish a relationship with a primary care physician if you do not have one) for your aftercare needs so that they can reassess your need for  medications and monitor your lab values.    Today   SUBJECTIVE   No complaint.   VITAL SIGNS:  Blood pressure 135/91, pulse 111, temperature 98.7 F (37.1 C), temperature source Oral, resp. rate 18, height 5\' 3"  (1.6 m), weight 53.978 kg (119 lb), SpO2 97 %.  I/O:   Intake/Output Summary (Last 24 hours) at 03/18/16 1306 Last data filed at 03/18/16 1147  Gross per 24 hour  Intake 3139.9 ml  Output   1950 ml  Net 1189.9 ml    PHYSICAL EXAMINATION:  GENERAL:  66 y.o.-year-old patient lying in the bed with no acute distress.  EYES: Pupils equal, round, reactive to light and accommodation. No scleral icterus. Extraocular muscles intact.  HEENT: Head atraumatic, normocephalic. Oropharynx and nasopharynx clear.  NECK:  Supple, no jugular venous distention. No  thyroid enlargement, no tenderness.  LUNGS: Normal breath sounds bilaterally, no wheezing, rales,rhonchi or crepitation. No use of accessory muscles of respiration.  CARDIOVASCULAR: S1, S2 normal. No murmurs, rubs, or gallops.  ABDOMEN: Soft, non-tender, non-distended. Bowel sounds present. No organomegaly or mass.  EXTREMITIES: No pedal edema, cyanosis, or clubbing.  NEUROLOGIC: Cranial nerves II through XII are intact. Muscle strength 5/5 in all extremities. Sensation intact. Gait not checked.  PSYCHIATRIC: The patient is alert and oriented x 3.  SKIN: No obvious rash, lesion, or ulcer.   DATA REVIEW:   CBC  Recent Labs Lab 03/16/16 0417  WBC 9.0  HGB 7.9*  HCT 24.6*  PLT 318    Chemistries   Recent Labs Lab 03/15/16 1327 03/16/16 0414  03/17/16 0435  NA 131*  --   < > 136  K 4.2  --   < > 4.6  CL 102  --   < > 108  CO2 19*  --   < > 22  GLUCOSE 149*  --   < > 75  BUN 31*  --   < > 13  CREATININE 1.42*  --   < > 1.07*  CALCIUM 9.0  --   < > 8.8*  MG  --  2.1  --   --   AST 16  --   --   --   ALT 18  --   --   --   ALKPHOS 97  --   --   --   BILITOT 0.7  --   --   --   < > = values in this interval not displayed.  Cardiac Enzymes No results for input(s): TROPONINI in the last 168 hours.  Microbiology Results  Results for orders placed or performed in visit on 11/22/14  Culture, blood (single)     Status: None   Collection Time: 11/22/14  3:00 PM  Result Value Ref Range Status   Micro Text Report   Final       COMMENT                   NO GROWTH AEROBICALLY/ANAEROBICALLY IN 5 DAYS   ANTIBIOTIC                                                      Culture, blood (single)     Status: None   Collection Time: 11/22/14  3:10 PM  Result Value Ref Range Status   Micro Text Report   Final  COMMENT                   NO GROWTH AEROBICALLY/ANAEROBICALLY IN 5 DAYS   ANTIBIOTIC                                                      Urine culture     Status:  None   Collection Time: 11/22/14  6:46 PM  Result Value Ref Range Status   Micro Text Report   Final       SOURCE: CLEAN CATCH    ORGANISM 1                1000 CFU/ML GRAM POSITIVE ROD   COMMENT                   -   ANTIBIOTIC                                                      Stool culture     Status: None   Collection Time: 11/25/14 12:46 PM  Result Value Ref Range Status   Micro Text Report   Final       COMMENT                   NO SALMONELLA OR SHIGELLA ISOLATED   COMMENT                   NO PATHOGENIC E.COLI DETECTED   COMMENT                   NO CAMPYLOBACTER ANTIGEN DETECTED   ANTIBIOTIC                                                        RADIOLOGY:  No results found.      Management plans discussed with the patient, family and they are in agreement.  CODE STATUS:     Code Status Orders        Start     Ordered   03/15/16 2205  Full code   Continuous     03/15/16 2204    Code Status History    Date Active Date Inactive Code Status Order ID Comments User Context   12/26/2015  9:10 PM 12/28/2015  5:00 PM Full Code [id number]  Hillary Bow, MD ED   12/22/2015  1:21 AM 12/24/2015  2:33 PM Full Code [id number]  Harrie Foreman, MD Inpatient    Advance Directive Documentation        Most Recent Value   Type of Advance Directive  Healthcare Power of Attorney   Pre-existing out of facility DNR order (yellow form or pink MOST form)     "MOST" Form in Place?        TOTAL TIME TAKING CARE OF THIS PATIENT: 32 minutes.    Demetrios Loll M.D on 03/18/2016 at 1:06 PM  Between 7am to 6pm - Pager - [phone]  After 6pm go to [email or web address] -  password [email or web address] [facility]  [facility]  Office  [phone]  CC: Primary care physician; [name], MD

## 2016-03-21 NOTE — Progress Notes (Signed)
[facility]  [phone]  [phone]     [name] Graham DOB: 28-Aug-1951  MR#: [id number]  MSX#:[id number]  [name], MD as PCP - General (Family Medicine) [name], MD (General Surgery)  CHIEF COMPLAINT:  Chief Complaint  Patient presents with  . Acute Visit  . Constipation    INTERVAL HISTORY:   Patient returns to clinic today as an add-on for further evaluation of her persistent abdominal pain. Recent CT scan only revealed significant constipation. Patient was prescribed lactulose, but felt like this caused her too much diarrhea and discontinued.  Abdominal x-ray yesterday did not reveal obstruction. Patient continues to feel weak and fatigued. She denies any nausea or vomiting, but attributes this to poor oral intake. She denies any fevers or illnesses. She denies any chest pain or shortness of breath. Patient feels generally terrible, but offers no further specific complaints.   REVIEW OF SYSTEMS:   Review of Systems  Constitutional: Positive for weight loss and malaise/fatigue. Negative for fever.  Respiratory: Negative.  Negative for shortness of breath.   Cardiovascular: Negative.  Negative for chest pain.  Gastrointestinal: Positive for abdominal pain and constipation. Negative for nausea, vomiting, blood in stool and melena.  Genitourinary: Negative.   Musculoskeletal: Negative.   Neurological: Positive for weakness.  Psychiatric/Behavioral: Positive for depression.    As per HPI. Otherwise, a complete review of systems is negatve.  ONCOLOGY HISTORY: Oncology History   Chief Complaint/Diagnosis:   The patient is a 65 year old with metastatic endometrial cancer who is seen for assessment during ongoing palliative radiation therapy.  04/2011   papillary serous adenocarcinoma of the endometrium, stage IIIb (nodal metastasis)              TAHBSO and full staging,               GOG protocl using  XRT and chemotherapy, significant XRT side-effects,               continued on chemotherapy off protocol, 04/2013   recurrence in the nodal areas,              DDP and docetaxel x 6 completed in December,   started on chemotherapy with Adriamycin(October, 2015) poor tolerance to letrozole  and affinator Estrogen and progesterone receptor negative tumor         hER-2/neu recetor negative. November 07, 2014 progressive disease by  tumor marker criteria and clinical examination November 12, 2014 After 6 cycles of chemotherapy because of increasing side effect chemotherapy was discontinued.  (March, 2016) Patient was started on cis-platinum and topotecan. May, 2016 Cis-platinum into [place] and was discontinued because of significant side effect and possibility of progressive disease.  Patient would be started on NIVOLULAMAB on compassionate use basis  Nivolumab was discontinued in December 2016 due to progressing disease.     Cancer of uterus ([place])   04/18/2015 Initial Diagnosis Cancer of uterus    PAST MEDICAL HISTORY: Past Medical History  Diagnosis Date  . Cancer ([place]) 2012  . GI problem 2013  . Cancer of uterus ([place]) 04/18/2015  . Uterine cancer ([place])   . Wears hearing aid     bilateral    PAST SURGICAL HISTORY: Past Surgical History  Procedure Laterality Date  . Abdominal hysterectomy    . Tonsillectomy    . Cataract extraction w/phaco Left 02/11/2016    Procedure: CATARACT EXTRACTION PHACO AND INTRAOCULAR LENS PLACEMENT (IOC);  Surgeon: Leandrew Koyanagi, MD;  Location: [facility];  Service: Ophthalmology;  Laterality: Left;    FAMILY HISTORY Family History  Problem Relation Age of Onset  . Hypertension Other   . Colon cancer Mother     GYNECOLOGIC HISTORY:  No LMP recorded. Patient has had a hysterectomy.     ADVANCED DIRECTIVES:    HEALTH MAINTENANCE: Social History  Substance Use Topics  . Smoking status: Never Smoker   . Smokeless tobacco: Never  Used  . Alcohol Use: 0.0 oz/week    0 Standard drinks or equivalent per week     Colonoscopy:  PAP:  Bone density:  Mammogram:  Allergies  Allergen Reactions  . Penicillins Rash and Other (See Comments)     Has patient had a PCN reaction causing immediate rash, facial/tongue/throat swelling, SOB or lightheadedness with hypotension: No Has patient had a PCN reaction causing severe rash involving mucus membranes or skin necrosis: No Has patient had a PCN reaction that required hospitalization No Has patient had a PCN reaction occurring within the last 10 years: No If all of the above answers are "NO", then may proceed with Cephalosporin use.    Current Outpatient Prescriptions  Medication Sig Dispense Refill  . acetaminophen (TYLENOL) 500 MG tablet Take 1,000 mg by mouth every 6 (six) hours as needed for mild pain or headache.     . ALPRAZolam (XANAX) 0.5 MG tablet Take 1 tablet (0.5 mg total) by mouth 2 (two) times daily. 60 tablet 3  . docusate sodium (COLACE) 100 MG capsule Take 200 mg by mouth 2 (two) times daily as needed for mild constipation.     . fentaNYL (DURAGESIC - DOSED MCG/HR) 12 MCG/HR Place 1 patch (12.5 mcg total) onto the skin every 3 (three) days. Use with 68mg patch for total dose of 37.533m. 10 patch 0  . fentaNYL (DURAGESIC - DOSED MCG/HR) 25 MCG/HR patch Place 1 patch (25 mcg total) onto the skin every 3 (three) days. Use with 12.62m87mpatch for total dose 37.62mc362m10 patch 0  . fluticasone (FLONASE) 50 MCG/ACT nasal spray Place 1-2 sprays into both nostrils daily as needed for rhinitis.     . HYMarland KitchenROcodone-acetaminophen (NORCO/VICODIN) 5-325 MG tablet Take 1 tablet by mouth every 6 (six) hours as needed for moderate pain. 60 tablet 0  . megestrol (MEGACE) 400 MG/10ML suspension Take 10 mLs (400 mg total) by mouth 2 (two) times daily. 480 mL 3  . metoCLOPramide (REGLAN) 10 MG tablet Take 1 tablet (10 mg total) by mouth 4 (four) times daily as needed for nausea. 60  tablet 3  . mirtazapine (REMERON) 15 MG tablet Take 15 mg by mouth at bedtime.   3  . ondansetron (ZOFRAN) 4 MG tablet Take 1 tablet (4 mg total) by mouth every 6 (six) hours as needed for nausea or vomiting. 60 tablet 3  . predniSONE (DELTASONE) 20 MG tablet Take 1 tablet (20 mg total) by mouth daily with breakfast. 30 tablet 3  . amitriptyline (ELAVIL) 25 MG tablet TAKE 1 TABLET(25 MG) BY MOUTH AT BEDTIME 30 tablet 0  . cetirizine (ZYRTEC) 10 MG tablet Take 10 mg by mouth daily as needed for allergies.     . fentaNYL (DURAGESIC - DOSED MCG/HR) 50 MCG/HR Place 50 mcg onto the skin every 3 (three) days.    . laMarland Kitchentulose (CHRONULAC) 10 GM/15ML solution Take 30 g by mouth 2 (two) times daily as needed for severe constipation.    . omMarland Kitchenprazole (PRILOSEC) 20 MG capsule Take 20 mg by  mouth 2 (two) times daily before a meal.    . polyethylene glycol (MIRALAX / GLYCOLAX) packet Take 17 g by mouth at bedtime.     . potassium chloride (K-DUR,KLOR-CON) 10 MEQ tablet Take 10 mEq by mouth daily.    . sertraline (ZOLOFT) 100 MG tablet Take 100 mg by mouth daily.     Current Facility-Administered Medications  Medication Dose Route Frequency Provider Last Rate Last Dose  . 0.9 %  sodium chloride infusion   Intravenous Once Lloyd Huger, MD       Facility-Administered Medications Ordered in Other Visits  Medication Dose Route Frequency Provider Last Rate Last Dose  . 0.9 %  sodium chloride infusion   Intravenous Continuous Forest Gleason, MD 10 mL/hr at 11/04/15 1415    . sodium chloride 0.9 % injection 10 mL  10 mL Intracatheter PRN Forest Gleason, MD   10 mL at 05/06/15 1028  . sodium chloride 0.9 % injection 10 mL  10 mL Intravenous PRN Forest Gleason, MD      . sodium chloride 0.9 % injection 10 mL  10 mL Intravenous PRN Forest Gleason, MD        OBJECTIVE: BP 125/81 mmHg  Pulse 108  Temp(Src) 97.2 F (36.2 C) (Tympanic)  Resp 16  Wt 119 lb 11.4 oz (54.3 kg)   Body mass index is 21.21 kg/(m^2).    ECOG  FS:1 - Symptomatic but completely ambulatory  General: Well-developed, well-nourished, no acute distress. Eyes: Pink conjunctiva, anicteric sclera. Lungs: Clear to auscultation bilaterally. Heart: Regular rate and rhythm. No rubs, murmurs, or gallops. Abdomen: Soft, nontender. No organomegaly noted, normoactive bowel sounds. Musculoskeletal: No edema, cyanosis, or clubbing. Neuro: Alert, answering all questions appropriately. Cranial nerves grossly intact. Skin: No rashes or petechiae noted. Psych: Normal affect.   LAB RESULTS:  No visits with results within 3 Day(s) from this visit. Latest known visit with results is:  Appointment on 03/09/2016  Component Date Value Ref Range Status  . WBC 03/09/2016 9.6  3.6 - 11.0 K/uL Final  . RBC 03/09/2016 2.87* 3.80 - 5.20 MIL/uL Final  . Hemoglobin 03/09/2016 8.4* 12.0 - 16.0 g/dL Final  . HCT 03/09/2016 25.8* 35.0 - 47.0 % Final  . MCV 03/09/2016 90.0  80.0 - 100.0 fL Final  . MCH 03/09/2016 29.3  26.0 - 34.0 pg Final  . MCHC 03/09/2016 32.5  32.0 - 36.0 g/dL Final  . RDW 03/09/2016 18.4* 11.5 - 14.5 % Final  . Platelets 03/09/2016 327  150 - 440 K/uL Final  . Neutrophils Relative % 03/09/2016 87   Final  . Neutro Abs 03/09/2016 8.3* 1.4 - 6.5 K/uL Final  . Lymphocytes Relative 03/09/2016 9   Final  . Lymphs Abs 03/09/2016 0.8* 1.0 - 3.6 K/uL Final  . Monocytes Relative 03/09/2016 4   Final  . Monocytes Absolute 03/09/2016 0.4  0.2 - 0.9 K/uL Final  . Eosinophils Relative 03/09/2016 0   Final  . Eosinophils Absolute 03/09/2016 0.0  0 - 0.7 K/uL Final  . Basophils Relative 03/09/2016 0   Final  . Basophils Absolute 03/09/2016 0.0  0 - 0.1 K/uL Final  . Sodium 03/09/2016 132* 135 - 145 mmol/L Final  . Potassium 03/09/2016 4.1  3.5 - 5.1 mmol/L Final  . Chloride 03/09/2016 102  101 - 111 mmol/L Final  . CO2 03/09/2016 22  22 - 32 mmol/L Final  . Glucose, Bld 03/09/2016 116* 65 - 99 mg/dL Final  . BUN 03/09/2016 38* 6 -  20 mg/dL Final   . Creatinine, Ser 03/09/2016 1.29* 0.44 - 1.00 mg/dL Final  . Calcium 03/09/2016 8.7* 8.9 - 10.3 mg/dL Final  . Total Protein 03/09/2016 7.0  6.5 - 8.1 g/dL Final  . Albumin 03/09/2016 2.8* 3.5 - 5.0 g/dL Final  . AST 03/09/2016 13* 15 - 41 U/L Final  . ALT 03/09/2016 24  14 - 54 U/L Final  . Alkaline Phosphatase 03/09/2016 97  38 - 126 U/L Final  . Total Bilirubin 03/09/2016 0.4  0.3 - 1.2 mg/dL Final  . GFR calc non Af Amer 03/09/2016 42* >60 mL/min Final  . GFR calc Af Amer 03/09/2016 49* >60 mL/min Final   Comment: (NOTE) The eGFR has been calculated using the CKD EPI equation. This calculation has not been validated in all clinical situations. eGFR's persistently <60 mL/min signify possible Chronic Kidney Disease.   . Anion gap 03/09/2016 8  5 - 15 Final  . Magnesium 03/09/2016 2.0  1.7 - 2.4 mg/dL Final    STUDIES: No results found.  ASSESSMENT:   Abdominal pain, likely secondary to significant constipation.  PLAN:   1. Abdominal pain: CT scan results reviewed independently.  Abdominal x-ray from yesterday did not reveal any obstruction, but there was concern for diffuse ileus. Patient was instructed to continue her current medications. She also has Reglan prescribed, but was not taking this medication. She has been instructed to take it as  Instructed. Return to clinic as previously scheduled. She also understands that if symptoms get worse, she will likely have to go to the ER for further evaluation.  2. Uterine cancer: CT scan revealed likely progressive disease , keep follow-up as previously scheduled.  Approximately 30 minutes was spent in discussion of which greater than 50% was consultation.  Patient expressed understanding and was in agreement with this plan. She also understands that She can call clinic at any time with any questions, concerns, or complaints.   Cancer of uterus [facility])   Staging form: Corpus Uteri - Carcinoma, AJCC 7th Edition     Clinical: Stage  IVB (T3b, N2, M1) - Signed by Forest Gleason, MD on 04/18/2015   Lloyd Huger, MD   03/21/2016 11:54 PM

## 2016-03-23 ENCOUNTER — Telehealth: Payer: Self-pay | Admitting: *Deleted

## 2016-03-23 ENCOUNTER — Inpatient Hospital Stay: Payer: Medicare Other

## 2016-03-23 ENCOUNTER — Other Ambulatory Visit: Payer: Self-pay | Admitting: *Deleted

## 2016-03-23 VITALS — BP 129/74 | HR 108 | Temp 98.3°F | Resp 20

## 2016-03-23 DIAGNOSIS — K5900 Constipation, unspecified: Secondary | ICD-10-CM | POA: Diagnosis not present

## 2016-03-23 DIAGNOSIS — C772 Secondary and unspecified malignant neoplasm of intra-abdominal lymph nodes: Secondary | ICD-10-CM | POA: Diagnosis not present

## 2016-03-23 DIAGNOSIS — R1011 Right upper quadrant pain: Secondary | ICD-10-CM | POA: Diagnosis not present

## 2016-03-23 DIAGNOSIS — Z9221 Personal history of antineoplastic chemotherapy: Secondary | ICD-10-CM | POA: Diagnosis not present

## 2016-03-23 DIAGNOSIS — C541 Malignant neoplasm of endometrium: Secondary | ICD-10-CM | POA: Diagnosis not present

## 2016-03-23 DIAGNOSIS — M545 Low back pain: Secondary | ICD-10-CM | POA: Diagnosis not present

## 2016-03-23 DIAGNOSIS — C55 Malignant neoplasm of uterus, part unspecified: Secondary | ICD-10-CM

## 2016-03-23 DIAGNOSIS — Z79899 Other long term (current) drug therapy: Secondary | ICD-10-CM | POA: Diagnosis not present

## 2016-03-23 DIAGNOSIS — M899 Disorder of bone, unspecified: Secondary | ICD-10-CM | POA: Diagnosis not present

## 2016-03-23 MED ORDER — ONDANSETRON HCL 4 MG/2ML IJ SOLN
INTRAMUSCULAR | Status: AC
  Filled 2016-03-23: qty 4

## 2016-03-23 MED ORDER — SODIUM CHLORIDE 0.9 % IV SOLN
Freq: Once | INTRAVENOUS | Status: AC
  Administered 2016-03-23: 14:00:00 via INTRAVENOUS
  Filled 2016-03-23: qty 4

## 2016-03-23 MED ORDER — DEXAMETHASONE SODIUM PHOSPHATE 10 MG/ML IJ SOLN
INTRAMUSCULAR | Status: AC
  Filled 2016-03-23: qty 1

## 2016-03-23 MED ORDER — SODIUM CHLORIDE 0.9 % IV SOLN
Freq: Once | INTRAVENOUS | Status: AC
  Administered 2016-03-23: 14:00:00 via INTRAVENOUS
  Filled 2016-03-23: qty 1000

## 2016-03-23 MED ORDER — HEPARIN SOD (PORK) LOCK FLUSH 100 UNIT/ML IV SOLN
500.0000 [IU] | Freq: Once | INTRAVENOUS | Status: AC | PRN
  Administered 2016-03-23: 500 [IU]
  Filled 2016-03-23: qty 5

## 2016-03-23 MED ORDER — SODIUM CHLORIDE 0.9 % IJ SOLN
10.0000 mL | INTRAMUSCULAR | Status: DC | PRN
  Administered 2016-03-23: 10 mL
  Filled 2016-03-23: qty 10

## 2016-03-23 NOTE — Telephone Encounter (Signed)
Called patient to advise her to go to [facility] today @ 1:30 for IVF's.  Verbalized understanding.

## 2016-03-23 NOTE — Telephone Encounter (Signed)
1 hour IVF today in [place].

## 2016-03-23 NOTE — Telephone Encounter (Signed)
Patient states she feels weak and like she needs fluids. Can she be added on schedule for fluids?

## 2016-03-24 ENCOUNTER — Telehealth: Payer: Self-pay | Admitting: *Deleted

## 2016-03-24 ENCOUNTER — Other Ambulatory Visit: Payer: Self-pay | Admitting: Oncology

## 2016-03-24 MED ORDER — LACTULOSE 10 GM/15ML PO SOLN: 30.0000 g | Freq: Two times a day (BID) | ORAL | Status: DC | PRN

## 2016-03-24 NOTE — Telephone Encounter (Signed)
Patient requesting refill for Lactulose. 

## 2016-03-24 NOTE — Telephone Encounter (Signed)
Refill sent in to pharmacy 

## 2016-03-24 NOTE — Telephone Encounter (Signed)
Called patient to inform her prescription has been called to pharmacy.

## 2016-03-25 ENCOUNTER — Inpatient Hospital Stay: Payer: Medicare Other

## 2016-03-25 ENCOUNTER — Inpatient Hospital Stay (HOSPITAL_BASED_OUTPATIENT_CLINIC_OR_DEPARTMENT_OTHER): Payer: Medicare Other | Admitting: Oncology

## 2016-03-25 ENCOUNTER — Other Ambulatory Visit: Payer: Self-pay | Admitting: *Deleted

## 2016-03-25 ENCOUNTER — Encounter: Payer: Self-pay | Admitting: Oncology

## 2016-03-25 VITALS — BP 138/80 | HR 112 | Temp 99.4°F | Resp 18 | Wt 117.5 lb

## 2016-03-25 DIAGNOSIS — C55 Malignant neoplasm of uterus, part unspecified: Secondary | ICD-10-CM

## 2016-03-25 DIAGNOSIS — M25551 Pain in right hip: Secondary | ICD-10-CM

## 2016-03-25 DIAGNOSIS — C541 Malignant neoplasm of endometrium: Secondary | ICD-10-CM

## 2016-03-25 DIAGNOSIS — C772 Secondary and unspecified malignant neoplasm of intra-abdominal lymph nodes: Secondary | ICD-10-CM | POA: Diagnosis not present

## 2016-03-25 DIAGNOSIS — Z79899 Other long term (current) drug therapy: Secondary | ICD-10-CM | POA: Diagnosis not present

## 2016-03-25 DIAGNOSIS — Z9221 Personal history of antineoplastic chemotherapy: Secondary | ICD-10-CM

## 2016-03-25 DIAGNOSIS — E860 Dehydration: Secondary | ICD-10-CM

## 2016-03-25 LAB — COMPREHENSIVE METABOLIC PANEL
ALT: 10 U/L — ABNORMAL LOW (ref 14–54)
AST: 14 U/L — ABNORMAL LOW (ref 15–41)
Albumin: 2.8 g/dL — ABNORMAL LOW (ref 3.5–5.0)
Alkaline Phosphatase: 86 U/L (ref 38–126)
Anion gap: 8 (ref 5–15)
BILIRUBIN TOTAL: 0.4 mg/dL (ref 0.3–1.2)
BUN: 22 mg/dL — ABNORMAL HIGH (ref 6–20)
CHLORIDE: 102 mmol/L (ref 101–111)
CO2: 20 mmol/L — ABNORMAL LOW (ref 22–32)
CREATININE: 1.27 mg/dL — AB (ref 0.44–1.00)
Calcium: 8.5 mg/dL — ABNORMAL LOW (ref 8.9–10.3)
GFR, EST AFRICAN AMERICAN: 49 mL/min — AB (ref >60)
GFR, EST NON AFRICAN AMERICAN: 43 mL/min — AB (ref >60)
Glucose, Bld: 110 mg/dL — ABNORMAL HIGH (ref 65–99)
POTASSIUM: 4.7 mmol/L (ref 3.5–5.1)
Sodium: 130 mmol/L — ABNORMAL LOW (ref 135–145)
TOTAL PROTEIN: 7 g/dL (ref 6.5–8.1)

## 2016-03-25 LAB — CBC WITH DIFFERENTIAL/PLATELET
BASOS ABS: 0 10*3/uL (ref 0–0.1)
BASOS PCT: 0 %
EOS PCT: 0 %
Eosinophils Absolute: 0 10*3/uL (ref 0–0.7)
HCT: 22.5 % — ABNORMAL LOW (ref 35.0–47.0)
Hemoglobin: 7.5 g/dL — ABNORMAL LOW (ref 12.0–16.0)
LYMPHS PCT: 3 %
Lymphs Abs: 0.3 10*3/uL — ABNORMAL LOW (ref 1.0–3.6)
MCH: 30 pg (ref 26.0–34.0)
MCHC: 33.5 g/dL (ref 32.0–36.0)
MCV: 89.6 fL (ref 80.0–100.0)
MONO ABS: 0.3 10*3/uL (ref 0.2–0.9)
Monocytes Relative: 4 %
NEUTROS ABS: 9.1 10*3/uL — AB (ref 1.4–6.5)
Neutrophils Relative %: 93 %
PLATELETS: 400 10*3/uL (ref 150–440)
RBC: 2.51 MIL/uL — AB (ref 3.80–5.20)
RDW: 16.2 % — AB (ref 11.5–14.5)
WBC: 9.8 10*3/uL (ref 3.6–11.0)

## 2016-03-25 LAB — PREPARE RBC (CROSSMATCH)

## 2016-03-25 LAB — MAGNESIUM: MAGNESIUM: 2 mg/dL (ref 1.7–2.4)

## 2016-03-25 MED ORDER — HYDROCODONE-ACETAMINOPHEN 5-325 MG PO TABS: 1.0000 | ORAL_TABLET | Freq: Four times a day (QID) | ORAL | Status: DC | PRN

## 2016-03-25 MED ORDER — SODIUM CHLORIDE 0.9 % IV SOLN
Freq: Once | INTRAVENOUS | Status: AC
  Administered 2016-03-25: 16:00:00 via INTRAVENOUS
  Filled 2016-03-25: qty 2

## 2016-03-25 MED ORDER — FENTANYL 50 MCG/HR TD PT72: 50.0000 ug | MEDICATED_PATCH | TRANSDERMAL | Status: DC

## 2016-03-25 MED ORDER — SODIUM CHLORIDE 0.9 % IV SOLN
Freq: Once | INTRAVENOUS | Status: AC
  Administered 2016-03-25: 15:00:00 via INTRAVENOUS
  Filled 2016-03-25: qty 1000

## 2016-03-25 MED ORDER — HEPARIN SOD (PORK) LOCK FLUSH 100 UNIT/ML IV SOLN
500.0000 [IU] | Freq: Once | INTRAVENOUS | Status: AC | PRN
  Administered 2016-03-25: 500 [IU]
  Filled 2016-03-25: qty 5

## 2016-03-25 NOTE — Progress Notes (Signed)
Patient states she has not felt good since being discharged from hospital.  States she is weak.  BP 138/80  HR 112.  Patient continues to have constipation.  Taking lactulose.

## 2016-03-26 ENCOUNTER — Inpatient Hospital Stay: Payer: Medicare Other

## 2016-03-26 ENCOUNTER — Encounter: Payer: Self-pay | Admitting: Oncology

## 2016-03-26 VITALS — BP 131/77 | HR 96 | Temp 98.4°F

## 2016-03-26 DIAGNOSIS — C55 Malignant neoplasm of uterus, part unspecified: Secondary | ICD-10-CM

## 2016-03-26 DIAGNOSIS — C541 Malignant neoplasm of endometrium: Secondary | ICD-10-CM | POA: Diagnosis not present

## 2016-03-26 MED ORDER — HEPARIN SOD (PORK) LOCK FLUSH 100 UNIT/ML IV SOLN
500.0000 [IU] | Freq: Every day | INTRAVENOUS | Status: AC | PRN
  Administered 2016-03-26: 500 [IU]
  Filled 2016-03-26: qty 5

## 2016-03-26 MED ORDER — ACETAMINOPHEN 325 MG PO TABS
650.0000 mg | ORAL_TABLET | Freq: Once | ORAL | Status: AC
  Administered 2016-03-26: 650 mg via ORAL
  Filled 2016-03-26: qty 2

## 2016-03-26 MED ORDER — DIPHENHYDRAMINE HCL 25 MG PO CAPS
25.0000 mg | ORAL_CAPSULE | Freq: Once | ORAL | Status: AC
  Administered 2016-03-26: 25 mg via ORAL
  Filled 2016-03-26: qty 1

## 2016-03-26 MED ORDER — SODIUM CHLORIDE 0.9 % IV SOLN
250.0000 mL | Freq: Once | INTRAVENOUS | Status: AC
  Administered 2016-03-26: 250 mL via INTRAVENOUS
  Filled 2016-03-26: qty 250

## 2016-03-26 MED ORDER — SODIUM CHLORIDE 0.9% FLUSH
10.0000 mL | INTRAVENOUS | Status: AC | PRN
  Administered 2016-03-26: 10 mL
  Filled 2016-03-26: qty 10

## 2016-03-26 NOTE — Progress Notes (Signed)
[place] @ [facility] Telephone:([phone]) [phone]  Fax:(336) [phone]     Nelli Swalley OB: [birth year]  MR#: [id number]  ZHG#:[id number]  [name], MD as PCP - General (Family Medicine) [name], MD (General Surgery)  CHIEF COMPLAINT:  Chief Complaint  Patient presents with  . Endometrial Cancer    Chief Complaint/Diagnosis:   The patient is a 65 year old with metastatic endometrial cancer who is seen for assessment during ongoing palliative radiation therapy.  04/2011   papillary serous adenocarcinoma of the endometrium, stage IIIb (nodal metastasis)              TAHBSO and full staging,               GOG protocl using XRT and chemotherapy, significant XRT side-effects,               continued on chemotherapy off protocol, 04/2013   recurrence in the nodal areas,              DDP and docetaxel x 6 completed in December,   started on chemotherapy with Adriamycin(October, 2015) poor tolerance to letrozole  and affinator Estrogen and progesterone receptor negative tumor         hER-2/neu recetor negative. November 07, 2014 progressive disease by  tumor marker criteria and clinical examination November 12, 2014 After 6 cycles of chemotherapy because of increasing side effect chemotherapy was discontinued.  (March, 2016) Patient was started on cis-platinum and topotecan. May, 2016 Cis-platinum and topotecan  and was discontinued because of significant side effect and possibility of progressive disease.  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  Patient is here for ongoing evaluation and treatment consideration Patient is here for ongoing evaluation regarding progressive  endometrial cancer.. Patient on supportive therapy with Megace and IV fluid intermittently improving.  Diarrhea has resolved.  No abdominal pain.  No nausea.  Left supraclavicular lymph node is enlarged but stable  Patient's condition has been declining.  Patient was hospitalized with severe constipation underwent lactulose frequently.  Increasing abdominal pain and discomfort poor appetite and losing weight extremely depressed. Pain is somewhat better controlled with fentanyl patch which has been increased during hospital ligation REVIEW OF SYSTEMS:    general status: Patient is feeling weak and tired.  Feeling lightheaded No change in a performance status.  No chills.  No fever.  Feeling extremely weak and tired HEENT: Alopecia.  No evidence of stomatitis increasing discomfort on the left side of the neck Lungs: No cough or shortness of breath Cardiac: No chest pain or paroxysmal nocturnal dyspnea GI: He had 6 yesterday On stay patient followed by black stool. Skin: Macular rash grade 2, as described above.  Patient is taking Zyrtec in the morning and Benadryl in the night   Lower extremity no swelling Neurological system: No tingling.  No numbness.  No other focal signs Patient continues to have poor oral intake.  Intermittent abdominal pain control with some fentanyl patch and breakthrough pain medication  requiring intravenous fluid off and on.  Patient is off all chemotherapy at present time  PAST MEDICAL HISTORY: Past Medical History  Diagnosis Date  . Cancer ([place]) 2012  . GI problem 2013  . Cancer  of uterus ([place]) 04/18/2015  . Uterine cancer ([place])   . Wears hearing aid     bilateral    PAST SURGICAL HISTORY: Past Surgical History  Procedure Laterality Date  . Abdominal hysterectomy    . Tonsillectomy    . Cataract extraction w/phaco Left 02/11/2016    Procedure: CATARACT EXTRACTION PHACO AND INTRAOCULAR LENS PLACEMENT (IOC);  Surgeon: Leandrew Koyanagi, MD;  Location:  [facility];  Service: Ophthalmology;  Laterality: Left;    FAMILY HISTORY No significant family history.      ADVANCED DIRECTIVES: Patient does have advance directive   HEALTH MAINTENANCE: Social History  Substance Use Topics  . Smoking status: Never Smoker   . Smokeless tobacco: Never Used  . Alcohol Use: 0.0 oz/week    0 Standard drinks or equivalent per week      Allergies  Allergen Reactions  . Penicillins Rash and Other (See Comments)     Has patient had a PCN reaction causing immediate rash, facial/tongue/throat swelling, SOB or lightheadedness with hypotension: No Has patient had a PCN reaction causing severe rash involving mucus membranes or skin necrosis: No Has patient had a PCN reaction that required hospitalization No Has patient had a PCN reaction occurring within the last 10 years: No If all of the above answers are "NO", then may proceed with Cephalosporin use.    Current Outpatient Prescriptions  Medication Sig Dispense Refill  . acetaminophen (TYLENOL) 500 MG tablet Take 1,000 mg by mouth every 6 (six) hours as needed for mild pain or headache.     . ALPRAZolam (XANAX) 0.5 MG tablet Take 1 tablet (0.5 mg total) by mouth 2 (two) times daily. 60 tablet 3  . amitriptyline (ELAVIL) 25 MG tablet TAKE 1 TABLET(25 MG) BY MOUTH AT BEDTIME 30 tablet 0  . cetirizine (ZYRTEC) 10 MG tablet Take 10 mg by mouth daily as needed for allergies.     Marland Kitchen docusate sodium (COLACE) 100 MG capsule Take 200 mg by mouth 2 (two) times daily as needed for mild constipation.     . fentaNYL (DURAGESIC - DOSED MCG/HR) 50 MCG/HR Place 1 patch (50 mcg total) onto the skin every 3 (three) days. 10 patch 0  . fluticasone (FLONASE) 50 MCG/ACT nasal spray Place 1-2 sprays into both nostrils daily as needed for rhinitis.     Marland Kitchen HYDROcodone-acetaminophen (NORCO/VICODIN) 5-325 MG tablet Take 1 tablet by mouth every 6 (six) hours as needed for moderate pain. 60 tablet 0  . lactulose  (CHRONULAC) 10 GM/15ML solution TAKE 45 ML BY MOUTH TWICE DAILY AS NEEDED FOR SEVERE CONSTIPATION. 8640 mL 3  . megestrol (MEGACE) 400 MG/10ML suspension Take 10 mLs (400 mg total) by mouth 2 (two) times daily. 480 mL 3  . metoCLOPramide (REGLAN) 10 MG tablet Take 1 tablet (10 mg total) by mouth 4 (four) times daily as needed for nausea. 60 tablet 3  . mirtazapine (REMERON) 15 MG tablet Take 15 mg by mouth at bedtime.   3  . omeprazole (PRILOSEC) 20 MG capsule Take 20 mg by mouth 2 (two) times daily before a meal.    . ondansetron (ZOFRAN) 4 MG tablet Take 1 tablet (4 mg total) by mouth every 6 (six) hours as needed for nausea or vomiting. 60 tablet 3  . polyethylene glycol (MIRALAX / GLYCOLAX) packet Take 17 g by mouth at bedtime.     . potassium chloride (K-DUR,KLOR-CON) 10 MEQ tablet Take 10 mEq by mouth daily.    . predniSONE (DELTASONE) 20  MG tablet Take 1 tablet (20 mg total) by mouth daily with breakfast. 30 tablet 3  . sertraline (ZOLOFT) 100 MG tablet Take 100 mg by mouth daily.     No current facility-administered medications for this visit.   Facility-Administered Medications Ordered in Other Visits  Medication Dose Route Frequency Provider Last Rate Last Dose  . 0.9 %  sodium chloride infusion   Intravenous Continuous Forest Gleason, MD 10 mL/hr at 11/04/15 1415    . sodium chloride 0.9 % injection 10 mL  10 mL Intracatheter PRN Forest Gleason, MD   10 mL at 05/06/15 1028  . sodium chloride 0.9 % injection 10 mL  10 mL Intravenous PRN Forest Gleason, MD      . sodium chloride 0.9 % injection 10 mL  10 mL Intravenous PRN Forest Gleason, MD        OBJECTIVE:  Filed Vitals:   03/25/16 1341  BP: 138/80  Pulse: 112  Temp: 99.4 F (37.4 C)  Resp: 18     Body mass index is 20.82 kg/(m^2).    ECOG FS:1 - Symptomatic but completely ambulatory  PHYSICAL EXAM: General status: Patient is pale looking and not any acute distress Lymphatic system: Palpable left supraclavicular lymph node.  Is  increasing in the size.   Slightly increased size . Abdomen: Mild distention generalized tenderness no palpable masses no ascites Lungs: Air entry equal on both sides.  No crepitation or rhonchi or rales Cardiac: Tachycardia Lower extremity trace swelling Skin: No rash Neurological system no focal sign  LAB RESULTS:  Infusion on 03/25/2016  Component Date Value Ref Range Status  . ABO/RH(D) 03/25/2016 O POS   Final  . Antibody Screen 03/25/2016 NEG   Final  . Sample Expiration 03/25/2016 03/28/2016   Final  . Unit Number 03/25/2016 [id number]   Final  . Blood Component Type 03/25/2016 RBC, LR IRR   Final  . Unit division 03/25/2016 00   Final  . Status of Unit 03/25/2016 ISSUED   Final  . Transfusion Status 03/25/2016 OK TO TRANSFUSE   Final  . Crossmatch Result 03/25/2016 Compatible   Final  . Order Confirmation 03/25/2016 ORDER PROCESSED BY BLOOD BANK   Final  Appointment on 03/25/2016  Component Date Value Ref Range Status  . Sodium 03/25/2016 130* 135 - 145 mmol/L Final  . Potassium 03/25/2016 4.7  3.5 - 5.1 mmol/L Final  . Chloride 03/25/2016 102  101 - 111 mmol/L Final  . CO2 03/25/2016 20* 22 - 32 mmol/L Final  . Glucose, Bld 03/25/2016 110* 65 - 99 mg/dL Final  . BUN 03/25/2016 22* 6 - 20 mg/dL Final  . Creatinine, Ser 03/25/2016 1.27* 0.44 - 1.00 mg/dL Final  . Calcium 03/25/2016 8.5* 8.9 - 10.3 mg/dL Final  . Total Protein 03/25/2016 7.0  6.5 - 8.1 g/dL Final  . Albumin 03/25/2016 2.8* 3.5 - 5.0 g/dL Final  . AST 03/25/2016 14* 15 - 41 U/L Final  . ALT 03/25/2016 10* 14 - 54 U/L Final  . Alkaline Phosphatase 03/25/2016 86  38 - 126 U/L Final  . Total Bilirubin 03/25/2016 0.4  0.3 - 1.2 mg/dL Final  . GFR calc non Af Amer 03/25/2016 43* >60 mL/min Final  . GFR calc Af Amer 03/25/2016 49* >60 mL/min Final   Comment: (NOTE) The eGFR has been calculated using the CKD EPI equation. This calculation has not been validated in all clinical situations. eGFR's  persistently <60 mL/min signify possible Chronic Kidney Disease.   Georgiann Hahn  gap 03/25/2016 8  5 - 15 Final  . Magnesium 03/25/2016 2.0  1.7 - 2.4 mg/dL Final  . WBC 03/25/2016 9.8  3.6 - 11.0 K/uL Final  . RBC 03/25/2016 2.51* 3.80 - 5.20 MIL/uL Final  . Hemoglobin 03/25/2016 7.5* 12.0 - 16.0 g/dL Final  . HCT 03/25/2016 22.5* 35.0 - 47.0 % Final  . MCV 03/25/2016 89.6  80.0 - 100.0 fL Final  . MCH 03/25/2016 30.0  26.0 - 34.0 pg Final  . MCHC 03/25/2016 33.5  32.0 - 36.0 g/dL Final  . RDW 03/25/2016 16.2* 11.5 - 14.5 % Final  . Platelets 03/25/2016 400  150 - 440 K/uL Final  . Neutrophils Relative % 03/25/2016 93   Final  . Neutro Abs 03/25/2016 9.1* 1.4 - 6.5 K/uL Final  . Lymphocytes Relative 03/25/2016 3   Final  . Lymphs Abs 03/25/2016 0.3* 1.0 - 3.6 K/uL Final  . Monocytes Relative 03/25/2016 4   Final  . Monocytes Absolute 03/25/2016 0.3  0.2 - 0.9 K/uL Final  . Eosinophils Relative 03/25/2016 0   Final  . Eosinophils Absolute 03/25/2016 0.0  0 - 0.7 K/uL Final  . Basophils Relative 03/25/2016 0   Final  . Basophils Absolute 03/25/2016 0.0  0 - 0.1 K/uL Final    Lab Results  Component Value Date   CA125 18.8 07/23/2014    ASSESSMENT: Carcinoma of endometrium  Stage IV disease  Recently patient was hospitalized all the records were reviewed CT scan has been reviewed independently shows progressive disease Generalize weakness.  Hemoglobin is low patient may benefit with improvement in the symptoms by transfusion 1 unit of packed cell transfusion has been ordered to see whether there is any improvement in quality of life I had a prolonged discussion with patient that overall our options are limited.  Patient should consider palliative care and patient has been referred to hospice after prolonged discussion.  Patient continues to desire IV fluid which makes her feel better.  On steroids. Total duration of visit was 45 minutes.  50% or more time was spent in counseling patient  and family regarding prognosis and options of treatment and available resources.  All records from [facility] has been reviewed.  All lab data has been reviewed.  Transfusion is been ordered.  Patient has been referred to hospice. Progressive disease (stage IV endometrial cancer) patient has failed multiple  Cancer of uterus   Staging form: Corpus Uteri - Carcinoma, AJCC 7th Edition     Clinical: Stage IVB (T3b, N2, M1) - Signed by Forest Gleason, MD on 04/18/2015   Forest Gleason, MD   03/26/2016 7:13 PMy.

## 2016-03-28 LAB — TYPE AND SCREEN
ABO/RH(D): O POS
ANTIBODY SCREEN: NEGATIVE
Unit division: 0

## 2016-03-31 ENCOUNTER — Telehealth: Payer: Self-pay | Admitting: *Deleted

## 2016-03-31 MED ORDER — FENTANYL 25 MCG/HR TD PT72: 25.0000 ug | MEDICATED_PATCH | TRANSDERMAL | Status: DC

## 2016-03-31 NOTE — Telephone Encounter (Signed)
Clled to report that her pain is not controlled on current regimen. She is on fentanyl 50 mcg adn Norco every 4 hours adn the best her pain ever gets is a 5/10. Asking to adjust her meds

## 2016-03-31 NOTE — Telephone Encounter (Signed)
PerDr Choksi, increase fentanyl to 75 mcg and if not relieved in 4 days may increase to 100 mcg. Lisa informed and rx for fentanyl 25 mcg faxed to [facility]

## 2016-04-01 ENCOUNTER — Telehealth: Payer: Self-pay | Admitting: *Deleted

## 2016-04-01 NOTE — Telephone Encounter (Signed)
Hospice can give IVF or pt can get IVF in [employer] tomorrow.

## 2016-04-01 NOTE — Telephone Encounter (Signed)
I spoke with pt and she is in agreement to have hospice nurse give her IVF. Order will be faxed to [id number], I informed Lattie Haw that order is being faxed

## 2016-04-01 NOTE — Telephone Encounter (Signed)
Called answering service left msg that she needs IVF she is weak and lightheaded. I spoke with Lattie Haw the hospice nnurse who said she cqan give IVF with an order, but it would be tomorrow before she can get supplies together

## 2016-04-01 NOTE — Telephone Encounter (Signed)
Patient feeling weak and dehydrated.  Wants to know if she can get IVF's?

## 2016-04-03 ENCOUNTER — Other Ambulatory Visit: Payer: Self-pay | Admitting: Family Medicine

## 2016-04-08 ENCOUNTER — Inpatient Hospital Stay (HOSPITAL_BASED_OUTPATIENT_CLINIC_OR_DEPARTMENT_OTHER): Admitting: Oncology

## 2016-04-08 ENCOUNTER — Inpatient Hospital Stay: Attending: Oncology

## 2016-04-08 ENCOUNTER — Inpatient Hospital Stay

## 2016-04-08 VITALS — BP 120/75 | HR 112 | Temp 99.5°F | Resp 18 | Wt 114.1 lb

## 2016-04-08 DIAGNOSIS — M25551 Pain in right hip: Secondary | ICD-10-CM

## 2016-04-08 DIAGNOSIS — C541 Malignant neoplasm of endometrium: Secondary | ICD-10-CM | POA: Insufficient documentation

## 2016-04-08 DIAGNOSIS — C772 Secondary and unspecified malignant neoplasm of intra-abdominal lymph nodes: Secondary | ICD-10-CM | POA: Insufficient documentation

## 2016-04-08 DIAGNOSIS — Z9221 Personal history of antineoplastic chemotherapy: Secondary | ICD-10-CM

## 2016-04-08 DIAGNOSIS — C55 Malignant neoplasm of uterus, part unspecified: Secondary | ICD-10-CM

## 2016-04-08 DIAGNOSIS — R109 Unspecified abdominal pain: Secondary | ICD-10-CM | POA: Diagnosis not present

## 2016-04-08 DIAGNOSIS — Z79899 Other long term (current) drug therapy: Secondary | ICD-10-CM

## 2016-04-08 LAB — CBC WITH DIFFERENTIAL/PLATELET
Basophils Absolute: 0 10*3/uL (ref 0–0.1)
Basophils Relative: 0 %
EOS ABS: 0 10*3/uL (ref 0–0.7)
Eosinophils Relative: 0 %
HCT: 26.1 % — ABNORMAL LOW (ref 35.0–47.0)
HEMOGLOBIN: 8.6 g/dL — AB (ref 12.0–16.0)
LYMPHS ABS: 0.3 10*3/uL — AB (ref 1.0–3.6)
Lymphocytes Relative: 3 %
MCH: 29.5 pg (ref 26.0–34.0)
MCHC: 32.9 g/dL (ref 32.0–36.0)
MCV: 89.4 fL (ref 80.0–100.0)
MONO ABS: 0.3 10*3/uL (ref 0.2–0.9)
MONOS PCT: 3 %
NEUTROS PCT: 94 %
Neutro Abs: 8.9 10*3/uL — ABNORMAL HIGH (ref 1.4–6.5)
Platelets: 344 10*3/uL (ref 150–440)
RBC: 2.92 MIL/uL — ABNORMAL LOW (ref 3.80–5.20)
RDW: 15.9 % — AB (ref 11.5–14.5)
WBC: 9.5 10*3/uL (ref 3.6–11.0)

## 2016-04-08 LAB — COMPREHENSIVE METABOLIC PANEL
ALK PHOS: 83 U/L (ref 38–126)
ALT: 8 U/L — ABNORMAL LOW (ref 14–54)
ANION GAP: 10 (ref 5–15)
AST: 12 U/L — ABNORMAL LOW (ref 15–41)
Albumin: 2.7 g/dL — ABNORMAL LOW (ref 3.5–5.0)
BILIRUBIN TOTAL: 0.3 mg/dL (ref 0.3–1.2)
BUN: 24 mg/dL — ABNORMAL HIGH (ref 6–20)
CO2: 19 mmol/L — AB (ref 22–32)
CREATININE: 1.22 mg/dL — AB (ref 0.44–1.00)
Calcium: 8.9 mg/dL (ref 8.9–10.3)
Chloride: 103 mmol/L (ref 101–111)
GFR calc non Af Amer: 45 mL/min — ABNORMAL LOW (ref >60)
GFR, EST AFRICAN AMERICAN: 52 mL/min — AB (ref >60)
Glucose, Bld: 139 mg/dL — ABNORMAL HIGH (ref 65–99)
POTASSIUM: 4.3 mmol/L (ref 3.5–5.1)
SODIUM: 132 mmol/L — AB (ref 135–145)
Total Protein: 6.9 g/dL (ref 6.5–8.1)

## 2016-04-08 MED ORDER — PROMETHAZINE HCL 25 MG RE SUPP: 25.0000 mg | Freq: Every day | RECTAL | Status: AC | PRN

## 2016-04-08 MED ORDER — FENTANYL 100 MCG/HR TD PT72: 100.0000 ug | MEDICATED_PATCH | TRANSDERMAL | Status: AC

## 2016-04-08 MED ORDER — KETOROLAC TROMETHAMINE 15 MG/ML IJ SOLN
15.0000 mg | Freq: Once | INTRAMUSCULAR | Status: AC
  Administered 2016-04-08: 15 mg via INTRAVENOUS
  Filled 2016-04-08: qty 1

## 2016-04-08 MED ORDER — SODIUM CHLORIDE 0.9 % IV SOLN
Freq: Once | INTRAVENOUS | Status: AC
  Administered 2016-04-08: 16:00:00 via INTRAVENOUS
  Filled 2016-04-08: qty 2

## 2016-04-08 MED ORDER — SODIUM CHLORIDE 0.9 % IV SOLN
Freq: Once | INTRAVENOUS | Status: AC
  Administered 2016-04-08: 16:00:00 via INTRAVENOUS
  Filled 2016-04-08: qty 1000

## 2016-04-08 MED ORDER — SODIUM CHLORIDE 0.9 % IJ SOLN
10.0000 mL | INTRAMUSCULAR | Status: DC | PRN
  Filled 2016-04-08: qty 10

## 2016-04-08 MED ORDER — SODIUM CHLORIDE 0.9 % IV SOLN
INTRAVENOUS | Status: DC
  Administered 2016-04-08: 15:00:00 via INTRAVENOUS
  Filled 2016-04-08: qty 1000

## 2016-04-08 MED ORDER — HEPARIN SOD (PORK) LOCK FLUSH 100 UNIT/ML IV SOLN
500.0000 [IU] | Freq: Once | INTRAVENOUS | Status: AC | PRN
  Administered 2016-04-08: 500 [IU]
  Filled 2016-04-08: qty 5

## 2016-04-09 ENCOUNTER — Telehealth: Payer: Self-pay | Admitting: *Deleted

## 2016-04-09 NOTE — Telephone Encounter (Signed)
Patient states aftet getting the IV pain medication yesterday in infusion the pain in her abdomen is completely gone.  Wants to know if there is a pill form.

## 2016-04-10 ENCOUNTER — Encounter: Payer: Self-pay | Admitting: Oncology

## 2016-04-10 NOTE — Progress Notes (Signed)
[place] @ [facility] [phone]  [phone]     [name] Graham OB: July 14, 1951  MR#: [id number]  CHY#:[id number]  [name], MD as PCP - General (Family Medicine) [name], MD (General Surgery)  CHIEF COMPLAINT:  Chief Complaint  Patient presents with  . Uterine Cancer    Chief Complaint/Diagnosis:   The patient is a 65 year old with metastatic endometrial cancer who is seen for assessment during ongoing palliative radiation therapy.  04/2011   papillary serous adenocarcinoma of the endometrium, stage IIIb (nodal metastasis)              TAHBSO and full staging,               GOG protocl using XRT and chemotherapy, significant XRT side-effects,               continued on chemotherapy off protocol, 04/2013   recurrence in the nodal areas,              DDP and docetaxel x 6 completed in December,   started on chemotherapy with Adriamycin(October, 2015) poor tolerance to letrozole  and affinator Estrogen and progesterone receptor negative tumor         hER-2/neu recetor negative. November 07, 2014 progressive disease by  tumor marker criteria and clinical examination November 12, 2014 After 6 cycles of chemotherapy because of increasing side effect chemotherapy was discontinued.  (March, 2016) Patient was started on cis-platinum and topotecan. May, 2016 Cis-platinum and topotecan  and was discontinued because of significant side effect and possibility of progressive disease.  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  Patient is here for ongoing evaluation and treatment consideration Patient is here for ongoing evaluation regarding progressive  endometrial cancer.. Patient on supportive therapy with Megace and IV fluid intermittently improving.  Diarrhea has resolved.  No abdominal pain.  No nausea.  Left supraclavicular lymph node is enlarged but stable  Patient's condition has been declining.  Patient was hospitalized with severe constipation underwent lactulose frequently.  Increasing abdominal pain and discomfort poor appetite and losing weight extremely depressed. \Patient has noted some increasing swelling in the left supraclavicular area.  Increasing discomfort but better controlled with multiple breakthrough pain medication.  Appetite poor.  Patient continues to be depressed.  Patient is under hospice care in getting intermittent IV fluid at home Abdominal discomfort is increased.    REVIEW OF SYSTEMS:    general status: Patient is feeling weak and tired.  Feeling lightheaded No change in a performance status.  No chills.  No fever.  Feeling extremely weak and tired Poor appetite HEENT: .  No evidence of stomatitis increasing discomfort on the left side of the neck Lungs: No cough or shortness of breath Cardiac: No chest pain or paroxysmal nocturnal dyspnea GI: Increasing abdominal discomfort.  Pain is mainly localized the right lower quadrant constant dull aching requiring number of breakthrough pain medication On stay patient followed by black stool. Skin: Macular rash grade 2, as described above.  Patient is taking Zyrtec in the morning and Benadryl in the night   Lower extremity no swelling Neurological system: No tingling.  No numbness.  No other focal signs Patient continues to have  poor oral intake.  Intermittent abdominal pain control with some fentanyl patch and breakthrough pain medication  requiring intravenous fluid off and on.  Patient is off all chemotherapy at present time  PAST MEDICAL HISTORY: Past Medical History  Diagnosis Date  . Cancer ([place]) 2012  . GI problem 2013  . Cancer of uterus ([place]) 04/18/2015    . Uterine cancer ([place])   . Wears hearing aid     bilateral    PAST SURGICAL HISTORY: Past Surgical History  Procedure Laterality Date  . Abdominal hysterectomy    . Tonsillectomy    . Cataract extraction w/phaco Left 02/11/2016    Procedure: CATARACT EXTRACTION PHACO AND INTRAOCULAR LENS PLACEMENT (IOC);  Surgeon: Leandrew Koyanagi, MD;  Location: [place];  Service: Ophthalmology;  Laterality: Left;    FAMILY HISTORY No significant family history.      ADVANCED DIRECTIVES: Patient does have advance directive   HEALTH MAINTENANCE: Social History  Substance Use Topics  . Smoking status: Never Smoker   . Smokeless tobacco: Never Used  . Alcohol Use: 0.0 oz/week    0 Standard drinks or equivalent per week      Allergies  Allergen Reactions  . Penicillins Rash and Other (See Comments)     Has patient had a PCN reaction causing immediate rash, facial/tongue/throat swelling, SOB or lightheadedness with hypotension: No Has patient had a PCN reaction causing severe rash involving mucus membranes or skin necrosis: No Has patient had a PCN reaction that required hospitalization No Has patient had a PCN reaction occurring within the last 10 years: No If all of the above answers are "NO", then may proceed with Cephalosporin use.    Current Outpatient Prescriptions  Medication Sig Dispense Refill  . acetaminophen (TYLENOL) 500 MG tablet Take 1,000 mg by mouth every 6 (six) hours as needed for mild pain or headache.     . ALPRAZolam (XANAX) 0.5 MG tablet Take 1 tablet (0.5 mg total) by mouth 2 (two) times daily. 60 tablet 3  . amitriptyline (ELAVIL) 25 MG tablet TAKE 1 TABLET(25 MG) BY MOUTH AT BEDTIME 30 tablet 0  . cetirizine (ZYRTEC) 10 MG tablet Take 10 mg by mouth daily as needed for allergies.     Marland Kitchen docusate sodium (COLACE) 100 MG capsule Take 200 mg by mouth 2 (two) times daily as needed for mild constipation.     . fentaNYL (DURAGESIC - DOSED MCG/HR) 100  MCG/HR Place 1 patch (100 mcg total) onto the skin every 3 (three) days. 5 patch 0  . fluticasone (FLONASE) 50 MCG/ACT nasal spray Place 1-2 sprays into both nostrils daily as needed for rhinitis.     Marland Kitchen HYDROcodone-acetaminophen (NORCO/VICODIN) 5-325 MG tablet Take 1 tablet by mouth every 6 (six) hours as needed for moderate pain. 60 tablet 0  . lactulose (CHRONULAC) 10 GM/15ML solution TAKE 45 ML BY MOUTH TWICE DAILY AS NEEDED FOR SEVERE CONSTIPATION. 8640 mL 3  . megestrol (MEGACE) 400 MG/10ML suspension Take 10 mLs (400 mg total) by mouth 2 (two) times daily. 480 mL 3  . metoCLOPramide (REGLAN) 10 MG tablet Take 1 tablet (10 mg total) by mouth 4 (four) times daily as needed for nausea. 60 tablet 3  . mirtazapine (REMERON) 15 MG tablet Take 15 mg by mouth at bedtime.   3  . omeprazole (PRILOSEC) 20 MG capsule Take 20 mg by mouth 2 (two) times daily before a meal.    . ondansetron (ZOFRAN) 4 MG tablet Take 1 tablet (4  mg total) by mouth every 6 (six) hours as needed for nausea or vomiting. 60 tablet 3  . polyethylene glycol (MIRALAX / GLYCOLAX) packet Take 17 g by mouth at bedtime.     . potassium chloride (K-DUR,KLOR-CON) 10 MEQ tablet Take 10 mEq by mouth daily.    . predniSONE (DELTASONE) 20 MG tablet Take 1 tablet (20 mg total) by mouth daily with breakfast. 30 tablet 3  . sertraline (ZOLOFT) 100 MG tablet Take 100 mg by mouth daily.    . sertraline (ZOLOFT) 50 MG tablet TAKE 1 TABLET BY MOUTH EVERY MORNING 30 tablet 0  . fentaNYL (DURAGESIC - DOSED MCG/HR) 50 MCG/HR     . promethazine (PHENERGAN) 25 MG suppository Place 1 suppository (25 mg total) rectally daily as needed for nausea or vomiting. 6 each 0   No current facility-administered medications for this visit.   Facility-Administered Medications Ordered in Other Visits  Medication Dose Route Frequency Provider Last Rate Last Dose  . 0.9 %  sodium chloride infusion   Intravenous Continuous Forest Gleason, MD 10 mL/hr at 11/04/15 1415     . sodium chloride 0.9 % injection 10 mL  10 mL Intracatheter PRN Forest Gleason, MD   10 mL at 05/06/15 1028  . sodium chloride 0.9 % injection 10 mL  10 mL Intravenous PRN Forest Gleason, MD      . sodium chloride 0.9 % injection 10 mL  10 mL Intravenous PRN Forest Gleason, MD        OBJECTIVE:  Filed Vitals:   04/08/16 1351  BP: 120/75  Pulse: 112  Temp: 99.5 F (37.5 C)  Resp: 18     Body mass index is 20.21 kg/(m^2).    ECOG FS:2 - Symptomatic, <50% confined to bed  PHYSICAL EXAM: General status: Patient is pale looking and not any acute distress Lymphatic system: Palpable left supraclavicular lymph node.  Is increasing in the size.   Slightly increased size . Abdomen: Mild distention generalized tenderness no palpable masses no ascites Vaguely palpable mass.  Increasing pain. Lungs: Air entry equal on both sides.  No crepitation or rhonchi or rales Cardiac: Tachycardia Lower extremity trace swelling Skin: No rash Neurological system no focal sign  LAB RESULTS:  Appointment on 04/08/2016  Component Date Value Ref Range Status  . WBC 04/08/2016 9.5  3.6 - 11.0 K/uL Final  . RBC 04/08/2016 2.92* 3.80 - 5.20 MIL/uL Final  . Hemoglobin 04/08/2016 8.6* 12.0 - 16.0 g/dL Final  . HCT 04/08/2016 26.1* 35.0 - 47.0 % Final  . MCV 04/08/2016 89.4  80.0 - 100.0 fL Final  . MCH 04/08/2016 29.5  26.0 - 34.0 pg Final  . MCHC 04/08/2016 32.9  32.0 - 36.0 g/dL Final  . RDW 04/08/2016 15.9* 11.5 - 14.5 % Final  . Platelets 04/08/2016 344  150 - 440 K/uL Final  . Neutrophils Relative % 04/08/2016 94   Final  . Neutro Abs 04/08/2016 8.9* 1.4 - 6.5 K/uL Final  . Lymphocytes Relative 04/08/2016 3   Final  . Lymphs Abs 04/08/2016 0.3* 1.0 - 3.6 K/uL Final  . Monocytes Relative 04/08/2016 3   Final  . Monocytes Absolute 04/08/2016 0.3  0.2 - 0.9 K/uL Final  . Eosinophils Relative 04/08/2016 0   Final  . Eosinophils Absolute 04/08/2016 0.0  0 - 0.7 K/uL Final  . Basophils Relative 04/08/2016  0   Final  . Basophils Absolute 04/08/2016 0.0  0 - 0.1 K/uL Final  . Sodium 04/08/2016 132* 135 -  145 mmol/L Final  . Potassium 04/08/2016 4.3  3.5 - 5.1 mmol/L Final  . Chloride 04/08/2016 103  101 - 111 mmol/L Final  . CO2 04/08/2016 19* 22 - 32 mmol/L Final  . Glucose, Bld 04/08/2016 139* 65 - 99 mg/dL Final  . BUN 04/08/2016 24* 6 - 20 mg/dL Final  . Creatinine, Ser 04/08/2016 1.22* 0.44 - 1.00 mg/dL Final  . Calcium 04/08/2016 8.9  8.9 - 10.3 mg/dL Final  . Total Protein 04/08/2016 6.9  6.5 - 8.1 g/dL Final  . Albumin 04/08/2016 2.7* 3.5 - 5.0 g/dL Final  . AST 04/08/2016 12* 15 - 41 U/L Final  . ALT 04/08/2016 8* 14 - 54 U/L Final  . Alkaline Phosphatase 04/08/2016 83  38 - 126 U/L Final  . Total Bilirubin 04/08/2016 0.3  0.3 - 1.2 mg/dL Final  . GFR calc non Af Amer 04/08/2016 45* >60 mL/min Final  . GFR calc Af Amer 04/08/2016 52* >60 mL/min Final   Comment: (NOTE) The eGFR has been calculated using the CKD EPI equation. This calculation has not been validated in all clinical situations. eGFR's persistently <60 mL/min signify possible Chronic Kidney Disease.   . Anion gap 04/08/2016 10  5 - 15 Final    Lab Results  Component Value Date   CA125 18.8 07/23/2014    ASSESSMENT: Carcinoma of endometrium  Stage IV disease  Patient is now under hospice care requiring intermittent IV fluid Increasing left supraclavicular mass Increased abdominal pain is most likely secondary to progressing metastatic gastric disease Increase fentanyl patch 100 g.  IV fluid and IV Decadron and Zofran   All records from Hospital has been reviewed.  All lab data has been reviewed.  Transfusion is been ordered.  Patient has been referred to hospice. Progressive disease (stage IV endometrial cancer) patient has failed multiple  Cancer of uterus   Staging form: Corpus Uteri - Carcinoma, AJCC 7th Edition     Clinical: Stage IVB (T3b, N2, M1) - Signed by Forest Gleason, MD on  04/18/2015   Forest Gleason, MD   04/10/2016 10:02 AMy.

## 2016-04-12 ENCOUNTER — Other Ambulatory Visit: Payer: Self-pay | Admitting: *Deleted

## 2016-04-12 DIAGNOSIS — C55 Malignant neoplasm of uterus, part unspecified: Secondary | ICD-10-CM

## 2016-04-12 DIAGNOSIS — M25551 Pain in right hip: Secondary | ICD-10-CM

## 2016-04-12 DIAGNOSIS — C541 Malignant neoplasm of endometrium: Secondary | ICD-10-CM

## 2016-04-12 MED ORDER — MELOXICAM 7.5 MG PO TABS: 7.5000 mg | ORAL_TABLET | Freq: Every day | ORAL | Status: AC

## 2016-04-12 MED ORDER — HYDROCODONE-ACETAMINOPHEN 5-325 MG PO TABS: 1.0000 | ORAL_TABLET | Freq: Four times a day (QID) | ORAL | Status: DC | PRN

## 2016-04-12 NOTE — Telephone Encounter (Signed)
toradol was given through IV at last visit. toradol PO is not recommended due to increased risk of GI bleed. Per [name], NP, will order meloxicam 7.5mg  daily to see if helps with abd pain. Pt verbalized understanding.

## 2016-04-13 ENCOUNTER — Other Ambulatory Visit: Payer: Self-pay | Admitting: Family Medicine

## 2016-04-13 ENCOUNTER — Telehealth: Payer: Self-pay | Admitting: *Deleted

## 2016-04-13 MED ORDER — HYDROCODONE-ACETAMINOPHEN 10-325 MG PO TABS: 1.0000 | ORAL_TABLET | ORAL | Status: AC | PRN

## 2016-04-13 MED ORDER — OXYCODONE ER 18 MG PO C12A: 2.0000 | EXTENDED_RELEASE_CAPSULE | Freq: Two times a day (BID) | ORAL | Status: DC

## 2016-04-13 NOTE — Telephone Encounter (Signed)
Her fentantyl was increased to 100 mcg last week and her pain is still not controlled, asking if her Hydrocodone can be increased to 10/ 325 mg from current 5/325 mg dose. Also asking if her Prednisone can be weaned off because she was taking it for appetite and it has not helped. Asking if we can schedule her for IVF some time this week

## 2016-04-13 NOTE — Telephone Encounter (Signed)
Per L Herring will change Fentanyl to Xtampza 36 mg bid, Increase Hydrocodone to 10/ 325 mg . Decrease Prednisone to 10 mg (1/2 tab) daily times 7 days then discontinue. Appt for IVF 04/14/16 given. Lisa notified adn patietn will be instructde to remove Fentanyl patch once [employer] is approved.

## 2016-04-14 ENCOUNTER — Inpatient Hospital Stay

## 2016-04-14 DIAGNOSIS — C55 Malignant neoplasm of uterus, part unspecified: Secondary | ICD-10-CM

## 2016-04-14 DIAGNOSIS — C541 Malignant neoplasm of endometrium: Secondary | ICD-10-CM | POA: Diagnosis not present

## 2016-04-14 MED ORDER — SODIUM CHLORIDE 0.9% FLUSH
10.0000 mL | INTRAVENOUS | Status: DC | PRN
  Administered 2016-04-14: 10 mL via INTRAVENOUS
  Filled 2016-04-14: qty 10

## 2016-04-14 MED ORDER — SODIUM CHLORIDE 0.9 % IV SOLN
Freq: Once | INTRAVENOUS | Status: AC
  Administered 2016-04-14: 12:00:00 via INTRAVENOUS
  Filled 2016-04-14: qty 2

## 2016-04-14 MED ORDER — SODIUM CHLORIDE 0.9 % IV SOLN
Freq: Once | INTRAVENOUS | Status: AC
  Administered 2016-04-14: 12:00:00 via INTRAVENOUS
  Filled 2016-04-14: qty 1000

## 2016-04-14 MED ORDER — SODIUM CHLORIDE 0.9 % IV SOLN
INTRAVENOUS | Status: DC
  Administered 2016-04-14: 12:00:00 via INTRAVENOUS
  Filled 2016-04-14: qty 1000

## 2016-04-14 MED ORDER — HEPARIN SOD (PORK) LOCK FLUSH 100 UNIT/ML IV SOLN
500.0000 [IU] | Freq: Once | INTRAVENOUS | Status: AC
Start: 2016-04-14 — End: 2016-04-14
  Administered 2016-04-14: 500 [IU] via INTRAVENOUS
  Filled 2016-04-14: qty 5

## 2016-04-19 ENCOUNTER — Other Ambulatory Visit: Payer: Self-pay | Admitting: Oncology

## 2016-04-22 ENCOUNTER — Other Ambulatory Visit: Payer: Self-pay | Admitting: Oncology

## 2016-04-22 ENCOUNTER — Telehealth: Payer: Self-pay | Admitting: *Deleted

## 2016-04-22 ENCOUNTER — Other Ambulatory Visit: Payer: Self-pay | Admitting: Hematology and Oncology

## 2016-04-22 MED ORDER — ONDANSETRON HCL 8 MG PO TABS: 8.0000 mg | ORAL_TABLET | Freq: Four times a day (QID) | ORAL | Status: DC | PRN

## 2016-04-22 NOTE — Telephone Encounter (Signed)
CAlled to inquire if her Zofran can be increased to 8 mg q 6 h prn as the 4 mg is not controlling nausea. Also asking for the order faxed over last week for IVF administration parameters to be faxed back

## 2016-04-22 NOTE — Telephone Encounter (Signed)
Per Dr Jeb Levering, ok to increase Zofran to 8 mg q 6 h prn and never saw fax regarding IVF please resend it Left message on [facility]

## 2016-04-26 ENCOUNTER — Other Ambulatory Visit: Payer: Self-pay | Admitting: *Deleted

## 2016-04-26 MED ORDER — OXYCODONE ER 18 MG PO C12A: 2.0000 | EXTENDED_RELEASE_CAPSULE | Freq: Two times a day (BID) | ORAL | Status: AC

## 2016-05-05 ENCOUNTER — Inpatient Hospital Stay

## 2016-05-05 ENCOUNTER — Inpatient Hospital Stay: Admitting: Oncology

## 2016-05-06 ENCOUNTER — Ambulatory Visit: Payer: Medicare Other | Admitting: Oncology

## 2016-05-06 ENCOUNTER — Other Ambulatory Visit: Payer: Medicare Other

## 2016-05-06 ENCOUNTER — Ambulatory Visit: Payer: Medicare Other

## 2016-06-05 DEATH — deceased

## 2016-08-16 IMAGING — CR DG PELVIS 1-2V
1 series · 1 of 1 positions shown · non-contrast
Comparison: Acute abdomen series 12/26/2015

CLINICAL DATA: Right hip pain for 2 months without trauma. History
of uterine cancer.

EXAM:
PELVIS - 1-2 VIEW

[dg pelvis 1-2 views]
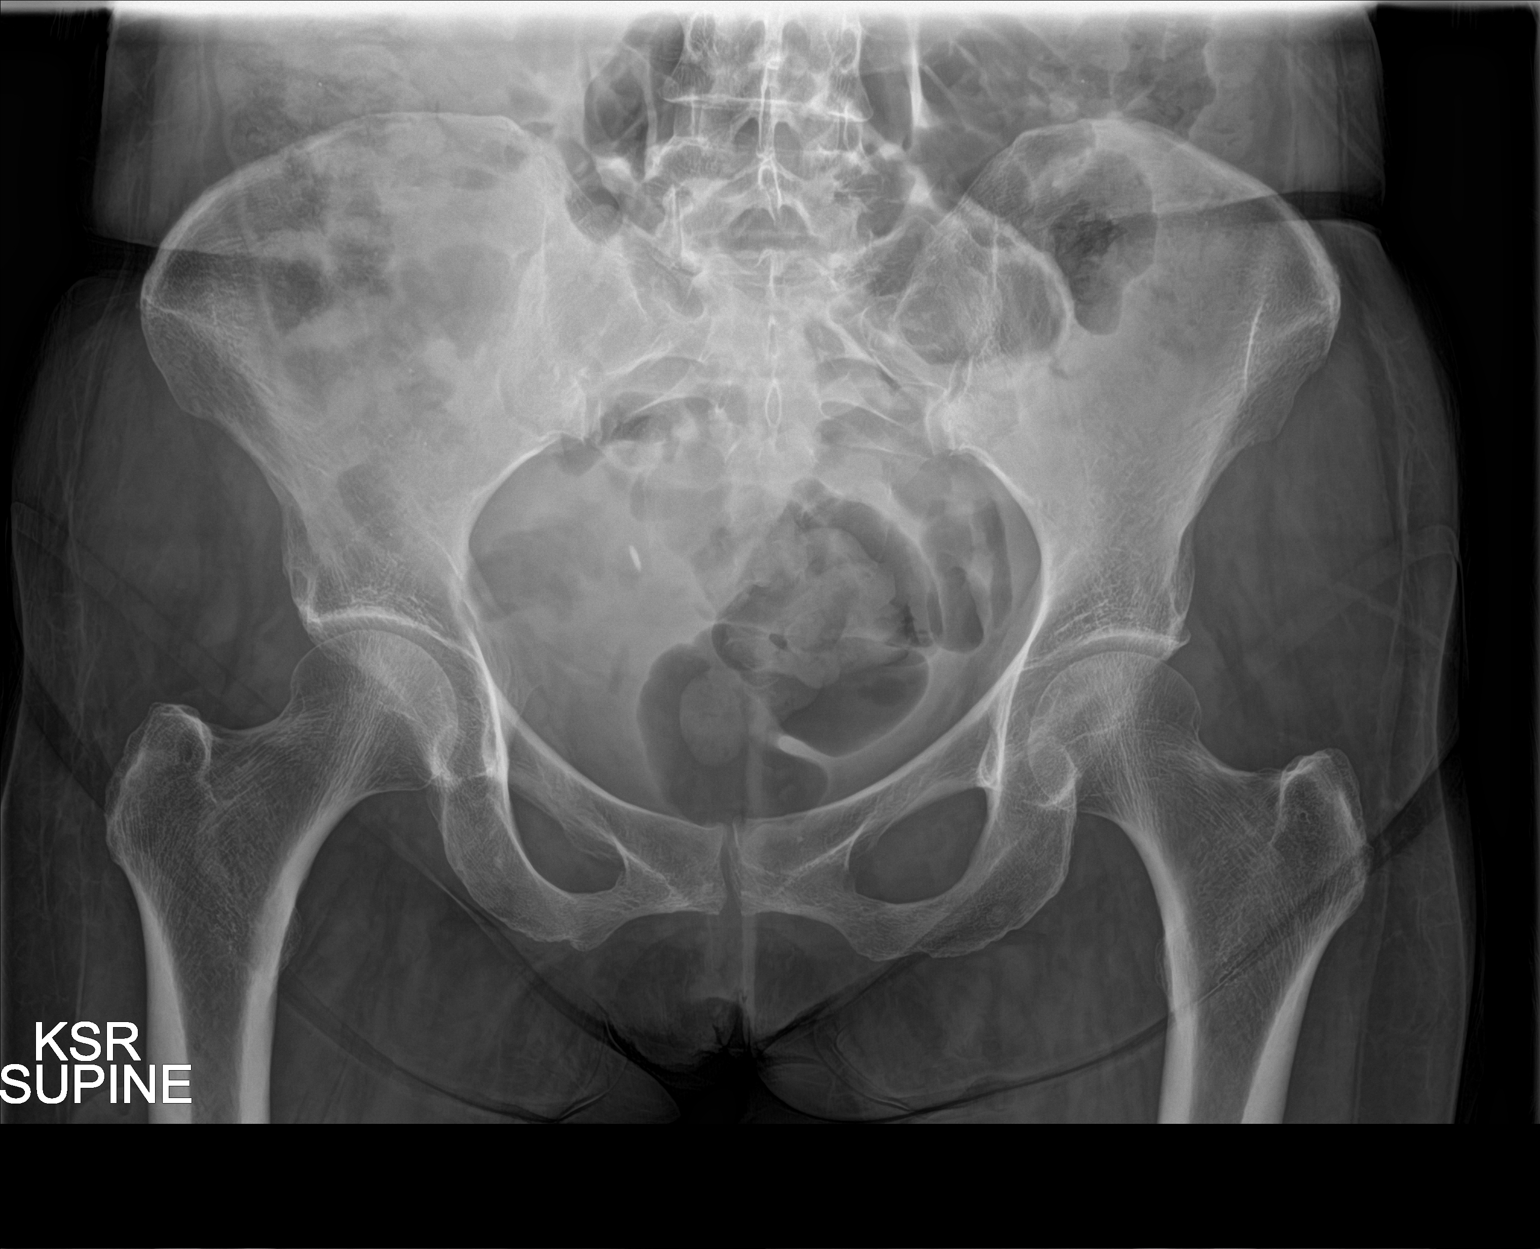

[1 of 1 positions shown; findings below may reference images not displayed]

FINDINGS: Femoral heads are located. Sacroiliac joints are symmetric. No focal
osseous lesion. No acute fracture.
IMPRESSION: No acute osseous abnormality.

## 2016-10-09 IMAGING — CT CT ABD-PELV W/ CM
1 of 3 series · 14 of 32 positions shown, 19 images · IV contrast (APPLIED)
Comparison: 12/21/2015

CLINICAL DATA: Worsening right upper quadrant pain for 2-3 weeks.
Previous hysterectomy for endometrial carcinoma.

EXAM:
CT ABDOMEN AND PELVIS WITH CONTRAST
TECHNIQUE: Multidetector CT imaging of the abdomen and pelvis was performed
using the standard protocol following bolus administration of
intravenous contrast.
CONTRAST:  85mL [id number] IOPAMIDOL ([id number]) INJECTION 61%

[Series 2: axial st · axial · 0.65mm/px · z∈[-1006,-611]mm · 14 of 89 slices shown, 19 images]
[im 5/89  soft-tissue]
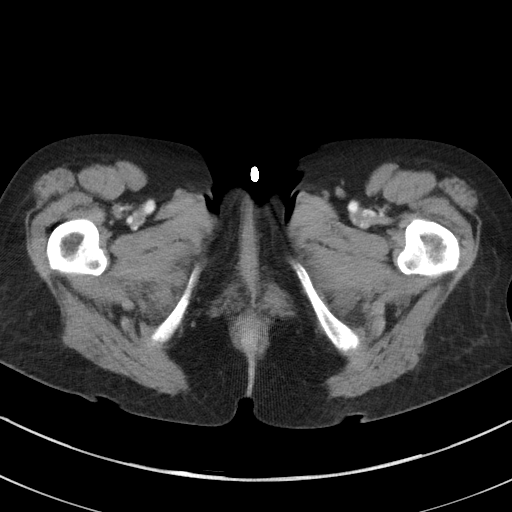
[im 5/89  bone]
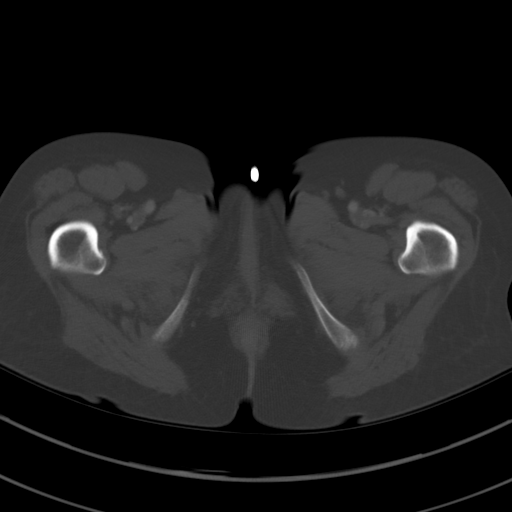
[im 14/89  soft-tissue]
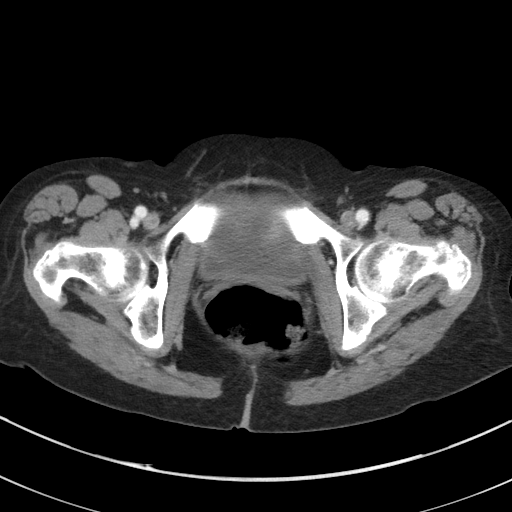
[im 19/89  soft-tissue]
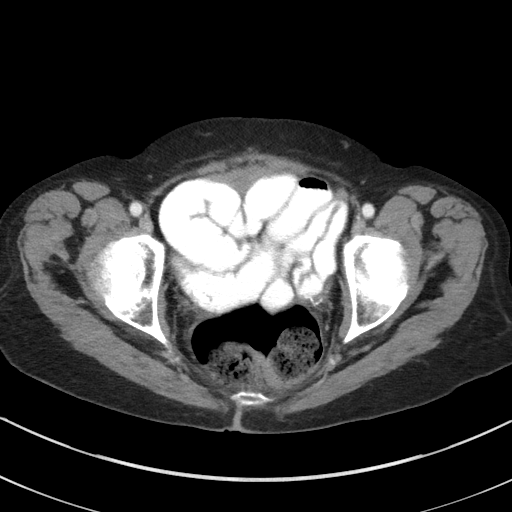
[im 24/89  soft-tissue]
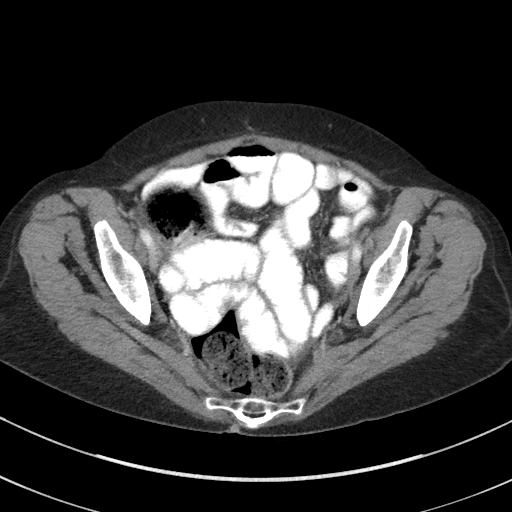
[im 33/89  soft-tissue]
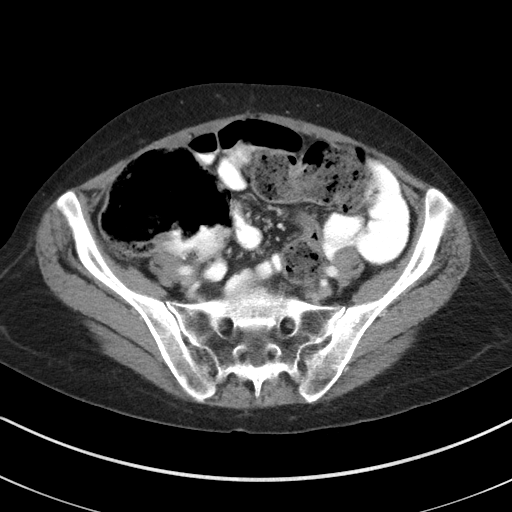
[im 38/89  soft-tissue]
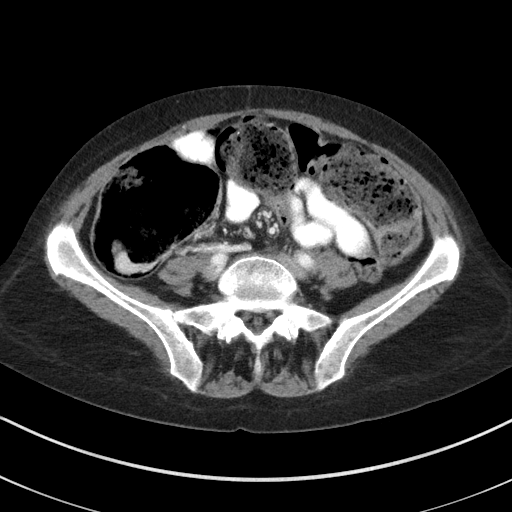
[im 47/89  soft-tissue]
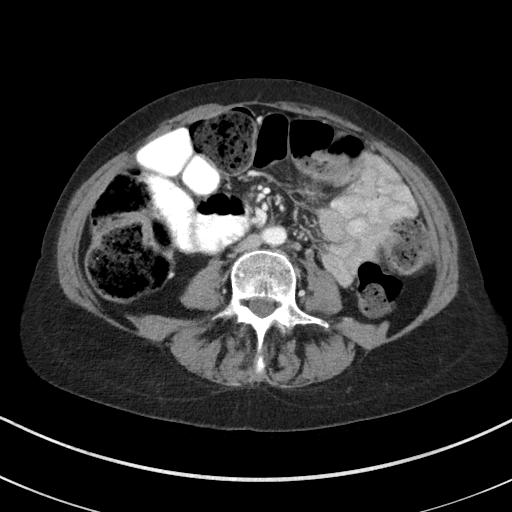
[im 51/89  soft-tissue]
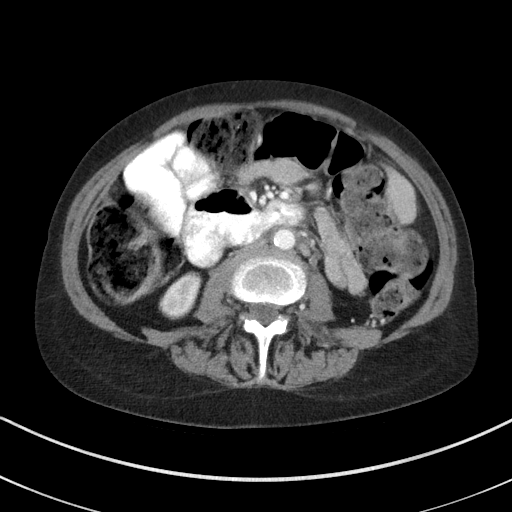
[im 56/89  soft-tissue]
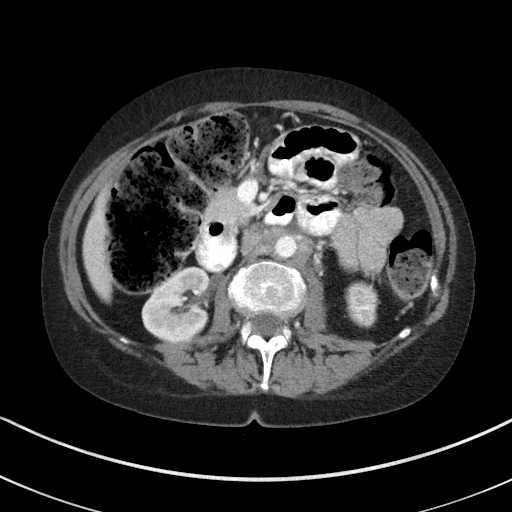
[im 56/89  bone]
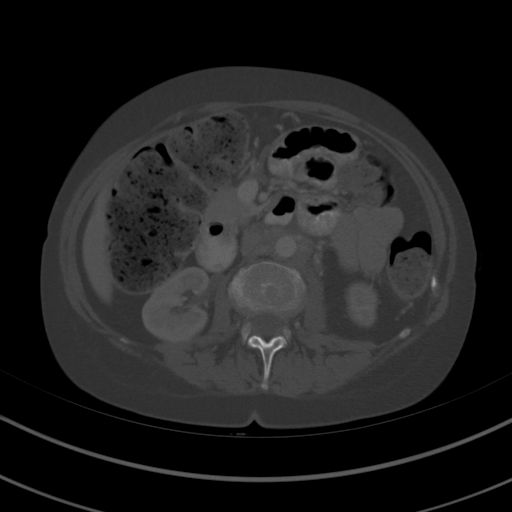
[im 65/89  soft-tissue]
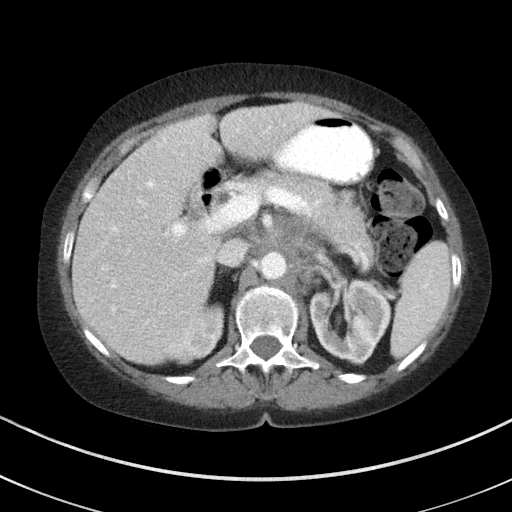
[im 70/89  soft-tissue]
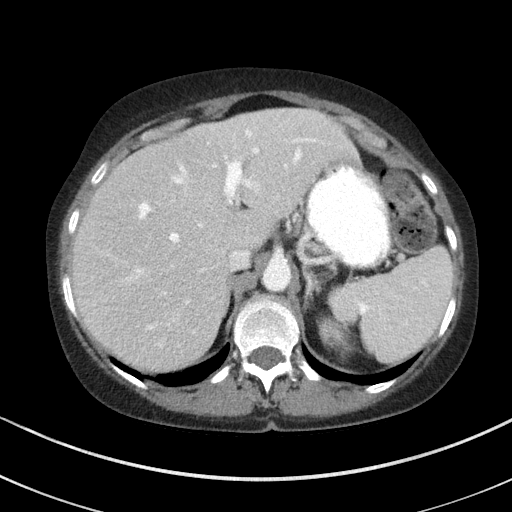
[im 70/89  lung]
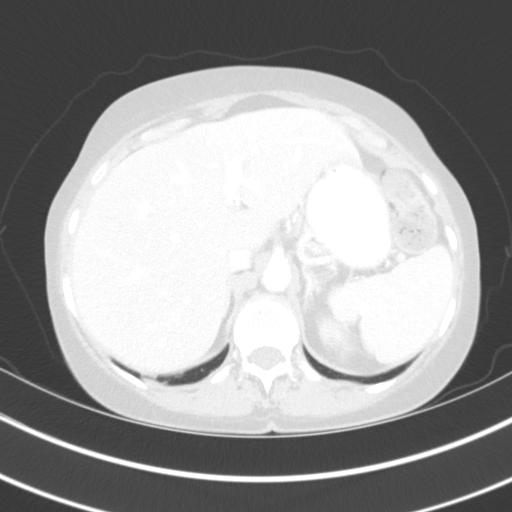
[im 75/89  soft-tissue]
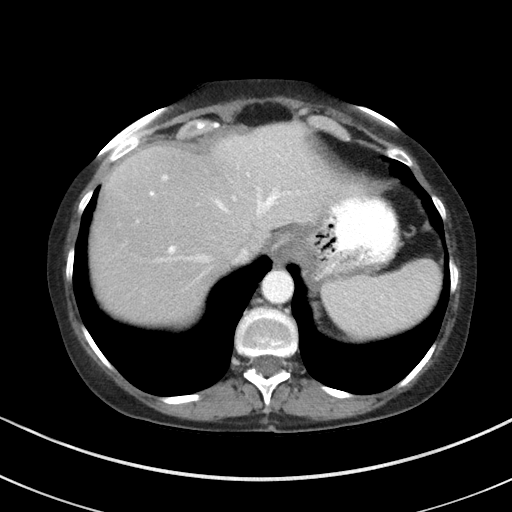
[im 75/89  lung]
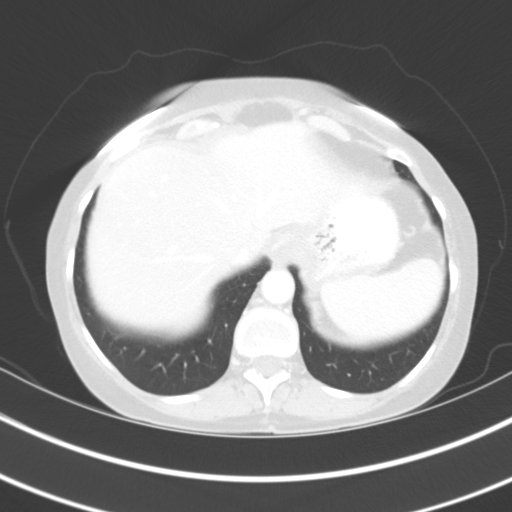
[im 79/89  lung]
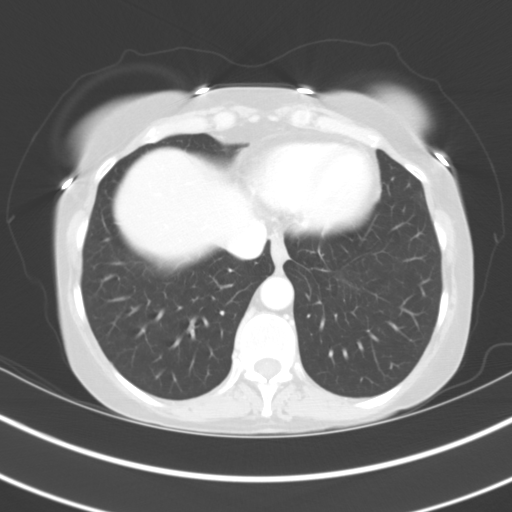
[im 84/89  soft-tissue]
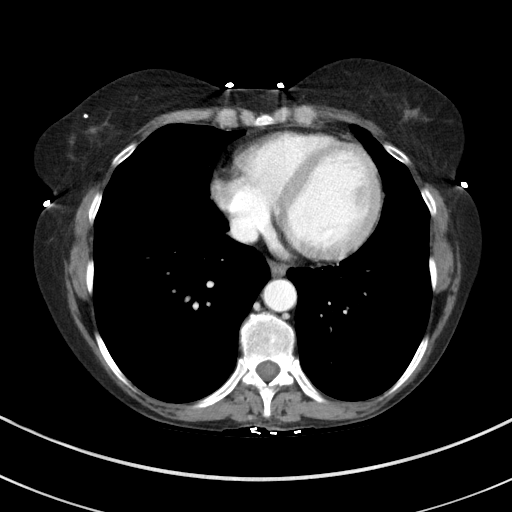
[im 84/89  lung]
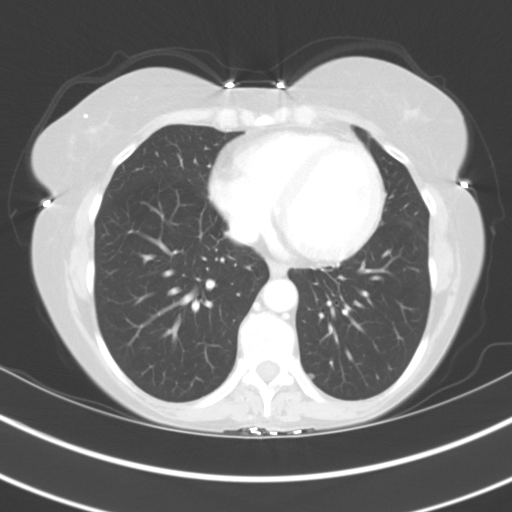

[14 of 32 positions shown; findings below may reference images not displayed]

FINDINGS: Lower chest:  No acute findings.

Hepatobiliary: No masses or other significant abnormality.
Gallbladder is unremarkable.

Pancreas: No mass, inflammatory changes, or other significant
abnormality.

Spleen: Within normal limits in size and appearance.

Adrenals/Urinary Tract: No masses identified. No evidence of
hydronephrosis.

Stomach/Bowel: No evidence of obstruction, inflammatory process, or
abnormal fluid collections. Diffuse colonic distention and large
stool burden seen which is increased since previous study.

Vascular/Lymphatic: Bulky abdominal retroperitoneal lymphadenopathy
is seen encasing the abdominal aorta and renal vessels. This
measures 5.8 x 6.6 cm on image 28/series 2 compared with 5.0 x
cm when measured in similar fashion on previous study. No pelvic
lymphadenopathy identified. No evidence of abdominal aortic
aneurysm.

Reproductive: Prior hysterectomy noted. Adnexal regions are
unremarkable in appearance.

Other: None.

Musculoskeletal: Mixed lytic and sclerotic bone lesion in the
anterior L2 vertebral body appears stable. Bone metastasis cannot be
excluded.
IMPRESSION: Mild increase in bulky abdominal retroperitoneal lymphadenopathy,
consistent with progressive metastatic disease.

Stable mixed lytic and sclerotic bone lesion in the anterior L2
vertebral body. Bone metastasis cannot be excluded.

Diffuse colonic distention with large stool burden ; recommend
clinical correlation for possible constipation.

## 2016-11-13 ENCOUNTER — Other Ambulatory Visit: Payer: Self-pay | Admitting: Nurse Practitioner
